# Patient Record
Sex: Male | Born: 1960 | Race: Black or African American | Hispanic: No | Marital: Single | Smoking: Current every day smoker
Health system: Southern US, Community
[De-identification: ages and names within clinical notes are randomized; demographics above are authoritative.]

## PROBLEM LIST (undated history)

## (undated) DIAGNOSIS — J449 Chronic obstructive pulmonary disease, unspecified: Secondary | ICD-10-CM

## (undated) DIAGNOSIS — J45909 Unspecified asthma, uncomplicated: Secondary | ICD-10-CM

## (undated) DIAGNOSIS — M1990 Unspecified osteoarthritis, unspecified site: Secondary | ICD-10-CM

## (undated) DIAGNOSIS — J189 Pneumonia, unspecified organism: Secondary | ICD-10-CM

## (undated) HISTORY — PX: NO PAST SURGERIES: SHX2092

---

## 2013-10-09 ENCOUNTER — Emergency Department: Payer: Self-pay | Admitting: Emergency Medicine

## 2014-12-12 ENCOUNTER — Encounter: Payer: Self-pay | Admitting: Emergency Medicine

## 2014-12-12 ENCOUNTER — Emergency Department
Admission: EM | Admit: 2014-12-12 | Discharge: 2014-12-12 | Disposition: A | Discharge status: Home / Self Care | Payer: Self-pay | Attending: Emergency Medicine | Admitting: Emergency Medicine

## 2014-12-12 DIAGNOSIS — Z720 Tobacco use: Secondary | ICD-10-CM | POA: Insufficient documentation

## 2014-12-12 DIAGNOSIS — M545 Low back pain, unspecified: Secondary | ICD-10-CM

## 2014-12-12 MED ORDER — KETOROLAC TROMETHAMINE 30 MG/ML IJ SOLN
30.0000 mg | Freq: Once | INTRAMUSCULAR | Status: AC
  Administered 2014-12-12: 30 mg via INTRAMUSCULAR

## 2014-12-12 MED ORDER — NAPROXEN 500 MG PO TABS: 500.0000 mg | ORAL_TABLET | Freq: Two times a day (BID) | ORAL | Status: DC

## 2014-12-12 MED ORDER — KETOROLAC TROMETHAMINE 30 MG/ML IJ SOLN
INTRAMUSCULAR | Status: AC
  Administered 2014-12-12: 30 mg via INTRAMUSCULAR
  Filled 2014-12-12: qty 1

## 2014-12-12 MED ORDER — PREDNISONE 50 MG PO TABS: 50.0000 mg | ORAL_TABLET | Freq: Every day | ORAL | Status: DC

## 2014-12-12 NOTE — Discharge Instructions (Signed)
Back Pain, Adult Low back pain is very common. About 1 in 5 people have back pain.The cause of low back pain is rarely dangerous. The pain often gets better over time.About half of people with a sudden onset of back pain feel better in just 2 weeks. About 8 in 10 people feel better by 6 weeks.  CAUSES Some common causes of back pain include:  Strain of the muscles or ligaments supporting the spine.  Wear and tear (degeneration) of the spinal discs.  Arthritis.  Direct injury to the back. DIAGNOSIS Most of the time, the direct cause of low back pain is not known.However, back pain can be treated effectively even when the exact cause of the pain is unknown.Answering your caregiver's questions about your overall health and symptoms is one of the most accurate ways to make sure the cause of your pain is not dangerous. If your caregiver needs more information, he or she may order lab work or imaging tests (X-rays or MRIs).However, even if imaging tests show changes in your back, this usually does not require surgery. HOME CARE INSTRUCTIONS For many people, back pain returns.Since low back pain is rarely dangerous, it is often a condition that people can learn to manageon their own.   Remain active. It is stressful on the back to sit or stand in one place. Do not sit, drive, or stand in one place for more than 30 minutes at a time. Take short walks on level surfaces as soon as pain allows.Try to increase the length of time you walk each day.  Do not stay in bed.Resting more than 1 or 2 days can delay your recovery.  Do not avoid exercise or work.Your body is made to move.It is not dangerous to be active, even though your back may hurt.Your back will likely heal faster if you return to being active before your pain is gone.  Pay attention to your body when you bend and lift. Many people have less discomfortwhen lifting if they bend their knees, keep the load close to their bodies,and  avoid twisting. Often, the most comfortable positions are those that put less stress on your recovering back.  Find a comfortable position to sleep. Use a firm mattress and lie on your side with your knees slightly bent. If you lie on your back, put a pillow under your knees.  Only take over-the-counter or prescription medicines as directed by your caregiver. Over-the-counter medicines to reduce pain and inflammation are often the most helpful.Your caregiver may prescribe muscle relaxant drugs.These medicines help dull your pain so you can more quickly return to your normal activities and healthy exercise.  Put ice on the injured area.  Put ice in a plastic bag.  Place a towel between your skin and the bag.  Leave the ice on for 15-20 minutes, 03-04 times a day for the first 2 to 3 days. After that, ice and heat may be alternated to reduce pain and spasms.  Ask your caregiver about trying back exercises and gentle massage. This may be of some benefit.  Avoid feeling anxious or stressed.Stress increases muscle tension and can worsen back pain.It is important to recognize when you are anxious or stressed and learn ways to manage it.Exercise is a great option. SEEK MEDICAL CARE IF:  You have pain that is not relieved with rest or medicine.  You have pain that does not improve in 1 week.  You have new symptoms.  You are generally not feeling well. SEEK   IMMEDIATE MEDICAL CARE IF:   You have pain that radiates from your back into your legs.  You develop new bowel or bladder control problems.  You have unusual weakness or numbness in your arms or legs.  You develop nausea or vomiting.  You develop abdominal pain.  You feel faint. Document Released: 04/28/2005 Document Revised: 10/28/2011 Document Reviewed: 08/30/2013 ExitCare Patient Information 2015 [employer]. This information is not intended to replace advice given to you by your health care provider. Make sure you  discuss any questions you have with your health care provider.  

## 2014-12-12 NOTE — ED Notes (Signed)
Developed lower back strain at work  [employer] of injury  Ambulates well to treatment room  Dr Corky Downs in with pt on arrival

## 2014-12-12 NOTE — ED Provider Notes (Signed)
[facility] Emergency Department Provider Note  ____________________________________________  Time seen: On arrival  I have reviewed the triage vital signs and the nursing notes.   HISTORY  Chief Complaint Back Pain    HPI Edward Macias is a 54 y.o. male who presents with back pain. 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 He reports the pain is moderate and worse with standing. The pain is in the right lower back just adjacent to the spine. No numbness no weakness. No fevers no chills. No difficult urinating.    History reviewed. No pertinent past medical history.  There are no active problems to display for this patient.   History reviewed. No pertinent past surgical history.  Current Outpatient Rx  Name  Route  Sig  Dispense  Refill  . naproxen (NAPROSYN) 500 MG tablet   Oral   Take 1 tablet (500 mg total) by mouth 2 (two) times daily with a meal.   20 tablet   2   . predniSONE (DELTASONE) 50 MG tablet   Oral   Take 1 tablet (50 mg total) by mouth daily with breakfast.   5 tablet   0     Allergies Review of patient's allergies indicates not on file.  No family history on file.  Social History History  Substance Use Topics  . Smoking status: Current Every Day Smoker -- 1.00 packs/day    Types: Cigarettes  . Smokeless tobacco: Not on file  . Alcohol Use: Yes     Comment: occasionally    Review of Systems  Constitutional: Negative for fever. Eyes: Negative for visual changes.  Genitourinary: Negative for difficulty urinating Musculoskeletal: Positive for back pain Skin: Negative for rash. Neurological: Negative for headaches or focal weakness   ____________________________________________   PHYSICAL EXAM:  VITAL SIGNS: ED Triage Vitals  Enc Vitals Group     BP 12/12/14 0743 114/72 mmHg     Pulse Rate 12/12/14 0743 99     Resp 12/12/14 0743 18     Temp  12/12/14 0743 97.9 F (36.6 C)     Temp Source 12/12/14 0743 Oral     SpO2 12/12/14 0743 96 %     Weight 12/12/14 0739 150 lb (68.04 kg)     Height 12/12/14 0739 5\' 9"  (1.753 m)     Head Cir --      Peak Flow --      Pain Score 12/12/14 0739 8     Pain Loc --      Pain Edu? --      Excl. in [place]? --      Constitutional: Alert and oriented. Well appearing and in no distress. Eyes: Conjunctivae are normal.  ENT   Head: Normocephalic and atraumatic.   Mouth/Throat: Mucous membranes are moist. Cardiovascular: Normal rate, regular rhythm.  Respiratory: Normal respiratory effort without tachypnea nor retractions.  Gastrointestinal: Soft and non-tender in all quadrants. No distention. There is no CVA tenderness. Musculoskeletal: Nontender with normal range of motion in all extremities. No vertebral tenderness to palpation. No erythema or swelling. No muscle spasms. Pain with flexion the patient is able to range his spine completely. Neurologic:  Normal speech and language. Normal strength in the lower extremities. Sensation grossly intact Skin:  Skin is warm, dry and intact. No rash noted. Psychiatric: Mood and affect are normal. Patient exhibits appropriate insight and judgment.  ____________________________________________    LABS (pertinent positives/negatives)  Labs Reviewed - No data to display  ____________________________________________     ____________________________________________    RADIOLOGY I have personally reviewed any xrays that were ordered on this patient: None  ____________________________________________   PROCEDURES  Procedure(s) performed: none   ____________________________________________   INITIAL IMPRESSION / ASSESSMENT AND PLAN / ED COURSE  Pertinent labs & imaging results that were available during my care of the patient were reviewed by me and considered in my medical decision making (see chart for details).  Patient with  likely musculoskeletal back pain. We Edward treat with NSAIDs and a course of steroids. Patient to follow-up with his PCP  ____________________________________________   FINAL CLINICAL IMPRESSION(S) / ED DIAGNOSES  Final diagnoses:  Right-sided low back pain without sciatica     Lavonia Drafts, MD 12/12/14 [id number]

## 2014-12-12 NOTE — ED Notes (Signed)
Patient presents to the ED with lower back pain.  Patient is ambulatory to triage.  Patient states he may have strained his back at work and he is unsure of when and he did not report the injury to management.  Patient states periods of rest increase the back pain.

## 2014-12-13 ENCOUNTER — Ambulatory Visit
Admission: EM | Admit: 2014-12-13 | Discharge: 2014-12-13 | Disposition: A | Discharge status: Home / Self Care | Payer: Worker's Compensation | Attending: Family Medicine | Admitting: Family Medicine

## 2014-12-13 DIAGNOSIS — S39012A Strain of muscle, fascia and tendon of lower back, initial encounter: Secondary | ICD-10-CM

## 2014-12-13 MED ORDER — CYCLOBENZAPRINE HCL 10 MG PO TABS: ORAL_TABLET | ORAL | Status: DC

## 2014-12-13 NOTE — ED Notes (Signed)
Pt states "I have right low back pain since Thursday. 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

## 2014-12-13 NOTE — ED Provider Notes (Signed)
CSN: [id number]     Arrival date & time 12/13/14  1043 History   First MD Initiated Contact with Patient 12/13/14 1151     Chief Complaint  Patient presents with  . Back Pain   (Consider location/radiation/quality/duration/timing/severity/associated sxs/prior Treatment) HPI   This a 54 year old gentleman who presents today because of back pain on his right paralumbar region does not radiate. 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 He states that the following morning didn't hurt badly but over the weekend seem to exacerbate and by Monday morning was unable to get out of bed. He was seen at the emergency department at [facility] yesterday given a Toradol injection and prescription for Naprosyn and prednisone. When he returned to work today with a restriction of no lifting greater than 25 pounds his employer stated that he must be seen here or evaluation before returning to work. He states that his pain is nonradiating. His had back injuries in the past that seemed to improve quickly without any further sequelae. He has filled the Naprosyn prescription but not the prednisone prescription provided yesterday. He states that no tests were obtained yesterday which was verified through medical records.  History reviewed. No pertinent past medical history. History reviewed. No pertinent past surgical history. History reviewed. No pertinent family history. History  Substance Use Topics  . Smoking status: Current Every Day Smoker -- 1.00 packs/day    Types: Cigarettes  . Smokeless tobacco: Not on file  . Alcohol Use: Yes     Comment: occasionally    Review of Systems  Musculoskeletal: Positive for back pain.  All other systems reviewed and are negative.   Allergies  Review of patient's allergies indicates no known allergies.  Home Medications   Prior to Admission medications    Medication Sig Start Date End Date Taking? Authorizing Provider  naproxen (NAPROSYN) 500 MG tablet Take 1 tablet (500 mg total) by mouth 2 (two) times daily with a meal. 12/12/14  Yes Lavonia Drafts, MD  predniSONE (DELTASONE) 50 MG tablet Take 1 tablet (50 mg total) by mouth daily with breakfast. 12/12/14  Yes Lavonia Drafts, MD  cyclobenzaprine (FLEXERIL) 10 MG tablet To be taken at bedtime. 12/13/14   Malachy Chamber Ewelina Naves, PA-C   BP 128/91 mmHg  Pulse 68  Temp(Src) 98 F (36.7 C) (Oral)  Resp 16  Ht 5\' 9"  (1.753 m)  Wt 150 lb (68.04 kg)  BMI 22.14 kg/m2  SpO2 99% Physical Exam  Constitutional: He is oriented to person, place, and time. He appears well-developed and well-nourished.  HENT:  Head: Normocephalic and atraumatic.  Eyes: Pupils are equal, round, and reactive to light.  Neck: Neck supple.  Musculoskeletal:  Patient is a level pelvis in stance. Lumbar range of motion is full with observable muscle spasm in the right paraspinous muscles with right lateral bending. Patient is able to toe and heel walk. EHL peroneal and anterior tibialis is strong to clinical testing. Sensation is intact to the lower extremities bilaterally. Straight leg raise testing is negative in the sitting position to 90 bilaterally DTRs are 2+ over 4 bilaterally in the knee and ankle. There is no CVA tenderness present  Neurological: He is alert and oriented to person, place, and time.  Skin: Skin is warm and dry.  Psychiatric: He has a normal mood and affect. His behavior is normal.  Judgment and thought content normal.  Nursing note and vitals reviewed.   ED Course  Procedures (including critical care time) Labs Review Labs Reviewed - No data to display  Imaging Review No results found.   MDM   1. Strain of lumbar paraspinous muscle, initial encounter    New Prescriptions   CYCLOBENZAPRINE (FLEXERIL) 10 MG TABLET    To be taken at bedtime.   I had a discussion with the patient and agree with the  findings performed by the physician at [facility] emergency department. I've told patient that he should pick up the prescription for the prednisone and take that as prescribed. He'll continue to take his Naprosyn as prescribed. I also added some Flexeril that he can take at nighttime. 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 I'll continue his restrictions of not lifting greater than 25 pounds at work for a total of 2 weeks to allow him to recover from his injury. 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 No formal schedule follow-up will be arranged but he may return any time. He continues to have problems consideration for physical therapy may be beneficial.   Lorin Picket, PA-C 12/13/14 1245

## 2014-12-13 NOTE — Discharge Instructions (Signed)
Back Pain, Adult Low back pain is very common. About 1 in 5 people have back pain.The cause of low back pain is rarely dangerous. The pain often gets better over time.About half of people with a sudden onset of back pain feel better in just 2 weeks. About 8 in 10 people feel better by 6 weeks.  CAUSES Some common causes of back pain include:  Strain of the muscles or ligaments supporting the spine.  Wear and tear (degeneration) of the spinal discs.  Arthritis.  Direct injury to the back. DIAGNOSIS Most of the time, the direct cause of low back pain is not known.However, back pain can be treated effectively even when the exact cause of the pain is unknown.Answering your caregiver's questions about your overall health and symptoms is one of the most accurate ways to make sure the cause of your pain is not dangerous. If your caregiver needs more information, he or she may order lab work or imaging tests (X-rays or MRIs).However, even if imaging tests show changes in your back, this usually does not require surgery. HOME CARE INSTRUCTIONS For many people, back pain returns.Since low back pain is rarely dangerous, it is often a condition that people can learn to manageon their own.   Remain active. It is stressful on the back to sit or stand in one place. Do not sit, drive, or stand in one place for more than 30 minutes at a time. Take short walks on level surfaces as soon as pain allows.Try to increase the length of time you walk each day.  Do not stay in bed.Resting more than 1 or 2 days can delay your recovery.  Do not avoid exercise or work.Your body is made to move.It is not dangerous to be active, even though your back may hurt.Your back will likely heal faster if you return to being active before your pain is gone.  Pay attention to your body when you bend and lift. Many people have less discomfortwhen lifting if they bend their knees, keep the load close to their bodies,and  avoid twisting. Often, the most comfortable positions are those that put less stress on your recovering back.  Find a comfortable position to sleep. Use a firm mattress and lie on your side with your knees slightly bent. If you lie on your back, put a pillow under your knees.  Only take over-the-counter or prescription medicines as directed by your caregiver. Over-the-counter medicines to reduce pain and inflammation are often the most helpful.Your caregiver may prescribe muscle relaxant drugs.These medicines help dull your pain so you can more quickly return to your normal activities and healthy exercise.  Put ice on the injured area.  Put ice in a plastic bag.  Place a towel between your skin and the bag.  Leave the ice on for 15-20 minutes, 03-04 times a day for the first 2 to 3 days. After that, ice and heat may be alternated to reduce pain and spasms.  Ask your caregiver about trying back exercises and gentle massage. This may be of some benefit.  Avoid feeling anxious or stressed.Stress increases muscle tension and can worsen back pain.It is important to recognize when you are anxious or stressed and learn ways to manage it.Exercise is a great option. SEEK MEDICAL CARE IF:  You have pain that is not relieved with rest or medicine.  You have pain that does not improve in 1 week.  You have new symptoms.  You are generally not feeling well. SEEK   IMMEDIATE MEDICAL CARE IF:   You have pain that radiates from your back into your legs.  You develop new bowel or bladder control problems.  You have unusual weakness or numbness in your arms or legs.  You develop nausea or vomiting.  You develop abdominal pain.  You feel faint. Document Released: 04/28/2005 Document Revised: 10/28/2011 Document Reviewed: 08/30/2013 ExitCare Patient Information 2015 [employer]. This information is not intended to replace advice given to you by your health care provider. Make sure you  discuss any questions you have with your health care provider.  

## 2014-12-27 ENCOUNTER — Emergency Department
Admission: EM | Admit: 2014-12-27 | Discharge: 2014-12-27 | Disposition: A | Discharge status: Home / Self Care | Payer: Self-pay | Attending: Emergency Medicine | Admitting: Emergency Medicine

## 2014-12-27 ENCOUNTER — Emergency Department: Payer: Self-pay

## 2014-12-27 ENCOUNTER — Encounter: Payer: Self-pay | Admitting: Emergency Medicine

## 2014-12-27 DIAGNOSIS — Z720 Tobacco use: Secondary | ICD-10-CM | POA: Insufficient documentation

## 2014-12-27 DIAGNOSIS — S300XXA Contusion of lower back and pelvis, initial encounter: Secondary | ICD-10-CM | POA: Insufficient documentation

## 2014-12-27 DIAGNOSIS — Y9289 Other specified places as the place of occurrence of the external cause: Secondary | ICD-10-CM | POA: Insufficient documentation

## 2014-12-27 DIAGNOSIS — S79912A Unspecified injury of left hip, initial encounter: Secondary | ICD-10-CM | POA: Insufficient documentation

## 2014-12-27 DIAGNOSIS — Y998 Other external cause status: Secondary | ICD-10-CM | POA: Insufficient documentation

## 2014-12-27 DIAGNOSIS — W208XXA Other cause of strike by thrown, projected or falling object, initial encounter: Secondary | ICD-10-CM | POA: Insufficient documentation

## 2014-12-27 DIAGNOSIS — Y9389 Activity, other specified: Secondary | ICD-10-CM | POA: Insufficient documentation

## 2014-12-27 MED ORDER — KETOROLAC TROMETHAMINE 60 MG/2ML IM SOLN
60.0000 mg | Freq: Once | INTRAMUSCULAR | Status: AC
  Administered 2014-12-27: 60 mg via INTRAMUSCULAR
  Filled 2014-12-27: qty 2

## 2014-12-27 MED ORDER — METHOCARBAMOL 500 MG PO TABS: 500.0000 mg | ORAL_TABLET | Freq: Four times a day (QID) | ORAL | Status: DC | PRN

## 2014-12-27 MED ORDER — IBUPROFEN 800 MG PO TABS: 800.0000 mg | ORAL_TABLET | Freq: Three times a day (TID) | ORAL | Status: DC | PRN

## 2014-12-27 MED ORDER — HYDROCODONE-ACETAMINOPHEN 5-325 MG PO TABS: 1.0000 | ORAL_TABLET | ORAL | Status: DC | PRN

## 2014-12-27 NOTE — ED Provider Notes (Signed)
[facility] Emergency Department Provider Note  ____________________________________________  Time seen: Approximately 3:49 PM  I have reviewed the triage vital signs and the nursing notes.   HISTORY  Chief Complaint Back Pain    HPI Edward Macias is a 54 y.o. male presents via EMS for complaints of left hip and low back pain. Patient states that he was unloading a right lumbar from A pickup truck when the lawnmower fell on top of him. Denies any abdominal pain no chest pain or shortness or difficulty breathing. Able to stand and ambulate but does so with difficulty.   History reviewed. No pertinent past medical history.  There are no active problems to display for this patient.   History reviewed. No pertinent past surgical history.  Current Outpatient Rx  Name  Route  Sig  Dispense  Refill  . HYDROcodone-acetaminophen (NORCO) 5-325 MG per tablet   Oral   Take 1-2 tablets by mouth every 4 (four) hours as needed for moderate pain.   8 tablet   0   . ibuprofen (ADVIL,MOTRIN) 800 MG tablet   Oral   Take 1 tablet (800 mg total) by mouth every 8 (eight) hours as needed.   30 tablet   0   . methocarbamol (ROBAXIN) 500 MG tablet   Oral   Take 1 tablet (500 mg total) by mouth every 6 (six) hours as needed for muscle spasms.   30 tablet   0     Allergies Review of patient's allergies indicates no known allergies.  History reviewed. No pertinent family history.  Social History Social History  Substance Use Topics  . Smoking status: Current Every Day Smoker -- 1.00 packs/day    Types: Cigarettes  . Smokeless tobacco: None  . Alcohol Use: Yes     Comment: occasionally    Review of Systems Constitutional: No fever/chills Eyes: No visual changes. ENT: No sore throat. Cardiovascular: Denies chest pain. Respiratory: Denies shortness of breath. Gastrointestinal: No abdominal pain.  No nausea, no vomiting.  No diarrhea.  No  constipation. Genitourinary: Negative for dysuria. Musculoskeletal: Positive for left hip pain. Positive for low back sacral coccyx pain. Skin: Negative for rash. Neurological: Negative for headaches, focal weakness or numbness.  10-point ROS otherwise negative.  ____________________________________________   PHYSICAL EXAM:  VITAL SIGNS: ED Triage Vitals  Enc Vitals Group     BP --      Pulse --      Resp --      Temp --      Temp src --      SpO2 --      Weight --      Height --      Head Cir --      Peak Flow --      Pain Score --      Pain Loc --      Pain Edu? --      Excl. in [place]? --     Constitutional: Alert and oriented. Well appearing and in no acute distress. Eyes: Conjunctivae are normal. PERRL. EOMI. Head: Atraumatic. Nose: No congestion/rhinnorhea. Mouth/Throat: Mucous membranes are moist.  Oropharynx non-erythematous. Neck: No stridor.   Cardiovascular: Normal rate, regular rhythm. Grossly normal heart sounds.  Good peripheral circulation. Respiratory: Normal respiratory effort.  No retractions. Lungs CTAB. Gastrointestinal: Soft and nontender. No distention. No abdominal bruits. No CVA tenderness. Musculoskeletal: No lower extremity tenderness nor edema.  No joint effusions. Neurologic:  Normal speech and language. No gross  focal neurologic deficits are appreciated. No gait instability. Skin:  Skin is warm, dry and intact. No rash noted. Psychiatric: Mood and affect are normal. Speech and behavior are normal.  ____________________________________________   LABS (all labs ordered are listed, but only abnormal results are displayed)  Labs Reviewed - No data to display  RADIOLOGY  Left hip and pelvis and sacrum coccyx x-rays interpreted by radiologist and reviewed by myself all reported as negative. ____________________________________________   PROCEDURES  Procedure(s) performed: None  Critical Care performed:  No  ____________________________________________   INITIAL IMPRESSION / ASSESSMENT AND PLAN / ED COURSE  Pertinent labs & imaging results that were available during my care of the patient were reviewed by me and considered in my medical decision making (see chart for details).  Diagnosed with coccyx contusion. Rx given for Motrin 800 mg 3 times a day Norco No. 8 tablets and Robaxin as needed for muscle pain. Patient follow-up with PCP or return to the ER as needed. He voices no other emergency medical complaints at the time of discharge. ____________________________________________   FINAL CLINICAL IMPRESSION(S) / ED DIAGNOSES  Final diagnoses:  Coccyx contusion, initial encounter      Arlyss Repress, PA-C 12/27/14 [place], MD 12/27/14 [phone]

## 2014-12-27 NOTE — Discharge Instructions (Signed)
Tailbone Injury  The tailbone (coccyx) is the small bone at the lower end of the spine. A tailbone injury may involve stretched ligaments, bruising, or a broken bone (fracture). Women are more vulnerable to this injury due to having a wider pelvis.  CAUSES   This type of injury typically occurs from falling and landing on the tailbone. Repeated strain or friction from actions such as rowing and bicycling may also injure the area. The tailbone can be injured during childbirth. Infections or tumors may also press on the tailbone and cause pain. Sometimes, the cause of injury is unknown.  SYMPTOMS    Bruising.   Pain when sitting.   Painful bowel movements.   In women, pain during intercourse.  DIAGNOSIS   Your caregiver can diagnose a tailbone injury based on your symptoms and a physical exam. X-rays may be taken if a fracture is suspected. Your caregiver may also use an MRI scan imaging test to evaluate your symptoms.  TREATMENT   Your caregiver may prescribe medicines to help relieve your pain. Most tailbone injuries heal on their own in 4 to 6 weeks. However, if the injury is caused by an infection or tumor, the recovery period may vary.  PREVENTION   Wear appropriate padding and sports gear when bicycling and rowing. This can help prevent an injury from repeated strain or friction.  HOME CARE INSTRUCTIONS    Put ice on the injured area.   Put ice in a plastic bag.   Place a towel between your skin and the bag.   Leave the ice on for 15-20 minutes, every hour while awake for the first 1 to 2 days.   Sit on a large, rubber or inflated ring or cushion to ease your pain. Lean forward when sitting to help decrease discomfort.   Avoid sitting for long periods of time.   Increase your activity as the pain allows.   Only take over-the-counter or prescription medicines for pain, discomfort, or fever as directed by your caregiver.   You may use stool softeners if it is painful to have a bowel movement, or as  directed by your caregiver.   Eat a diet with plenty of fiber to help prevent constipation.   Keep all follow-up appointments as directed by your caregiver.  SEEK MEDICAL CARE IF:    Your pain becomes worse.   Your bowel movements cause a great deal of discomfort.   You are unable to have a bowel movement.   You have a fever.  MAKE SURE YOU:   Understand these instructions.   Will watch your condition.   Will get help right away if you are not doing well or get worse.  Document Released: 04/25/2000 Document Revised: 07/21/2011 Document Reviewed: 11/21/2010  ExitCare Patient Information 2015 [employer]. This information is not intended to replace advice given to you by your health care provider. Make sure you discuss any questions you have with your health care provider.

## 2014-12-27 NOTE — ED Notes (Signed)
Pt arrived via EMS from home, was unloading a riding lawnmower off the back of a pickup truck this morning around 0900-0930. Lawnmower started to fall and patient fell back onto his butt. He also thinks the back wheel hit him on his backside as well.  No other pain at this time.  Took Tylenol this morning without relief.  Ambulatory without assistance but c/o pain. Pain is 10/10.

## 2016-09-26 ENCOUNTER — Encounter
Admission: RE | Admit: 2016-09-26 | Discharge: 2016-09-26 | Disposition: A | Discharge status: Home / Self Care | Payer: Self-pay | Source: Ambulatory Visit | Attending: Surgery | Admitting: Surgery

## 2016-09-26 HISTORY — DX: Unspecified osteoarthritis, unspecified site: M19.90

## 2016-09-26 HISTORY — DX: Unspecified asthma, uncomplicated: J45.909

## 2016-09-26 NOTE — Patient Instructions (Signed)
  Your procedure is scheduled on: 10-03-16 Report to Same Day Surgery 2nd floor medical mall [facility] Entrance-take elevator on left to 2nd floor.  Check in with surgery information desk.) To find out your arrival time please call [phone] between 1PM - 3PM on 10-02-16  Remember: Instructions that are not followed completely may result in serious medical risk, up to and including death, or upon the discretion of your surgeon and anesthesiologist your surgery may need to be rescheduled.    _x___ 1. Do not eat food or drink liquids after midnight. No gum chewing or hard candies.     __x__ 2. No Alcohol for 24 hours before or after surgery.   __x__3. No Smoking for 24 prior to surgery.   ____  4. Bring all medications with you on the day of surgery if instructed.    __x__ 5. Notify your doctor if there is any change in your medical condition     (cold, fever, infections).     Do not wear jewelry, make-up, hairpins, clips or nail polish.  Do not wear lotions, powders, or perfumes. You may wear deodorant.  Do not shave 48 hours prior to surgery. Men may shave face and neck.  Do not bring valuables to the hospital.    [facility] is not responsible for any belongings or valuables.               Contacts, dentures or bridgework may not be worn into surgery.  Leave your suitcase in the car. After surgery it may be brought to your room.  For patients admitted to the hospital, discharge time is determined by your treatment team.   Patients discharged the day of surgery will not be allowed to drive home.  You will need someone to drive you home and stay with you the night of your procedure.    Please read over the following fact sheets that you were given:    ____ Take anti-hypertensive (unless it includes a diuretic), cardiac, seizure, asthma,     anti-reflux and psychiatric medicines. These include:  1. NONE  2.  3.  4.  5.  6.  ____Fleets enema or Magnesium Citrate as  directed.   ____ Use CHG Soap or sage wipes as directed on instruction sheet   ____ Use inhalers on the day of surgery and bring to hospital day of surgery  ____ Stop Metformin and Janumet 2 days prior to surgery.    ____ Take 1/2 of usual insulin dose the night before surgery and none on the morning     surgery.   ____ Follow recommendations from Cardiologist, Pulmonologist or PCP regarding stopping Aspirin, Coumadin, Pllavix ,Eliquis, Effient, or Pradaxa, and Pletal.  X____Stop Anti-inflammatories such as Advil, Aleve, IBUPROFEN, Motrin, Naproxen, Naprosyn, Goodies powders or aspirin products NOW-OK to take Tylenol .   ____ Stop supplements until after surgery   ____ Bring C-Pap to the hospital.

## 2016-10-02 MED ORDER — CEFAZOLIN SODIUM-DEXTROSE 2-4 GM/100ML-% IV SOLN
2.0000 g | Freq: Once | INTRAVENOUS | Status: AC
  Administered 2016-10-03: 2 g via INTRAVENOUS

## 2016-10-03 ENCOUNTER — Ambulatory Visit
Admission: RE | Admit: 2016-10-03 | Discharge: 2016-10-03 | Disposition: A | Discharge status: Home / Self Care | Payer: Self-pay | Source: Ambulatory Visit | Attending: Surgery | Admitting: Surgery

## 2016-10-03 ENCOUNTER — Ambulatory Visit: Payer: Self-pay | Admitting: Certified Registered Nurse Anesthetist

## 2016-10-03 ENCOUNTER — Encounter: Payer: Self-pay | Admitting: *Deleted

## 2016-10-03 ENCOUNTER — Encounter
Admission: RE | Disposition: A | Discharge status: Home / Self Care | Payer: Self-pay | Source: Ambulatory Visit | Attending: Surgery

## 2016-10-03 DIAGNOSIS — J45909 Unspecified asthma, uncomplicated: Secondary | ICD-10-CM | POA: Insufficient documentation

## 2016-10-03 DIAGNOSIS — D176 Benign lipomatous neoplasm of spermatic cord: Secondary | ICD-10-CM | POA: Insufficient documentation

## 2016-10-03 DIAGNOSIS — Z79899 Other long term (current) drug therapy: Secondary | ICD-10-CM | POA: Insufficient documentation

## 2016-10-03 DIAGNOSIS — M1990 Unspecified osteoarthritis, unspecified site: Secondary | ICD-10-CM | POA: Insufficient documentation

## 2016-10-03 DIAGNOSIS — K4090 Unilateral inguinal hernia, without obstruction or gangrene, not specified as recurrent: Secondary | ICD-10-CM | POA: Insufficient documentation

## 2016-10-03 HISTORY — PX: INGUINAL HERNIA REPAIR: SHX194

## 2016-10-03 SURGERY — REPAIR, HERNIA, INGUINAL, ADULT
Anesthesia: General | Site: Groin | Laterality: Left | Wound class: Clean

## 2016-10-03 MED ORDER — FAMOTIDINE 20 MG PO TABS
20.0000 mg | ORAL_TABLET | Freq: Once | ORAL | Status: AC
  Administered 2016-10-03: 20 mg via ORAL

## 2016-10-03 MED ORDER — HYDROCODONE-ACETAMINOPHEN 5-325 MG PO TABS: 1.0000 | ORAL_TABLET | ORAL | 0 refills | Status: DC | PRN

## 2016-10-03 MED ORDER — ROCURONIUM BROMIDE 100 MG/10ML IV SOLN
INTRAVENOUS | Status: DC | PRN
  Administered 2016-10-03: 50 mg via INTRAVENOUS

## 2016-10-03 MED ORDER — FENTANYL CITRATE (PF) 100 MCG/2ML IJ SOLN
INTRAMUSCULAR | Status: AC
  Filled 2016-10-03: qty 2

## 2016-10-03 MED ORDER — SUGAMMADEX SODIUM 200 MG/2ML IV SOLN
INTRAVENOUS | Status: DC | PRN
  Administered 2016-10-03: 150 mg via INTRAVENOUS

## 2016-10-03 MED ORDER — OXYCODONE HCL 5 MG PO TABS: 5.0000 mg | ORAL_TABLET | Freq: Once | ORAL | Status: DC | PRN

## 2016-10-03 MED ORDER — DEXAMETHASONE SODIUM PHOSPHATE 10 MG/ML IJ SOLN
INTRAMUSCULAR | Status: AC
  Filled 2016-10-03: qty 1

## 2016-10-03 MED ORDER — OXYCODONE HCL 5 MG/5ML PO SOLN: 5.0000 mg | Freq: Once | ORAL | Status: DC | PRN

## 2016-10-03 MED ORDER — MIDAZOLAM HCL 2 MG/2ML IJ SOLN
INTRAMUSCULAR | Status: DC | PRN
  Administered 2016-10-03: 2 mg via INTRAVENOUS

## 2016-10-03 MED ORDER — ONDANSETRON HCL 4 MG/2ML IJ SOLN
INTRAMUSCULAR | Status: AC
  Filled 2016-10-03: qty 2

## 2016-10-03 MED ORDER — ONDANSETRON HCL 4 MG/2ML IJ SOLN
INTRAMUSCULAR | Status: DC | PRN
  Administered 2016-10-03: 4 mg via INTRAVENOUS

## 2016-10-03 MED ORDER — LIDOCAINE HCL (PF) 2 % IJ SOLN
INTRAMUSCULAR | Status: AC
  Filled 2016-10-03: qty 2

## 2016-10-03 MED ORDER — CEFAZOLIN SODIUM-DEXTROSE 2-4 GM/100ML-% IV SOLN
INTRAVENOUS | Status: AC
  Filled 2016-10-03: qty 100

## 2016-10-03 MED ORDER — FAMOTIDINE 20 MG PO TABS
ORAL_TABLET | ORAL | Status: AC
  Administered 2016-10-03: 20 mg via ORAL
  Filled 2016-10-03: qty 1

## 2016-10-03 MED ORDER — LACTATED RINGERS IV SOLN
INTRAVENOUS | Status: DC
  Administered 2016-10-03: 11:00:00 via INTRAVENOUS

## 2016-10-03 MED ORDER — ROCURONIUM BROMIDE 50 MG/5ML IV SOLN
INTRAVENOUS | Status: AC
  Filled 2016-10-03: qty 1

## 2016-10-03 MED ORDER — EPHEDRINE SULFATE 50 MG/ML IJ SOLN
INTRAMUSCULAR | Status: AC
  Filled 2016-10-03: qty 1

## 2016-10-03 MED ORDER — SUGAMMADEX SODIUM 200 MG/2ML IV SOLN
INTRAVENOUS | Status: AC
  Filled 2016-10-03: qty 2

## 2016-10-03 MED ORDER — EPHEDRINE SULFATE 50 MG/ML IJ SOLN
INTRAMUSCULAR | Status: DC | PRN
  Administered 2016-10-03: 10 mg via INTRAVENOUS

## 2016-10-03 MED ORDER — LIDOCAINE HCL (CARDIAC) 20 MG/ML IV SOLN
INTRAVENOUS | Status: DC | PRN
  Administered 2016-10-03: 60 mg via INTRAVENOUS

## 2016-10-03 MED ORDER — DEXAMETHASONE SODIUM PHOSPHATE 10 MG/ML IJ SOLN
INTRAMUSCULAR | Status: DC | PRN
  Administered 2016-10-03: 6 mg via INTRAVENOUS

## 2016-10-03 MED ORDER — PROPOFOL 10 MG/ML IV BOLUS
INTRAVENOUS | Status: AC
  Filled 2016-10-03: qty 20

## 2016-10-03 MED ORDER — PROMETHAZINE HCL 25 MG/ML IJ SOLN: 6.2500 mg | INTRAMUSCULAR | Status: DC | PRN

## 2016-10-03 MED ORDER — FENTANYL CITRATE (PF) 100 MCG/2ML IJ SOLN
INTRAMUSCULAR | Status: DC | PRN
  Administered 2016-10-03 (×3): 50 ug via INTRAVENOUS

## 2016-10-03 MED ORDER — FENTANYL CITRATE (PF) 100 MCG/2ML IJ SOLN
INTRAMUSCULAR | Status: AC
  Administered 2016-10-03: 25 ug via INTRAVENOUS
  Filled 2016-10-03: qty 2

## 2016-10-03 MED ORDER — HYDROCODONE-ACETAMINOPHEN 5-325 MG PO TABS
ORAL_TABLET | ORAL | Status: AC
  Filled 2016-10-03: qty 1

## 2016-10-03 MED ORDER — MEPERIDINE HCL 50 MG/ML IJ SOLN: 6.2500 mg | INTRAMUSCULAR | Status: DC | PRN

## 2016-10-03 MED ORDER — MIDAZOLAM HCL 2 MG/2ML IJ SOLN
INTRAMUSCULAR | Status: AC
  Filled 2016-10-03: qty 2

## 2016-10-03 MED ORDER — FENTANYL CITRATE (PF) 100 MCG/2ML IJ SOLN
25.0000 ug | INTRAMUSCULAR | Status: DC | PRN
  Administered 2016-10-03 (×4): 25 ug via INTRAVENOUS

## 2016-10-03 MED ORDER — BUPIVACAINE-EPINEPHRINE (PF) 0.5% -1:200000 IJ SOLN
INTRAMUSCULAR | Status: DC | PRN
  Administered 2016-10-03: 15 mL via PERINEURAL

## 2016-10-03 MED ORDER — HYDROCODONE-ACETAMINOPHEN 5-325 MG PO TABS
1.0000 | ORAL_TABLET | ORAL | Status: DC | PRN
  Administered 2016-10-03: 1 via ORAL

## 2016-10-03 MED ORDER — PROPOFOL 10 MG/ML IV BOLUS
INTRAVENOUS | Status: DC | PRN
  Administered 2016-10-03: 130 mg via INTRAVENOUS

## 2016-10-03 SURGICAL SUPPLY — 26 items
BLADE SURG 15 STRL LF DISP TIS (BLADE) ×1 IMPLANT
BLADE SURG 15 STRL SS (BLADE) ×2
CANISTER SUCT 1200ML W/VALVE (MISCELLANEOUS) ×3 IMPLANT
CHLORAPREP W/TINT 26ML (MISCELLANEOUS) ×3 IMPLANT
DERMABOND ADVANCED (GAUZE/BANDAGES/DRESSINGS) ×2
DERMABOND ADVANCED .7 DNX12 (GAUZE/BANDAGES/DRESSINGS) ×1 IMPLANT
DRAIN PENROSE 5/8X18 LTX STRL (WOUND CARE) ×3 IMPLANT
DRAPE LAPAROTOMY 77X122 PED (DRAPES) ×3 IMPLANT
ELECT REM PT RETURN 9FT ADLT (ELECTROSURGICAL) ×3
ELECTRODE REM PT RTRN 9FT ADLT (ELECTROSURGICAL) ×1 IMPLANT
GLOVE BIO SURGEON STRL SZ7.5 (GLOVE) ×3 IMPLANT
GOWN STRL REUS W/ TWL LRG LVL3 (GOWN DISPOSABLE) ×3 IMPLANT
GOWN STRL REUS W/TWL LRG LVL3 (GOWN DISPOSABLE) ×6
KIT RM TURNOVER STRD PROC AR (KITS) ×3 IMPLANT
LABEL OR SOLS (LABEL) ×3 IMPLANT
MESH SYNTHETIC 4X6 SOFT BARD (Mesh General) ×1 IMPLANT
MESH SYNTHETIC SOFT BARD 4X6 (Mesh General) ×2 IMPLANT
NEEDLE HYPO 25X1 1.5 SAFETY (NEEDLE) ×3 IMPLANT
NS IRRIG 500ML POUR BTL (IV SOLUTION) ×3 IMPLANT
PACK BASIN MINOR ARMC (MISCELLANEOUS) ×3 IMPLANT
SUT CHROMIC 4 0 RB 1X27 (SUTURE) ×3 IMPLANT
SUT MNCRL AB 4-0 PS2 18 (SUTURE) ×3 IMPLANT
SUT SURGILON 0 30 BLK (SUTURE) ×9 IMPLANT
SUT VIC AB 4-0 SH 27 (SUTURE) ×4
SUT VIC AB 4-0 SH 27XANBCTRL (SUTURE) ×2 IMPLANT
SYRINGE 10CC LL (SYRINGE) ×3 IMPLANT

## 2016-10-03 NOTE — H&P (Signed)
  He  reports no change in condition since office exam.  Labs noted.  Left side marked YES.  Discussed plan for surgery.

## 2016-10-03 NOTE — Anesthesia Postprocedure Evaluation (Signed)
Anesthesia Post Note  Patient: Edward Macias  Procedure(s) Performed: Procedure(s) (LRB): HERNIA REPAIR INGUINAL ADULT (Left)  Patient location during evaluation: PACU Anesthesia Type: General Level of consciousness: awake and alert and oriented Pain management: pain level controlled Vital Signs Assessment: post-procedure vital signs reviewed and stable Respiratory status: spontaneous breathing, nonlabored ventilation and respiratory function stable Cardiovascular status: blood pressure returned to baseline and stable Postop Assessment: no signs of nausea or vomiting Anesthetic complications: no     Last Vitals:  Vitals:   10/03/16 1351 10/03/16 1406  BP: (!) 134/95 128/84  Pulse: 71 76  Resp: 16 15  Temp:  36.7 C    Last Pain:  Vitals:   10/03/16 1406  TempSrc:   PainSc: 2                  Tyishia Aune

## 2016-10-03 NOTE — Anesthesia Post-op Follow-up Note (Cosign Needed)
Anesthesia QCDR form completed.        

## 2016-10-03 NOTE — Transfer of Care (Signed)
Immediate Anesthesia Transfer of Care Note  Patient: Edward Macias  Procedure(s) Performed: Procedure(s): HERNIA REPAIR INGUINAL ADULT (Left)  Patient Location: PACU  Anesthesia Type:General  Level of Consciousness: responds to stimulation  Airway & Oxygen Therapy: Patient Spontanous Breathing and Patient connected to nasal cannula oxygen  Post-op Assessment: Report given to RN and Post -op Vital signs reviewed and stable  Post vital signs: Reviewed and stable  Last Vitals:  Vitals:   10/03/16 1058 10/03/16 1306  BP: 134/84 (!) 161/92  Pulse: 73 80  Resp: 18 15  Temp: 36.8 C 36.5 C    Last Pain:  Vitals:   10/03/16 1306  TempSrc: Temporal  PainSc: Asleep         Complications: No apparent anesthesia complications

## 2016-10-03 NOTE — Anesthesia Procedure Notes (Signed)
Procedure Name: Intubation Date/Time: 10/03/2016 11:24 AM Performed by: Darlyne Russian Pre-anesthesia Checklist: Patient identified, Emergency Drugs available, Suction available, Patient being monitored and Timeout performed Patient Re-evaluated:Patient Re-evaluated prior to inductionOxygen Delivery Method: Circle system utilized Preoxygenation: Pre-oxygenation with 100% oxygen Intubation Type: IV induction Ventilation: Mask ventilation without difficulty Laryngoscope Size: Mac and 4 Grade View: Grade I Tube size: 7.5 mm Number of attempts: 1 Airway Equipment and Method: Stylet (Soft bite block placed between left molars.) Placement Confirmation: ETT inserted through vocal cords under direct vision,  positive ETCO2 and breath sounds checked- equal and bilateral Secured at: 23 cm Tube secured with: Tape Dental Injury: Teeth and Oropharynx as per pre-operative assessment

## 2016-10-03 NOTE — Anesthesia Preprocedure Evaluation (Signed)
Anesthesia Evaluation  Patient identified by MRN, date of birth, ID band Patient awake    Reviewed: Allergy & Precautions, NPO status , Patient's Chart, lab work & pertinent test results  History of Anesthesia Complications Negative for: history of anesthetic complications  Airway Mallampati: I  TM Distance: >3 FB Neck ROM: Full    Dental  (+) Missing, Poor Dentition   Pulmonary neg sleep apnea, neg COPD, Current Smoker,    breath sounds clear to auscultation- rhonchi (-) wheezing      Cardiovascular Exercise Tolerance: Good (-) hypertension(-) CAD and (-) Past MI  Rhythm:Regular Rate:Normal - Systolic murmurs and - Diastolic murmurs    Neuro/Psych negative neurological ROS  negative psych ROS   GI/Hepatic negative GI ROS, Neg liver ROS,   Endo/Other  negative endocrine ROSneg diabetes  Renal/GU negative Renal ROS     Musculoskeletal  (+) Arthritis ,   Abdominal (+) - obese,   Peds  Hematology negative hematology ROS (+)   Anesthesia Other Findings Past Medical History: No date: Arthritis No date: Asthma     Comment: AS A CHILD-NO INHALERS   Reproductive/Obstetrics                             Anesthesia Physical Anesthesia Plan  ASA: II  Anesthesia Plan: General   Post-op Pain Management:    Induction: Intravenous  Airway Management Planned: Oral ETT  Additional Equipment:   Intra-op Plan:   Post-operative Plan: Extubation in OR  Informed Consent: I have reviewed the patients History and Physical, chart, labs and discussed the procedure including the risks, benefits and alternatives for the proposed anesthesia with the patient or authorized representative who has indicated his/her understanding and acceptance.   Dental advisory given  Plan Discussed with: CRNA and Anesthesiologist  Anesthesia Plan Comments:         Anesthesia Quick Evaluation

## 2016-10-03 NOTE — Discharge Instructions (Addendum)
Take Tylenol or Norco if needed for pain.  Should not drive or do anything dangerous when taking Norco.  Avoid straining and heavy lifting for the first month after surgery.  May shower.  AMBULATORY SURGERY  DISCHARGE INSTRUCTIONS   1) The drugs that you were given will stay in your system until tomorrow so for the next 24 hours you should not:  A) Drive an automobile B) Make any legal decisions C) Drink any alcoholic beverage   2) You may resume regular meals tomorrow.  Today it is better to start with liquids and gradually work up to solid foods.  You may eat anything you prefer, but it is better to start with liquids, then soup and crackers, and gradually work up to solid foods.   3) Please notify your doctor immediately if you have any unusual bleeding, trouble breathing, redness and pain at the surgery site, drainage, fever, or pain not relieved by medication.    4) Additional Instructions:  Please contact your physician with any problems or Same Day Surgery at [phone], Monday through Friday 6 am to 4 pm, or [place] at [facility] number at [phone].

## 2016-10-03 NOTE — Op Note (Signed)
OPERATIVE REPORT  PREOPERATIVE DIAGNOSIS: left inguinal hernia  POSTOPERATIVE DIAGNOSIS:left  inguinal hernia  PROCEDURE:  left inguinal hernia repair  ANESTHESIA:  General  SURGEON:  Rochel Brome M.D.  INDICATIONS: He reports recent bulging in the left groin. A left inguinal hernia was demonstrated on physical exam and repair was recommended for definitive treatment.   With the patient on the operating table in the supine position the left lower quadrant was prepared with clippers and with ChloraPrep and draped in a sterile manner. A transversely oriented suprapubic incision was made and carried down through subcutaneous tissues. Electrocautery was used for hemostasis. The Scarpa's fascia was incised. The external oblique aponeurosis was incised along the course of its fibers to open the external ring and expose the inguinal cord structures. The cord structures were mobilized. A Penrose drain was passed around the cord structures for traction. Cremaster fibers were spread to expose an indirect hernia sac which was dissected free from surrounding structures. A high ligation of the sac was done with a 4-0 Vicryl suture ligature and the sac was amputated and was not submitted for pathology. It was also a small cord lipoma dissected free from surrounding structures and ligated with 4-0 Vicryl and amputated and not sent for pathology. The repair was carried out with a roll of 0 Surgilon sutures suturing the conjoined tendon to the shelving edge of the inguinal ligament incorporating transversalis fascia into the repair. The last stitch led to satisfactory narrowing of the internal ring. Bard soft mesh was cut to create an oval shape and was placed over the repair. This was sutured to the repair with interrupted 0 Surgilon sutures and also sutured medially to the deep fascia and on both sides of the internal ring. Next after seeing hemostasis was intact the cord structures were replaced along the floor of  the inguinal canal. The cut edges of the external oblique aponeurosis were closed with a running 4-0 Vicryl suture to re-create the external ring. The deep fascia superior and lateral to the repair site was infiltrated with half percent Sensorcaine with epinephrine. Subcutaneous tissues were also infiltrated. The Scarpa's fascia was closed with interrupted 4-0 Vicryl sutures. The skin was closed with running 4-0 Monocryl subcuticular suture and Dermabond. The testicle remained in the scrotum  The patient appeared to be in satisfactory condition and was prepared for transfer to the recovery room.  Rochel Brome M.D.

## 2016-10-04 ENCOUNTER — Encounter: Payer: Self-pay | Admitting: Surgery

## 2016-10-07 ENCOUNTER — Encounter: Payer: Self-pay | Admitting: Surgery

## 2018-07-24 ENCOUNTER — Other Ambulatory Visit: Payer: Self-pay

## 2018-07-24 ENCOUNTER — Encounter: Payer: Self-pay | Admitting: Emergency Medicine

## 2018-07-24 ENCOUNTER — Inpatient Hospital Stay
Admission: EM | Admit: 2018-07-24 | Discharge: 2018-07-27 | DRG: 871 | Disposition: A | Discharge status: Home / Self Care | Payer: PRIVATE HEALTH INSURANCE | Attending: Internal Medicine | Admitting: Internal Medicine

## 2018-07-24 ENCOUNTER — Emergency Department: Payer: PRIVATE HEALTH INSURANCE

## 2018-07-24 DIAGNOSIS — A419 Sepsis, unspecified organism: Principal | ICD-10-CM | POA: Diagnosis present

## 2018-07-24 DIAGNOSIS — Z7951 Long term (current) use of inhaled steroids: Secondary | ICD-10-CM

## 2018-07-24 DIAGNOSIS — B953 Streptococcus pneumoniae as the cause of diseases classified elsewhere: Secondary | ICD-10-CM | POA: Diagnosis present

## 2018-07-24 DIAGNOSIS — G629 Polyneuropathy, unspecified: Secondary | ICD-10-CM | POA: Diagnosis present

## 2018-07-24 DIAGNOSIS — Z720 Tobacco use: Secondary | ICD-10-CM | POA: Diagnosis not present

## 2018-07-24 DIAGNOSIS — E871 Hypo-osmolality and hyponatremia: Secondary | ICD-10-CM | POA: Diagnosis present

## 2018-07-24 DIAGNOSIS — Z79891 Long term (current) use of opiate analgesic: Secondary | ICD-10-CM | POA: Diagnosis not present

## 2018-07-24 DIAGNOSIS — J441 Chronic obstructive pulmonary disease with (acute) exacerbation: Secondary | ICD-10-CM | POA: Diagnosis present

## 2018-07-24 DIAGNOSIS — Z79899 Other long term (current) drug therapy: Secondary | ICD-10-CM | POA: Diagnosis not present

## 2018-07-24 DIAGNOSIS — J189 Pneumonia, unspecified organism: Secondary | ICD-10-CM | POA: Diagnosis not present

## 2018-07-24 DIAGNOSIS — J9601 Acute respiratory failure with hypoxia: Secondary | ICD-10-CM | POA: Diagnosis present

## 2018-07-24 DIAGNOSIS — F17210 Nicotine dependence, cigarettes, uncomplicated: Secondary | ICD-10-CM | POA: Diagnosis present

## 2018-07-24 DIAGNOSIS — Z791 Long term (current) use of non-steroidal anti-inflammatories (NSAID): Secondary | ICD-10-CM | POA: Diagnosis not present

## 2018-07-24 DIAGNOSIS — D72819 Decreased white blood cell count, unspecified: Secondary | ICD-10-CM | POA: Diagnosis not present

## 2018-07-24 DIAGNOSIS — J440 Chronic obstructive pulmonary disease with acute lower respiratory infection: Secondary | ICD-10-CM | POA: Diagnosis present

## 2018-07-24 HISTORY — DX: Sepsis, unspecified organism: A41.9

## 2018-07-24 LAB — COMPREHENSIVE METABOLIC PANEL
ALK PHOS: 28 U/L — AB (ref 38–126)
ALT: 30 U/L (ref 0–44)
AST: 48 U/L — AB (ref 15–41)
Albumin: 3.7 g/dL (ref 3.5–5.0)
Anion gap: 15 (ref 5–15)
BUN: 21 mg/dL — AB (ref 6–20)
CALCIUM: 8.3 mg/dL — AB (ref 8.9–10.3)
CO2: 25 mmol/L (ref 22–32)
CREATININE: 1.03 mg/dL (ref 0.61–1.24)
Chloride: 91 mmol/L — ABNORMAL LOW (ref 98–111)
GFR calc non Af Amer: >60 mL/min (ref >60)
GLUCOSE: 102 mg/dL — AB (ref 70–99)
Potassium: 3.7 mmol/L (ref 3.5–5.1)
SODIUM: 131 mmol/L — AB (ref 135–145)
Total Bilirubin: 2.1 mg/dL — ABNORMAL HIGH (ref 0.3–1.2)
Total Protein: 8.1 g/dL (ref 6.5–8.1)

## 2018-07-24 LAB — CBC WITH DIFFERENTIAL/PLATELET
ABS IMMATURE GRANULOCYTES: 0.01 10*3/uL (ref 0.00–0.07)
Basophils Absolute: 0 10*3/uL (ref 0.0–0.1)
Basophils Relative: 1 %
EOS ABS: 0 10*3/uL (ref 0.0–0.5)
Eosinophils Relative: 0 %
HCT: 48.8 % (ref 39.0–52.0)
HEMOGLOBIN: 17.2 g/dL — AB (ref 13.0–17.0)
IMMATURE GRANULOCYTES: 1 %
LYMPHS ABS: 0.5 10*3/uL — AB (ref 0.7–4.0)
Lymphocytes Relative: 30 %
MCH: 33.3 pg (ref 26.0–34.0)
MCHC: 35.2 g/dL (ref 30.0–36.0)
MCV: 94.6 fL (ref 80.0–100.0)
MONO ABS: 0.1 10*3/uL (ref 0.1–1.0)
MONOS PCT: 7 %
Neutro Abs: 1 10*3/uL — ABNORMAL LOW (ref 1.7–7.7)
Neutrophils Relative %: 61 %
PLATELETS: 182 10*3/uL (ref 150–400)
RBC: 5.16 MIL/uL (ref 4.22–5.81)
RDW: 13.9 % (ref 11.5–15.5)
Smear Review: NORMAL
WBC: 1.6 10*3/uL — ABNORMAL LOW (ref 4.0–10.5)
nRBC: 0 % (ref 0.0–0.2)

## 2018-07-24 LAB — INFLUENZA PANEL BY PCR (TYPE A & B)
Influenza A By PCR: NEGATIVE
Influenza B By PCR: NEGATIVE

## 2018-07-24 LAB — LACTIC ACID, PLASMA
Lactic Acid, Venous: 3.4 mmol/L (ref 0.5–1.9)
Lactic Acid, Venous: 4.1 mmol/L (ref 0.5–1.9)

## 2018-07-24 MED ORDER — AZITHROMYCIN 500 MG PO TABS: 500.0000 mg | ORAL_TABLET | ORAL | Status: DC

## 2018-07-24 MED ORDER — ACETAMINOPHEN 325 MG PO TABS
650.0000 mg | ORAL_TABLET | Freq: Four times a day (QID) | ORAL | Status: DC | PRN
  Administered 2018-07-24 – 2018-07-25 (×3): 650 mg via ORAL
  Filled 2018-07-24 (×3): qty 2

## 2018-07-24 MED ORDER — VANCOMYCIN HCL 10 G IV SOLR: 1500.0000 mg | Freq: Two times a day (BID) | INTRAVENOUS | Status: DC

## 2018-07-24 MED ORDER — PIPERACILLIN-TAZOBACTAM 3.375 G IVPB 30 MIN: 3.3750 g | Freq: Three times a day (TID) | INTRAVENOUS | Status: DC

## 2018-07-24 MED ORDER — SODIUM CHLORIDE 0.9 % IV SOLN
INTRAVENOUS | Status: DC
  Administered 2018-07-24 – 2018-07-27 (×7): via INTRAVENOUS

## 2018-07-24 MED ORDER — PIPERACILLIN-TAZOBACTAM 3.375 G IVPB
3.3750 g | Freq: Three times a day (TID) | INTRAVENOUS | Status: DC
  Administered 2018-07-25 (×3): 3.375 g via INTRAVENOUS
  Filled 2018-07-24 (×3): qty 50

## 2018-07-24 MED ORDER — VANCOMYCIN HCL 10 G IV SOLR
2000.0000 mg | Freq: Once | INTRAVENOUS | Status: AC
  Administered 2018-07-25: 2000 mg via INTRAVENOUS
  Filled 2018-07-24: qty 2000

## 2018-07-24 MED ORDER — DOCUSATE SODIUM 100 MG PO CAPS
100.0000 mg | ORAL_CAPSULE | Freq: Two times a day (BID) | ORAL | Status: DC
  Administered 2018-07-27: 100 mg via ORAL
  Filled 2018-07-24 (×6): qty 1

## 2018-07-24 MED ORDER — LORATADINE 10 MG PO TABS
10.0000 mg | ORAL_TABLET | Freq: Every day | ORAL | Status: DC | PRN
Start: 2018-07-24 — End: 2018-07-27
  Administered 2018-07-26: 10 mg via ORAL
  Filled 2018-07-24: qty 1

## 2018-07-24 MED ORDER — ONDANSETRON HCL 4 MG PO TABS: 4.0000 mg | ORAL_TABLET | Freq: Four times a day (QID) | ORAL | Status: DC | PRN

## 2018-07-24 MED ORDER — BISACODYL 5 MG PO TBEC: 5.0000 mg | DELAYED_RELEASE_TABLET | Freq: Every day | ORAL | Status: DC | PRN

## 2018-07-24 MED ORDER — LEVALBUTEROL HCL 1.25 MG/0.5ML IN NEBU
1.2500 mg | INHALATION_SOLUTION | Freq: Four times a day (QID) | RESPIRATORY_TRACT | Status: DC
  Administered 2018-07-24: 1.25 mg via RESPIRATORY_TRACT
  Filled 2018-07-24 (×3): qty 0.5

## 2018-07-24 MED ORDER — SODIUM CHLORIDE 0.9 % IV BOLUS (SEPSIS)
250.0000 mL | Freq: Once | INTRAVENOUS | Status: AC
  Administered 2018-07-24: 250 mL via INTRAVENOUS

## 2018-07-24 MED ORDER — CEFTRIAXONE SODIUM 1 G IJ SOLR
1.0000 g | Freq: Once | INTRAMUSCULAR | Status: AC
  Administered 2018-07-24: 1 g via INTRAVENOUS
  Filled 2018-07-24: qty 10

## 2018-07-24 MED ORDER — SODIUM CHLORIDE 0.9 % IV BOLUS (SEPSIS)
1000.0000 mL | Freq: Once | INTRAVENOUS | Status: AC
  Administered 2018-07-24: 1000 mL via INTRAVENOUS

## 2018-07-24 MED ORDER — VANCOMYCIN HCL IN DEXTROSE 750-5 MG/150ML-% IV SOLN
750.0000 mg | Freq: Two times a day (BID) | INTRAVENOUS | Status: DC
  Administered 2018-07-25 – 2018-07-26 (×3): 750 mg via INTRAVENOUS
  Filled 2018-07-24 (×4): qty 150

## 2018-07-24 MED ORDER — SODIUM CHLORIDE 0.9 % IV SOLN
1000.0000 mL | Freq: Once | INTRAVENOUS | Status: AC
  Administered 2018-07-24: 1000 mL via INTRAVENOUS

## 2018-07-24 MED ORDER — ACETAMINOPHEN 650 MG RE SUPP: 650.0000 mg | Freq: Four times a day (QID) | RECTAL | Status: DC | PRN

## 2018-07-24 MED ORDER — SODIUM CHLORIDE 0.9 % IV SOLN
1.0000 g | INTRAVENOUS | Status: DC
  Filled 2018-07-24: qty 10

## 2018-07-24 MED ORDER — SODIUM CHLORIDE 0.9 % IV SOLN
500.0000 mg | Freq: Once | INTRAVENOUS | Status: AC
  Administered 2018-07-24: 500 mg via INTRAVENOUS
  Filled 2018-07-24: qty 500

## 2018-07-24 MED ORDER — ENOXAPARIN SODIUM 40 MG/0.4ML ~~LOC~~ SOLN
40.0000 mg | SUBCUTANEOUS | Status: DC
  Administered 2018-07-24: 40 mg via SUBCUTANEOUS
  Filled 2018-07-24: qty 0.4

## 2018-07-24 MED ORDER — ONDANSETRON HCL 4 MG/2ML IJ SOLN: 4.0000 mg | Freq: Four times a day (QID) | INTRAMUSCULAR | Status: DC | PRN

## 2018-07-24 MED ORDER — SODIUM CHLORIDE 0.9 % IV BOLUS
1000.0000 mL | Freq: Once | INTRAVENOUS | Status: AC
  Administered 2018-07-25: 1000 mL via INTRAVENOUS

## 2018-07-24 MED ORDER — AZITHROMYCIN 250 MG PO TABS: 500.0000 mg | ORAL_TABLET | ORAL | Status: DC

## 2018-07-24 MED ORDER — GABAPENTIN 100 MG PO CAPS
100.0000 mg | ORAL_CAPSULE | Freq: Three times a day (TID) | ORAL | Status: DC
  Administered 2018-07-24 – 2018-07-27 (×9): 100 mg via ORAL
  Filled 2018-07-24 (×12): qty 1

## 2018-07-24 MED ORDER — METOPROLOL TARTRATE 25 MG PO TABS
25.0000 mg | ORAL_TABLET | Freq: Two times a day (BID) | ORAL | Status: DC
  Administered 2018-07-24 – 2018-07-27 (×6): 25 mg via ORAL
  Filled 2018-07-24 (×6): qty 1

## 2018-07-24 NOTE — H&P (Addendum)
[facility] [place] NAME: Edward Macias    MR#:  [id number]  DATE OF BIRTH:  [birth year]  DATE OF ADMISSION:  07/24/2018  PRIMARY CARE PHYSICIAN: Patient, No Pcp Per   REQUESTING/REFERRING PHYSICIan; Dr. Corky Downs  CHIEF COMPLAINT: Cough and fever   Chief Complaint  Patient presents with  . Cough  . Fever    HISTORY OF PRESENT ILLNESS:  Edward Macias  is a 58 y.o. 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  Noted to be hypoxic with 88% saturation on room air, improved to 97% on 2 L, x-ray is concerning for bilateral pulmonary infiltrates, multifocal pneumonia, because of his presentation with leukopenia, hypoxia, pulmonary infiltrates COVID 19 test has been sent out, patient placed in droplet precautions, because of hemoptysis he is also placed in airborne isolation.  PAST MEDICAL HISTORY:   Past Medical History:  Diagnosis Date  . Arthritis   . Asthma    AS A CHILD-NO INHALERS    PAST SURGICAL HISTOIRY:   Past Surgical History:  Procedure Laterality Date  . INGUINAL HERNIA REPAIR Left 10/03/2016   Procedure: HERNIA REPAIR INGUINAL ADULT;  Surgeon: Leonie Green, MD;  Location: [facility] ORS;  Service: General;  Laterality: Left;  . NO PAST SURGERIES      SOCIAL HISTORY:   Social History   Tobacco Use  . Smoking status: Current Every Day Smoker    Packs/day: 1.00    Years: 30.00    Pack years: 30.00    Types: Cigarettes  . Smokeless tobacco: Never Used  Substance Use Topics  . Alcohol use: No    FAMILY HISTORY:  No family history on file.  DRUG ALLERGIES:  No Known Allergies  REVIEW OF SYSTEMS:  CONSTITUTIONAL:  subjective fever EYES: No blurred or double vision.  EARS, NOSE, AND  THROAT: No tinnitus or ear pain.  RESPIRATORY: No cough, shortness of breath, wheezing or hemoptysis.  CARDIOVASCULAR: Cough, bilateral pleuritic chest pain, shortness of breath, hemoptysis. GASTROINTESTINAL: No nausea, vomiting, diarrhea or abdominal pain.  GENITOURINARY: No dysuria, hematuria.  ENDOCRINE: No polyuria, nocturia,  HEMATOLOGY: No anemia, easy bruising or bleeding SKIN: No rash or lesion. MUSCULOSKELETAL: No joint pain or arthritis.   NEUROLOGIC: No tingling, numbness, weakness.  PSYCHIATRY: No anxiety or depression.   MEDICATIONS AT HOME:   Prior to Admission medications   Medication Sig Start Date End Date Taking? Authorizing Provider  gabapentin (NEURONTIN) 100 MG capsule Take 100 mg by mouth 3 (three) times daily. 07/01/18  Yes [provider]  ibuprofen (ADVIL,MOTRIN) 200 MG tablet Take 600 mg by mouth 2 (two) times daily as needed for headache or mild pain.   Yes [provider]  loratadine (CLARITIN) 10 MG tablet Take 10 mg by mouth daily as needed for allergies or rhinitis.   Yes [provider]  HYDROcodone-acetaminophen (NORCO) 5-325 MG per tablet Take 1-2 tablets by mouth every 4 (four) hours as needed for moderate pain. Patient not taking: Reported on 09/24/2016 12/27/14   Beers, Pierce Crane, PA-C  HYDROcodone-acetaminophen (NORCO) 5-325 MG tablet Take 1-2 tablets by mouth every 4 (four) hours as needed for moderate pain. Patient not taking: Reported on 07/24/2018 10/03/16   Leonie Green, MD  ibuprofen (ADVIL,MOTRIN)  800 MG tablet Take 1 tablet (800 mg total) by mouth every 8 (eight) hours as needed. Patient not taking: Reported on 09/24/2016 12/27/14   Beers, Pierce Crane, PA-C  methocarbamol (ROBAXIN) 500 MG tablet Take 1 tablet (500 mg total) by mouth every 6 (six) hours as needed for muscle spasms. Patient not taking: Reported on 09/24/2016 12/27/14   Arlyss Repress, PA-C      VITAL SIGNS:  Blood pressure 111/86, pulse (!) 127,  temperature (!) 97.5 F (36.4 C), temperature source Oral, resp. rate (!) 26, height 5\' 9"  (1.753 m), weight 68 kg, SpO2 95 %.  PHYSICAL EXAMINATION:  GENERAL:  58 y.o.-year-old patient lying in the bed with no acute distress.  EYES: Pupils equal, round, reactive to light and accommodation. No scleral icterus. Extraocular muscles intact.  HEENT: Head atraumatic, normocephalic. Oropharynx and nasopharynx clear.  NECK:  Supple, no jugular venous distention. No thyroid enlargement, no tenderness.  LUNGS: Diminished breath sounds bilaterally. CARDIOVASCULAR: S1, S2 normal. No murmurs, rubs, or gallops.  ABDOMEN: Soft, nontender, nondistended. Bowel sounds present. No organomegaly or mass.  EXTREMITIES: No pedal edema, cyanosis, or clubbing.  NEUROLOGIC: Cranial nerves II through XII are intact. Muscle strength 5/5 in all extremities. Sensation intact. Gait not checked.  PSYCHIATRIC: The patient is alert and oriented x 3.  SKIN: No obvious rash, lesion, or ulcer.   LABORATORY PANEL:   CBC Recent Labs  Lab 07/24/18 0853  WBC 1.6*  HGB 17.2*  HCT 48.8  PLT 182   ------------------------------------------------------------------------------------------------------------------  Chemistries  Recent Labs  Lab 07/24/18 0853  NA 131*  K 3.7  CL 91*  CO2 25  GLUCOSE 102*  BUN 21*  CREATININE 1.03  CALCIUM 8.3*  AST 48*  ALT 30  ALKPHOS 28*  BILITOT 2.1*   ------------------------------------------------------------------------------------------------------------------  Cardiac Enzymes No results for input(s): TROPONINI in the last 168 hours. ------------------------------------------------------------------------------------------------------------------  RADIOLOGY:  Dg Chest 2 View  Result Date: 07/24/2018 CLINICAL DATA:  Worsening cough for 2 months. Fevers. Bloody sputum. EXAM: CHEST - 2 VIEW COMPARISON:  None. FINDINGS: Bilateral pulmonary infiltrates are identified in  the right infrahilar and left perihilar regions. No pneumothorax. The cardiomediastinal silhouette is normal. No nodules or masses identified. No other acute abnormalities. IMPRESSION: Bilateral pulmonary infiltrates. Multifocal pneumonia is a leading consideration given history of fevers. Recommend clinical correlation and short-term follow-up after treatment to ensure resolution. Electronically Signed   By: Dorise Bullion III M.D   On: 07/24/2018 09:14    EKG:   Orders placed or performed during the hospital encounter of 07/24/18  . ED EKG  . ED EKG  . EKG 12-Lead  . EKG 12-Lead  Normal sinus rhythm with no ST-T changes.  IMPRESSION AND PLAN:  58 year old male patient with neuropathy, heavy tobacco history smokes daily 1 pack of cigarettes a day for last 40 years, comes in because of fever, shortness of breath, cough, hemoptysis.  #1 .acute respiratory failure with hypoxia, bilateral pulmonary infiltrates, leukopenia, symptoms are concerning for Novel coronavirus, and placed in droplet precautions, airborne isolation due to history of hemoptysis also well.  Patient flu test has been negative,COVID 19 panel  has been sent out,  #2. sepsis present on admission elevated lactic acid, transient hypotension, tachycardia, secondary to multifocal pneumonia, continue IV antibiotics, supportive care with IV hydration, follow blood cultures, RSV panel.  Pneumonia pathway protocol followed.   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 All the records are reviewed and case discussed with ED provider. Management  plans discussed with the patient, family and they are in agreement.  CODE STATUS: Full code  TOTAL TIME TAKING CARE OF THIS PATIENT: 55 minutes.    Epifanio Lesches M.D on 07/24/2018 at 2:03 PM  Between 7am to 6pm - Pager - [phone]  After 6pm go to [email or web address] - password [email or web address] [facility]  Office  [phone]  CC: Primary care physician;  Patient, No Pcp Per  Note: This dictation was prepared with Dragon dictation along with smaller phrase technology. Any transcriptional errors that result from this process are unintentional.

## 2018-07-24 NOTE — ED Notes (Signed)
Date and time results received: 07/24/18 1023 (use smartphrase ".now" to insert current time)  Test: lactic acid Critical Value: 3.4  Name of Provider Notified: Dr. Corky Downs  Orders Received? Or Actions Taken?: no new orders at this time

## 2018-07-24 NOTE — ED Notes (Signed)
Patient masked on arrival prior to registration and triage.

## 2018-07-24 NOTE — Consult Note (Signed)
CODE SEPSIS - PHARMACY COMMUNICATION  **Broad Spectrum Antibiotics should be administered within 1 hour of Sepsis diagnosis**  Time Code Sepsis Called/Page Received: 1223  Antibiotics Ordered: azithromycin and Rocephin  Time of 1st antibiotic administration: 2897  Additional action taken by pharmacy: none required  If necessary, Name of Provider/Nurse Contacted: N/A    Dallie Piles ,PharmD Clinical Pharmacist  07/24/2018  12:51 PM

## 2018-07-24 NOTE — Progress Notes (Signed)
Pharmacy Antibiotic Note  Edward Macias is a 58 y.o. male admitted on 07/24/2018 with pneumonia.  Pharmacy has been consulted for vanc/zosyn dosing.  Plan: Patient received vanc 2g IV load  Vancomycin 750 mg IV Q 12 hrs. Goal AUC 400-550. Expected AUC: 458.9 SCr used: 1.03 mg/dL Will continue zosyn 3.375g IV q8h Will continue to monitor s/sx and monitor renal fx  Height: 5\' 9"  (175.3 cm) Weight: 207 lb 14.4 oz (94.3 kg) IBW/kg (Calculated) : 70.7  Temp (24hrs), Avg:98.2 F (36.8 C), Min:97.5 F (36.4 C), Max:99.1 F (37.3 C)  Recent Labs  Lab 07/24/18 0853 07/24/18 1128  WBC 1.6*  --   CREATININE 1.03  --   LATICACIDVEN 3.4* 4.1*    Estimated Creatinine Clearance: 88.6 mL/min (by C-G formula based on SCr of 1.03 mg/dL).    No Known Allergies  Thank you for allowing pharmacy to be a part of this patient's care.  Tobie Lords, PharmD, BCPS Clinical Pharmacist 07/24/2018

## 2018-07-24 NOTE — ED Notes (Signed)
Date and time results received: 07/24/18 12:15 PM (use smartphrase ".now" to insert current time)  Test: lactic acid (repeat) Critical Value: 4.1  Name of Provider Notified: Dr. Corky Downs  Orders Received? Or Actions Taken?: Actions Taken: no new orders at this time

## 2018-07-24 NOTE — ED Notes (Signed)
ED TO INPATIENT HANDOFF REPORT  ED Nurse Name and Phone #:  Elmo Putt #[phone]  S Name/Age/Gender Edward Macias 58 y.o. male Room/Bed: ED06A/ED06A  Code Status   Code Status: Full Code  Home/SNF/Other Home Patient oriented to: self, place, time and situation Is this baseline? Yes   Triage Complete: Triage complete  Chief Complaint diff breathing ribs pain  Triage Note States cough x 2 months, states fevers x 1 month. States bloody sputum began yesterday.    Allergies No Known Allergies  Level of Care/Admitting Diagnosis ED Disposition    ED Disposition Condition [facility] Area: [place] [[id number]]  Level of Care: Telemetry [5]  Diagnosis: Sepsis [facility]) [[id number]]  Admitting Physician: Epifanio Lesches [[id number]]  Attending Physician: Epifanio Lesches [phone]  Estimated length of stay: past midnight tomorrow  Certification:: I certify this patient will need inpatient services for at least 2 midnights  PT Class (Do Not Modify): Inpatient [101]  PT Acc Code (Do Not Modify): Private [1]       B Medical/Surgery History Past Medical History:  Diagnosis Date  . Arthritis   . Asthma    AS A CHILD-NO INHALERS   Past Surgical History:  Procedure Laterality Date  . INGUINAL HERNIA REPAIR Left 10/03/2016   Procedure: HERNIA REPAIR INGUINAL ADULT;  Surgeon: Leonie Green, MD;  Location: [facility] ORS;  Service: General;  Laterality: Left;  . NO PAST SURGERIES       A IV Location/Drains/Wounds Patient Lines/Drains/Airways Status   Active Line/Drains/Airways    Name:   Placement date:   Placement time:   Site:   Days:   Peripheral IV 07/24/18 Left Forearm   07/24/18    0938    Forearm   less than 1   Peripheral IV 07/24/18 Left Antecubital   07/24/18    0939    Antecubital   less than 1   Incision (Closed) 10/03/16 Abdomen Left   10/03/16    1145     659          Intake/Output Last 24 hours  Intake/Output Summary (Last  24 hours) at 07/24/2018 1356 Last data filed at 07/24/2018 1325 Gross per 24 hour  Intake 1600 ml  Output -  Net 1600 ml    Labs/Imaging Results for orders placed or performed during the hospital encounter of 07/24/18 (from the past 48 hour(s))  CBC with Differential     Status: Abnormal   Collection Time: 07/24/18  8:53 AM  Result Value Ref Range   WBC 1.6 (L) 4.0 - 10.5 K/uL   RBC 5.16 4.22 - 5.81 MIL/uL   Hemoglobin 17.2 (H) 13.0 - 17.0 g/dL   HCT 48.8 39.0 - 52.0 %   MCV 94.6 80.0 - 100.0 fL   MCH 33.3 26.0 - 34.0 pg   MCHC 35.2 30.0 - 36.0 g/dL   RDW 13.9 11.5 - 15.5 %   Platelets 182 150 - 400 K/uL   nRBC 0.0 0.0 - 0.2 %   Neutrophils Relative % 61 %   Neutro Abs 1.0 (L) 1.7 - 7.7 K/uL   Lymphocytes Relative 30 %   Lymphs Abs 0.5 (L) 0.7 - 4.0 K/uL   Monocytes Relative 7 %   Monocytes Absolute 0.1 0.1 - 1.0 K/uL   Eosinophils Relative 0 %   Eosinophils Absolute 0.0 0.0 - 0.5 K/uL   Basophils Relative 1 %   Basophils Absolute 0.0 0.0 - 0.1 K/uL   WBC Morphology VACUOLATED  NEUTROPHILS    RBC Morphology MORPHOLOGY UNREMARKABLE    Smear Review Normal platelet morphology    Immature Granulocytes 1 %   Abs Immature Granulocytes 0.01 0.00 - 0.07 K/uL    Comment: Performed at [facility], [address] [place], [place] [address]  Comprehensive metabolic panel     Status: Abnormal   Collection Time: 07/24/18  8:53 AM  Result Value Ref Range   Sodium 131 (L) 135 - 145 mmol/L   Potassium 3.7 3.5 - 5.1 mmol/L   Chloride 91 (L) 98 - 111 mmol/L   CO2 25 22 - 32 mmol/L   Glucose, Bld 102 (H) 70 - 99 mg/dL   BUN 21 (H) 6 - 20 mg/dL   Creatinine, Ser 1.03 0.61 - 1.24 mg/dL   Calcium 8.3 (L) 8.9 - 10.3 mg/dL   Total Protein 8.1 6.5 - 8.1 g/dL   Albumin 3.7 3.5 - 5.0 g/dL   AST 48 (H) 15 - 41 U/L   ALT 30 0 - 44 U/L   Alkaline Phosphatase 28 (L) 38 - 126 U/L   Total Bilirubin 2.1 (H) 0.3 - 1.2 mg/dL   GFR calc non Af Amer >60 >60 mL/min   GFR calc Af Amer >60  >60 mL/min   Anion gap 15 5 - 15    Comment: Performed at [facility], [address] [place], [place] [address]  Lactic acid, plasma     Status: Abnormal   Collection Time: 07/24/18  8:53 AM  Result Value Ref Range   Lactic Acid, Venous 3.4 (HH) 0.5 - 1.9 mmol/L    Comment: CRITICAL RESULT CALLED TO, READ BACK BY AND VERIFIED WITH Angelik Walls GRAINGER AT 2725 ON 07/24/2018 [place]. Performed at [facility], [address] [place], [place] [address]   Influenza panel by PCR (type A & B)     Status: None   Collection Time: 07/24/18  8:54 AM  Result Value Ref Range   Influenza A By PCR NEGATIVE NEGATIVE   Influenza B By PCR NEGATIVE NEGATIVE    Comment: (NOTE) The Xpert Xpress Flu assay is intended as an aid in the diagnosis of  influenza and should not be used as a sole basis for treatment.  This  assay is FDA approved for nasopharyngeal swab specimens only. Nasal  washings and aspirates are unacceptable for Xpert Xpress Flu testing. Performed at [facility], [address] [place], [place] [address]   Lactic acid, plasma     Status: Abnormal   Collection Time: 07/24/18 11:28 AM  Result Value Ref Range   Lactic Acid, Venous 4.1 (HH) 0.5 - 1.9 mmol/L    Comment: CRITICAL RESULT CALLED TO, READ BACK BY AND VERIFIED WITH Mariza Bourget 2/59/56 @ 16  [facility] Performed at [facility], [address], [place], [place] [address]    Dg Chest 2 View  Result Date: 07/24/2018 CLINICAL DATA:  Worsening cough for 2 months. Fevers. Bloody sputum. EXAM: CHEST - 2 VIEW COMPARISON:  None. FINDINGS: Bilateral pulmonary infiltrates are identified in the right infrahilar and left perihilar regions. No pneumothorax. The cardiomediastinal silhouette is normal. No nodules or masses identified. No other acute abnormalities. IMPRESSION: Bilateral pulmonary infiltrates. Multifocal pneumonia is a leading consideration given history of fevers. Recommend clinical correlation  and short-term follow-up after treatment to ensure resolution. Electronically Signed   By: Dorise Bullion III M.D   On: 07/24/2018 09:14    Pending Labs Unresulted Labs (From admission, onward)  Start     Ordered   07/31/18 0500  Creatinine, serum  (enoxaparin (LOVENOX)    CrCl >/= 30 ml/min)  Weekly,   STAT    Comments:  while on enoxaparin therapy    07/24/18 1101   07/25/18 4656  Basic metabolic panel  Tomorrow morning,   STAT     07/24/18 1101   07/25/18 0500  CBC  Tomorrow morning,   STAT     07/24/18 1101   07/24/18 1100  Legionella Pneumophila Serogp 1 Ur Ag  Once,   STAT     07/24/18 1101   07/24/18 1057  HIV antibody (Routine Screening)  Once,   STAT     07/24/18 1101   07/24/18 1057  Gram stain  Once,   STAT     07/24/18 1101   07/24/18 1057  Strep pneumoniae urinary antigen  Once,   STAT     07/24/18 1101   07/24/18 1055  Respiratory Panel by PCR  (Respiratory virus panel with precautions)  Once,   STAT     07/24/18 1054   07/24/18 1043  Novel Coronavirus, NAA (hospital order; send-out to ref lab)  (Novel Coronavirus, NAA [facility] Order; send-out to ref lab) with precautions panel)  Once,   STAT    Question:  Patient immune status  Answer:  Normal   07/24/18 1043   07/24/18 0844  Blood culture (routine x 2)  BLOOD CULTURE X 2,   STAT     07/24/18 0844          Vitals/Pain Today's Vitals   07/24/18 1300 07/24/18 1330 07/24/18 1331 07/24/18 1340  BP: 110/86  111/86   Pulse:  (!) 128 (!) 127   Resp:  (!) 34 (!) 26   Temp:      TempSrc:      SpO2:  94% 95%   Weight:      Height:      PainSc:    5     Isolation Precautions Airborne precautions  Medications Medications  loratadine (CLARITIN) tablet 10 mg (has no administration in time range)  gabapentin (NEURONTIN) capsule 100 mg (has no administration in time range)  0.9 %  sodium chloride infusion (has no administration in time range)  acetaminophen (TYLENOL) tablet 650 mg (650 mg Oral Given 07/24/18  1342)    Or  acetaminophen (TYLENOL) suppository 650 mg ( Rectal See Alternative 07/24/18 1342)  docusate sodium (COLACE) capsule 100 mg (has no administration in time range)  bisacodyl (DULCOLAX) EC tablet 5 mg (has no administration in time range)  ondansetron (ZOFRAN) tablet 4 mg (has no administration in time range)    Or  ondansetron (ZOFRAN) injection 4 mg (has no administration in time range)  enoxaparin (LOVENOX) injection 40 mg (has no administration in time range)  cefTRIAXone (ROCEPHIN) 1 g in sodium chloride 0.9 % 100 mL IVPB (has no administration in time range)  azithromycin (ZITHROMAX) tablet 500 mg (has no administration in time range)  0.9 %  sodium chloride infusion (0 mLs Intravenous Stopped 07/24/18 1048)  cefTRIAXone (ROCEPHIN) 1 g in sodium chloride 0.9 % 100 mL IVPB (0 g Intravenous Stopped 07/24/18 1017)  azithromycin (ZITHROMAX) 500 mg in sodium chloride 0.9 % 250 mL IVPB (0 mg Intravenous Stopped 07/24/18 1200)  sodium chloride 0.9 % bolus 1,000 mL (1,000 mLs Intravenous Transfusing/Transfer 07/24/18 1345)    And  sodium chloride 0.9 % bolus 1,000 mL (1,000 mLs Intravenous Transfusing/Transfer 07/24/18 1345)    And  sodium chloride 0.9 % bolus 250 mL (0 mLs Intravenous Stopped 07/24/18 1325)    Mobility walks Low fall risk   Focused Assessments Cardiac Assessment Handoff:  Cardiac Rhythm: Sinus tachycardia No results found for: CKTOTAL, CKMB, CKMBINDEX, TROPONINI No results found for: DDIMER Does the Patient currently have chest pain? No   , Pulmonary Assessment Handoff:  Lung sounds: L Breath Sounds: Diminished R Breath Sounds: Diminished O2 Device: Nasal Cannula O2 Flow Rate (L/min): 3 L/min      R Recommendations: See Admitting Provider Note  Report given to: (2A RN) Alana  Additional Notes:

## 2018-07-24 NOTE — ED Triage Notes (Signed)
States cough x 2 months, states fevers x 1 month. States bloody sputum began yesterday.

## 2018-07-24 NOTE — ED Provider Notes (Addendum)
[facility] Emergency Department Provider Note   ____________________________________________    I have reviewed the triage vital signs and the nursing notes.   HISTORY  Chief Complaint Cough and Fever     HPI Edward Macias is a 58 y.o. male who presents with complaints of cough and fever and shortness of breath.  Patient reports cough x2 months with worsening shortness of breath over the last several days and also some hemoptysis.  He reports fevers at home.  He does not take anything for this.  He does smoke cigarettes.  Denies a history of COPD.  No nausea or vomiting no chest pain.  No pleurisy.  No recent travel.  No calf pain or swelling  Past Medical History:  Diagnosis Date  . Arthritis   . Asthma    AS A CHILD-NO INHALERS    Patient Active Problem List   Diagnosis Date Noted  . Sepsis ([place]) 07/24/2018    Past Surgical History:  Procedure Laterality Date  . INGUINAL HERNIA REPAIR Left 10/03/2016   Procedure: HERNIA REPAIR INGUINAL ADULT;  Surgeon: Leonie Green, MD;  Location: [facility] ORS;  Service: General;  Laterality: Left;  . NO PAST SURGERIES      Prior to Admission medications   Medication Sig Start Date End Date Taking? Authorizing Provider  gabapentin (NEURONTIN) 100 MG capsule Take 100 mg by mouth 3 (three) times daily. 07/01/18  Yes [provider]  ibuprofen (ADVIL,MOTRIN) 200 MG tablet Take 600 mg by mouth 2 (two) times daily as needed for headache or mild pain.   Yes [provider]  loratadine (CLARITIN) 10 MG tablet Take 10 mg by mouth daily as needed for allergies or rhinitis.   Yes [provider]  HYDROcodone-acetaminophen (NORCO) 5-325 MG per tablet Take 1-2 tablets by mouth every 4 (four) hours as needed for moderate pain. Patient not taking: Reported on 09/24/2016 12/27/14   Beers, Pierce Crane, PA-C  HYDROcodone-acetaminophen (NORCO) 5-325 MG tablet Take 1-2 tablets by mouth every 4 (four)  hours as needed for moderate pain. Patient not taking: Reported on 07/24/2018 10/03/16   Leonie Green, MD  ibuprofen (ADVIL,MOTRIN) 800 MG tablet Take 1 tablet (800 mg total) by mouth every 8 (eight) hours as needed. Patient not taking: Reported on 09/24/2016 12/27/14   Beers, Pierce Crane, PA-C  methocarbamol (ROBAXIN) 500 MG tablet Take 1 tablet (500 mg total) by mouth every 6 (six) hours as needed for muscle spasms. Patient not taking: Reported on 09/24/2016 12/27/14   Arlyss Repress, PA-C     Allergies Patient has no known allergies.  No family history on file.  Social History Social History   Tobacco Use  . Smoking status: Current Every Day Smoker    Packs/day: 1.00    Years: 30.00    Pack years: 30.00    Types: Cigarettes  . Smokeless tobacco: Never Used  Substance Use Topics  . Alcohol use: No  . Drug use: No    Review of Systems  Constitutional: As above Eyes: No visual changes.  ENT: No sore throat. Cardiovascular: Denies chest pain. Respiratory: As above Gastrointestinal: No abdominal pain.  No nausea, no vomiting.   Genitourinary: Negative for dysuria. Musculoskeletal: Negative for back pain. Skin: Negative for rash. Neurological: Negative for headaches or weakness   ____________________________________________   PHYSICAL EXAM:  VITAL SIGNS: ED Triage Vitals  Enc Vitals Group     BP 07/24/18 0838 103/73     Pulse Rate  07/24/18 0838 (!) 133     Resp 07/24/18 0838 20     Temp 07/24/18 0838 (!) 97.5 F (36.4 C)     Temp Source 07/24/18 0838 Oral     SpO2 07/24/18 0838 (!) 88 %     Weight 07/24/18 0839 68 kg (150 lb)     Height 07/24/18 0839 1.753 m (5\' 9" )     Head Circumference --      Peak Flow --      Pain Score 07/24/18 0839 2     Pain Loc --      Pain Edu? --      Excl. in [place]? --     Constitutional: Alert and oriented. Eyes: Conjunctivae are normal.   Nose: No congestion/rhinnorhea. Mouth/Throat: Mucous membranes are moist.     Cardiovascular: Tachycardia, regular rhythm. Grossly normal heart sounds.  Good peripheral circulation. Respiratory: Increased respiratory effort with tachypnea and hypoxia, bibasilar Rales, no significant wheezing Gastrointestinal: Soft and nontender. No distention.    Musculoskeletal: No lower extremity tenderness nor edema.  Warm and well perfused Neurologic:  Normal speech and language. No gross focal neurologic deficits are appreciated.  Skin:  Skin is warm, dry and intact. No rash noted. Psychiatric: Mood and affect are normal. Speech and behavior are normal.  ____________________________________________   LABS (all labs ordered are listed, but only abnormal results are displayed)  Labs Reviewed  CBC WITH DIFFERENTIAL/PLATELET - Abnormal; Notable for the following components:      Result Value   WBC 1.6 (*)    Hemoglobin 17.2 (*)    All other components within normal limits  COMPREHENSIVE METABOLIC PANEL - Abnormal; Notable for the following components:   Sodium 131 (*)    Chloride 91 (*)    Glucose, Bld 102 (*)    BUN 21 (*)    Calcium 8.3 (*)    AST 48 (*)    Alkaline Phosphatase 28 (*)    Total Bilirubin 2.1 (*)    All other components within normal limits  LACTIC ACID, PLASMA - Abnormal; Notable for the following components:   Lactic Acid, Venous 3.4 (*)    All other components within normal limits  CULTURE, BLOOD (ROUTINE X 2)  CULTURE, BLOOD (ROUTINE X 2)  NOVEL CORONAVIRUS, NAA (HOSPITAL ORDER, SEND-OUT TO REF LAB)  RESPIRATORY PANEL BY PCR  CULTURE, BLOOD (ROUTINE X 2)  CULTURE, BLOOD (ROUTINE X 2)  GRAM STAIN  INFLUENZA PANEL BY PCR (TYPE A & B)  LACTIC ACID, PLASMA  HIV ANTIBODY (ROUTINE TESTING W REFLEX)  STREP PNEUMONIAE URINARY ANTIGEN  LEGIONELLA PNEUMOPHILA SEROGP 1 UR AG  CBC  CREATININE, SERUM   ____________________________________________  EKG  ED ECG REPORT I, Lavonia Drafts, the attending physician, personally viewed and interpreted this  ECG.  Date: 07/24/2018  Rate: 126 Rhythm: normal sinus rhythm QRS Axis: normal Intervals: normal ST/T Wave abnormalities: normal Narrative Interpretation: no evidence of acute ischemia  ____________________________________________  RADIOLOGY  cxr with multifocal pna ____________________________________________   PROCEDURES  Procedure(s) performed: No  Procedures   Critical Care performed: yes  CRITICAL CARE Performed by: Lavonia Drafts   Total critical care time: 30 minutes  Critical care time was exclusive of separately billable procedures and treating other patients.  Critical care was necessary to treat or prevent imminent or life-threatening deterioration.  Critical care was time spent personally by me on the following activities: development of treatment plan with patient and/or surrogate as well as nursing, discussions with consultants, evaluation of  patient's response to treatment, examination of patient, obtaining history from patient or surrogate, ordering and performing treatments and interventions, ordering and review of laboratory studies, ordering and review of radiographic studies, pulse oximetry and re-evaluation of patient's condition. ____________________________________________   INITIAL IMPRESSION / ASSESSMENT AND PLAN / ED COURSE  Pertinent labs & imaging results that were available during my care of the patient were reviewed by me and considered in my medical decision making (see chart for details).  Patient presents with tachycardia, hypoxia, tachypnea.  Reports hemoptysis.  Differential includes pneumonia, viral respiratory infection including possible novel coronavirus although unlikely, less likely PE as no pleurisy or chest pain.  Pending labs, chest x-ray, most suspicious for pneumonia given reports of fever with tachycardia hypoxia and hemoptysis   Chest x-ray is concerning for multifocal pneumonia, IV Rocephin and azithromycin started   Patient also has leukopenia and an elevated lactic acid.  Unfortunately leukopenia has been 1 of the frequently seen findings with novel coronavirus, we will send respiratory panel PCR, novel coronavirus PCR and admit to the hospital service.  Attempted to call infection prevention but only obtained answering service    ____________________________________________   FINAL CLINICAL IMPRESSION(S) / ED DIAGNOSES  Final diagnoses:  Community acquired pneumonia, unspecified laterality        Note:  This document was prepared using Systems analyst and may include unintentional dictation errors.   Lavonia Drafts, MD 07/24/18 1103    Lavonia Drafts, MD 07/24/18 1133

## 2018-07-24 NOTE — Progress Notes (Signed)
Notify Dr. Vianne Bulls about patient's complaints of SOB especially with activity, SpO2 is 98% in 3L, order given for Xoponex q6. Also notify about patient's HR sustaining to 120's order given for Metoprolol 25 mg bid. RN will continue to monitor.

## 2018-07-24 NOTE — Progress Notes (Signed)
Patient was seen earlier for svn tx. Patient apparently on precautions for COVID-19. Proper precautions handled during pre and post arrangement of aerosol therapy. RN, Roseanne Reno, prepared for entrance and exit. Once patient heart rate decreases will call for change to respiratory medication to mdi vs aerosol due to risk factors. Patient tolerated tx well.

## 2018-07-25 ENCOUNTER — Encounter: Payer: Self-pay | Admitting: Radiology

## 2018-07-25 ENCOUNTER — Inpatient Hospital Stay: Payer: PRIVATE HEALTH INSURANCE

## 2018-07-25 DIAGNOSIS — D72819 Decreased white blood cell count, unspecified: Secondary | ICD-10-CM

## 2018-07-25 DIAGNOSIS — F17210 Nicotine dependence, cigarettes, uncomplicated: Secondary | ICD-10-CM

## 2018-07-25 DIAGNOSIS — J189 Pneumonia, unspecified organism: Secondary | ICD-10-CM

## 2018-07-25 LAB — RESPIRATORY PANEL BY PCR
Adenovirus: NOT DETECTED
BORDETELLA PERTUSSIS-RVPCR: NOT DETECTED
CHLAMYDOPHILA PNEUMONIAE-RVPPCR: NOT DETECTED
Coronavirus 229E: NOT DETECTED
Coronavirus HKU1: NOT DETECTED
Coronavirus NL63: NOT DETECTED
Coronavirus OC43: NOT DETECTED
INFLUENZA A-RVPPCR: NOT DETECTED
Influenza B: NOT DETECTED
MYCOPLASMA PNEUMONIAE-RVPPCR: NOT DETECTED
Metapneumovirus: NOT DETECTED
PARAINFLUENZA VIRUS 3-RVPPCR: NOT DETECTED
PARAINFLUENZA VIRUS 4-RVPPCR: NOT DETECTED
Parainfluenza Virus 1: NOT DETECTED
Parainfluenza Virus 2: NOT DETECTED
RHINOVIRUS / ENTEROVIRUS - RVPPCR: NOT DETECTED
Respiratory Syncytial Virus: NOT DETECTED

## 2018-07-25 LAB — CBC WITH DIFFERENTIAL/PLATELET
Abs Immature Granulocytes: 0.02 10*3/uL (ref 0.00–0.07)
BASOS PCT: 0 %
Basophils Absolute: 0 10*3/uL (ref 0.0–0.1)
Eosinophils Absolute: 0 10*3/uL (ref 0.0–0.5)
Eosinophils Relative: 0 %
HEMATOCRIT: 50.5 % (ref 39.0–52.0)
Hemoglobin: 16.9 g/dL (ref 13.0–17.0)
Immature Granulocytes: 1 %
Lymphocytes Relative: 11 %
Lymphs Abs: 0.4 10*3/uL — ABNORMAL LOW (ref 0.7–4.0)
MCH: 32.9 pg (ref 26.0–34.0)
MCHC: 33.5 g/dL (ref 30.0–36.0)
MCV: 98.4 fL (ref 80.0–100.0)
Monocytes Absolute: 0.1 10*3/uL (ref 0.1–1.0)
Monocytes Relative: 2 %
Neutro Abs: 3.5 10*3/uL (ref 1.7–7.7)
Neutrophils Relative %: 86 %
PLATELETS: 163 10*3/uL (ref 150–400)
RBC: 5.13 MIL/uL (ref 4.22–5.81)
RDW: 14.1 % (ref 11.5–15.5)
Smear Review: NORMAL
WBC: 4.1 10*3/uL (ref 4.0–10.5)
nRBC: 0 % (ref 0.0–0.2)

## 2018-07-25 LAB — COMPREHENSIVE METABOLIC PANEL
ALT: 23 U/L (ref 0–44)
ANION GAP: 14 (ref 5–15)
AST: 45 U/L — ABNORMAL HIGH (ref 15–41)
Albumin: 3 g/dL — ABNORMAL LOW (ref 3.5–5.0)
Alkaline Phosphatase: 18 U/L — ABNORMAL LOW (ref 38–126)
BUN: 21 mg/dL — ABNORMAL HIGH (ref 6–20)
CHLORIDE: 99 mmol/L (ref 98–111)
CO2: 20 mmol/L — ABNORMAL LOW (ref 22–32)
Calcium: 6.8 mg/dL — ABNORMAL LOW (ref 8.9–10.3)
Creatinine, Ser: 0.97 mg/dL (ref 0.61–1.24)
GFR calc Af Amer: >60 mL/min (ref >60)
GFR calc non Af Amer: >60 mL/min (ref >60)
Glucose, Bld: 68 mg/dL — ABNORMAL LOW (ref 70–99)
Potassium: 4.2 mmol/L (ref 3.5–5.1)
Sodium: 133 mmol/L — ABNORMAL LOW (ref 135–145)
Total Bilirubin: 1.5 mg/dL — ABNORMAL HIGH (ref 0.3–1.2)
Total Protein: 6.8 g/dL (ref 6.5–8.1)

## 2018-07-25 LAB — LACTATE DEHYDROGENASE: LDH: 140 U/L (ref 98–192)

## 2018-07-25 LAB — STREP PNEUMONIAE URINARY ANTIGEN: Strep Pneumo Urinary Antigen: POSITIVE — AB

## 2018-07-25 LAB — MRSA PCR SCREENING: MRSA by PCR: NEGATIVE

## 2018-07-25 LAB — PHOSPHORUS: Phosphorus: 3.5 mg/dL (ref 2.5–4.6)

## 2018-07-25 LAB — EXPECTORATED SPUTUM ASSESSMENT W GRAM STAIN, RFLX TO RESP C

## 2018-07-25 LAB — LACTIC ACID, PLASMA: Lactic Acid, Venous: 3.7 mmol/L (ref 0.5–1.9)

## 2018-07-25 LAB — MAGNESIUM
Magnesium: 0.9 mg/dL — CL (ref 1.7–2.4)
Magnesium: 2 mg/dL (ref 1.7–2.4)

## 2018-07-25 LAB — PROCALCITONIN: Procalcitonin: 77.66 ng/mL

## 2018-07-25 MED ORDER — MAGNESIUM SULFATE 4 GM/100ML IV SOLN
4.0000 g | Freq: Once | INTRAVENOUS | Status: AC
  Administered 2018-07-25: 4 g via INTRAVENOUS
  Filled 2018-07-25: qty 100

## 2018-07-25 MED ORDER — SODIUM CHLORIDE 0.9 % IV SOLN
INTRAVENOUS | Status: DC | PRN
  Administered 2018-07-25 (×2): 500 mL via INTRAVENOUS

## 2018-07-25 MED ORDER — ALBUTEROL SULFATE HFA 108 (90 BASE) MCG/ACT IN AERS
2.0000 | INHALATION_SPRAY | RESPIRATORY_TRACT | Status: DC | PRN
  Filled 2018-07-25: qty 6.7

## 2018-07-25 MED ORDER — ENOXAPARIN SODIUM 40 MG/0.4ML ~~LOC~~ SOLN
40.0000 mg | SUBCUTANEOUS | Status: DC
  Administered 2018-07-25 – 2018-07-26 (×2): 40 mg via SUBCUTANEOUS
  Filled 2018-07-25 (×2): qty 0.4

## 2018-07-25 MED ORDER — IOHEXOL 300 MG/ML  SOLN
75.0000 mL | Freq: Once | INTRAMUSCULAR | Status: AC | PRN
  Administered 2018-07-25: 75 mL via INTRAVENOUS

## 2018-07-25 MED ORDER — MOMETASONE FURO-FORMOTEROL FUM 100-5 MCG/ACT IN AERO
2.0000 | INHALATION_SPRAY | Freq: Two times a day (BID) | RESPIRATORY_TRACT | Status: DC
  Administered 2018-07-25 – 2018-07-27 (×5): 2 via RESPIRATORY_TRACT
  Filled 2018-07-25: qty 8.8

## 2018-07-25 MED ORDER — ENSURE ENLIVE PO LIQD
237.0000 mL | Freq: Two times a day (BID) | ORAL | Status: DC
  Administered 2018-07-25: 237 mL via ORAL

## 2018-07-25 MED ORDER — METHYLPREDNISOLONE SODIUM SUCC 40 MG IJ SOLR
40.0000 mg | Freq: Two times a day (BID) | INTRAMUSCULAR | Status: DC
  Administered 2018-07-25 – 2018-07-27 (×5): 40 mg via INTRAVENOUS
  Filled 2018-07-25 (×5): qty 1

## 2018-07-25 MED ORDER — NICOTINE 21 MG/24HR TD PT24
21.0000 mg | MEDICATED_PATCH | Freq: Every day | TRANSDERMAL | Status: DC
  Administered 2018-07-25 – 2018-07-26 (×2): 21 mg via TRANSDERMAL
  Filled 2018-07-25 (×3): qty 1

## 2018-07-25 MED ORDER — IPRATROPIUM-ALBUTEROL 20-100 MCG/ACT IN AERS
1.0000 | INHALATION_SPRAY | RESPIRATORY_TRACT | Status: DC
  Administered 2018-07-25 – 2018-07-27 (×12): 1 via RESPIRATORY_TRACT
  Filled 2018-07-25: qty 4

## 2018-07-25 MED ORDER — SODIUM CHLORIDE 0.9 % IV SOLN
500.0000 mg | INTRAVENOUS | Status: DC
  Administered 2018-07-25 – 2018-07-26 (×2): 500 mg via INTRAVENOUS
  Filled 2018-07-25 (×3): qty 500

## 2018-07-25 MED ORDER — SODIUM CHLORIDE 0.9 % IV SOLN
2.0000 g | INTRAVENOUS | Status: DC
  Administered 2018-07-25 – 2018-07-26 (×2): 2 g via INTRAVENOUS
  Filled 2018-07-25: qty 20
  Filled 2018-07-25 (×2): qty 2

## 2018-07-25 MED ORDER — MAGNESIUM SULFATE 50 % IJ SOLN: 3.0000 g | Freq: Once | INTRAVENOUS | Status: DC

## 2018-07-25 MED ORDER — IPRATROPIUM-ALBUTEROL 0.5-2.5 (3) MG/3ML IN SOLN: 3.0000 mL | RESPIRATORY_TRACT | Status: DC

## 2018-07-25 NOTE — Progress Notes (Signed)
Order received to change to prn albuterol inhaler.

## 2018-07-25 NOTE — Progress Notes (Signed)
Called the on call MD about the declining issue of the patient. MD put in new lab orders and CT of the Chest.

## 2018-07-25 NOTE — Consult Note (Signed)
NAME: Edward Macias  DOB: 06-Jul-1960  MRN: [id number]  Date/Time: 07/25/2018 5:23 PM  REQUESTING PROVIDER: MODY Subjective:  REASON FOR CONSULT: CAP/TB/COVID 19 ? Edward Macias is a 58 y.o.male   a smoker presents with 2 day of generalized weakness and sob and blood in sputum and 2 months of cough which he thought was his allergies , and 1-2 months of subjective fever which he thought was hay fever. 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 He laid in bed the whole of Friday and on Saturday he thought he could go to work but was worse with increasing sob and came to the ED.  In the ED temp of 97.5, BP 103/73, HR 133 sao2 88 CXR showed b/l infiltrate and CT showed multifocal pneumonia with GGO Was started on vanco and zosyn He is now in airborne isolation COVID testing was sent Pt lives in [place] with his girl friend- no travel History, no contacts with anyone sick . Smoker- 1PPD Drinks 1/2 bottle of gin a day Denies any aspiration No antibiotic or steroid use   Past Medical History:  Diagnosis Date  . Arthritis   . Asthma    AS A CHILD-NO INHALERS    Past Surgical History:  Procedure Laterality Date  . INGUINAL HERNIA REPAIR Left 10/03/2016   Procedure: HERNIA REPAIR INGUINAL ADULT;  Surgeon: Leonie Green, MD;  Location: [facility] ORS;  Service: General;  Laterality: Left;  . NO PAST SURGERIES      SH Smoker 1 PPD Alcohol 1/2 bottle of GIN/day    History reviewed. No pertinent family history. No Known Allergies ? Current Facility-Administered Medications  Medication Dose Route Frequency Provider Last Rate Last Dose  . 0.9 %  sodium chloride infusion   Intravenous Continuous Epifanio Lesches, MD 100 mL/hr at 07/25/18 0518    . 0.9 %  sodium chloride infusion   Intravenous PRN Arta Silence, MD 10 mL/hr at 07/25/18 0015 500 mL at 07/25/18 0015   . acetaminophen (TYLENOL) tablet 650 mg  650 mg Oral Q6H PRN Epifanio Lesches, MD   650 mg at 07/25/18 6629   Or  . acetaminophen (TYLENOL) suppository 650 mg  650 mg Rectal Q6H PRN Epifanio Lesches, MD      . albuterol (PROVENTIL HFA;VENTOLIN HFA) 108 (90 Base) MCG/ACT inhaler 2 puff  2 puff Inhalation Q4H PRN Arta Silence, MD      . bisacodyl (DULCOLAX) EC tablet 5 mg  5 mg Oral Daily PRN Epifanio Lesches, MD      . docusate sodium (COLACE) capsule 100 mg  100 mg Oral BID Epifanio Lesches, MD      . enoxaparin (LOVENOX) injection 40 mg  40 mg Subcutaneous Q24H Epifanio Lesches, MD   40 mg at 07/25/18 1607  . feeding supplement (ENSURE ENLIVE) (ENSURE ENLIVE) liquid 237 mL  237 mL Oral BID BM Mody, Sital, MD   237 mL at 07/25/18 1327  . gabapentin (NEURONTIN) capsule 100 mg  100 mg Oral TID Epifanio Lesches, MD   100 mg at 07/25/18 1607  . Ipratropium-Albuterol (COMBIVENT) respimat 1 puff  1 puff Inhalation Q4H Bettey Costa, MD   1 puff at 07/25/18 1327  . loratadine (CLARITIN) tablet 10 mg  10 mg Oral Daily PRN Epifanio Lesches, MD      . methylPREDNISolone sodium succinate (SOLU-MEDROL) 40 mg/mL injection 40 mg  40 mg Intravenous Q12H Bettey Costa, MD   40 mg at 07/25/18 1119  . metoprolol tartrate (LOPRESSOR) tablet 25 mg  25 mg Oral BID Epifanio Lesches, MD   25 mg at 07/25/18 0915  . mometasone-formoterol (DULERA) 100-5 MCG/ACT inhaler 2 puff  2 puff Inhalation BID Bettey Costa, MD   2 puff at 07/25/18 1119  . nicotine (NICODERM CQ - dosed in mg/24 hours) patch 21 mg  21 mg Transdermal Daily Bettey Costa, MD   21 mg at 07/25/18 1118  . ondansetron (ZOFRAN) tablet 4 mg  4 mg Oral Q6H PRN Epifanio Lesches, MD       Or  . ondansetron (ZOFRAN) injection 4 mg  4 mg Intravenous Q6H PRN Epifanio Lesches, MD      . piperacillin-tazobactam (ZOSYN) IVPB 3.375 g  3.375 g Intravenous Q8H Epifanio Lesches, MD 12.5 mL/hr at 07/25/18 1331 3.375 g at 07/25/18  1331  . vancomycin (VANCOCIN) IVPB 750 mg/150 ml premix  750 mg Intravenous Q12H Epifanio Lesches, MD 150 mL/hr at 07/25/18 1120 750 mg at 07/25/18 1120     Abtx:  Anti-infectives (From admission, onward)   Start     Dose/Rate Route Frequency Ordered Stop   07/25/18 1200  vancomycin (VANCOCIN) IVPB 750 mg/150 ml premix     750 mg 150 mL/hr over 60 Minutes Intravenous Every 12 hours 07/24/18 2352     07/25/18 1000  azithromycin (ZITHROMAX) tablet 500 mg  Status:  Discontinued     500 mg Oral Every 24 hours 07/24/18 1108 07/24/18 2335   07/24/18 2315  piperacillin-tazobactam (ZOSYN) IVPB 3.375 g     3.375 g 12.5 mL/hr over 240 Minutes Intravenous Every 8 hours 07/24/18 2300     07/24/18 2315  vancomycin (VANCOCIN) 2,000 mg in sodium chloride 0.9 % 500 mL IVPB     2,000 mg 250 mL/hr over 120 Minutes Intravenous  Once 07/24/18 2306 07/25/18 0214   07/24/18 2300  piperacillin-tazobactam (ZOSYN) IVPB 3.375 g  Status:  Discontinued     3.375 g 100 mL/hr over 30 Minutes Intravenous Every 8 hours 07/24/18 2256 07/24/18 2300   07/24/18 2300  vancomycin (VANCOCIN) 1,500 mg in sodium chloride 0.9 % 500 mL IVPB  Status:  Discontinued     1,500 mg 250 mL/hr over 120 Minutes Intravenous Every 12 hours 07/24/18 2256 07/24/18 2306   07/24/18 1115  cefTRIAXone (ROCEPHIN) 1 g in sodium chloride 0.9 % 100 mL IVPB  Status:  Discontinued     1 g 200 mL/hr over 30 Minutes Intravenous Every 24 hours 07/24/18 1101 07/24/18 2256   07/24/18 1115  azithromycin (ZITHROMAX) tablet 500 mg  Status:  Discontinued     500 mg Oral Every 24 hours 07/24/18 1101 07/24/18 1108   07/24/18 0930  cefTRIAXone (ROCEPHIN) 1 g in sodium chloride 0.9 % 100 mL IVPB     1 g 200 mL/hr over 30 Minutes Intravenous  Once 07/24/18 0928 07/24/18 1017   07/24/18 0930  azithromycin (ZITHROMAX) 500 mg in sodium chloride 0.9 % 250 mL IVPB     500 mg 250 mL/hr over 60 Minutes Intravenous  Once 07/24/18 0928 07/24/18 1200      REVIEW  OF SYSTEMS:  Const: subjective fever, negative chills, negative weight loss Eyes: negative diplopia or visual changes, negative eye pain ENT: negative coryza, negative sore throat Resp: +++ cough, blood in sputum , ++dyspnea Cards: negative for chest pain, palpitations, lower extremity edema GU: negative for frequency, dysuria and hematuria GI: Negative  for abdominal pain, diarrhea, bleeding, constipation Skin: negative for rash and pruritus Heme: negative for easy bruising and gum/nose bleeding MS:  muscle weakness Neurolo:negative for headaches, dizziness, vertigo, memory problems  Psych: negative for feelings of anxiety, depression  Endocrine: no polyuria, polydipsia Allergy/Immunology- negative for any medication or food allergies ?  Objective:  VITALS:  BP 120/86 (BP Location: Right Arm)   Pulse (!) 112   Temp 99.2 F (37.3 C) (Oral)   Resp 18   Ht 5\' 9"  (1.753 m)   Wt 69.1 kg   SpO2 94%   BMI 22.51 kg/m  PHYSICAL EXAM:  General: Alert, cooperative, some resp  distress, appears stated age.  Head: Normocephalic, without obvious abnormality, atraumatic. Eyes: Conjunctivae clear, anicteric sclerae. Pupils are equal ENT Nares normal. No drainage or sinus tenderness. Lips, mucosa, and tongue normal. No Thrush Neck: Supple, symmetrical, no adenopathy, thyroid: non tender no carotid bruit and no JVD. Back: No CVA tenderness. Lungs: Clear to auscultation bilaterally. No Wheezing or Rhonchi. No rales. Heart: Regular rate and rhythm, no murmur, rub or gallop. Abdomen: Soft, non-tender,not distended. Bowel sounds normal. No masses Extremities: atraumatic, no cyanosis. No edema. Early  clubbing, dry skin Skin: No rashes or lesions. Or bruising Lymph: Cervical, supraclavicular normal. Neurologic: Grossly non-focal Pertinent Labs Lab Results CBC    Component Value Date/Time   WBC 4.1 07/25/2018 0255   RBC 5.13 07/25/2018 0255   HGB 16.9 07/25/2018 0255   HCT 50.5  07/25/2018 0255   PLT 163 07/25/2018 0255   MCV 98.4 07/25/2018 0255   MCH 32.9 07/25/2018 0255   MCHC 33.5 07/25/2018 0255   RDW 14.1 07/25/2018 0255   LYMPHSABS 0.4 (L) 07/25/2018 0255   MONOABS 0.1 07/25/2018 0255   EOSABS 0.0 07/25/2018 0255   BASOSABS 0.0 07/25/2018 0255    CMP Latest Ref Rng & Units 07/25/2018 07/24/2018  Glucose 70 - 99 mg/dL 68(L) 102(H)  BUN 6 - 20 mg/dL 21(H) 21(H)  Creatinine 0.61 - 1.24 mg/dL 0.97 1.03  Sodium 135 - 145 mmol/L 133(L) 131(L)  Potassium 3.5 - 5.1 mmol/L 4.2 3.7  Chloride 98 - 111 mmol/L 99 91(L)  CO2 22 - 32 mmol/L 20(L) 25  Calcium 8.9 - 10.3 mg/dL 6.8(L) 8.3(L)  Total Protein 6.5 - 8.1 g/dL 6.8 8.1  Total Bilirubin 0.3 - 1.2 mg/dL 1.5(H) 2.1(H)  Alkaline Phos 38 - 126 U/L 18(L) 28(L)  AST 15 - 41 U/L 45(H) 48(H)  ALT 0 - 44 U/L 23 30      Microbiology: Recent Results (from the past 240 hour(s))  Blood culture (routine x 2)     Status: None (Preliminary result)   Collection Time: 07/24/18  8:54 AM  Result Value Ref Range Status   Specimen Description BLOOD LEFT ANTECUBITAL  Final   Special Requests   Final    BOTTLES DRAWN AEROBIC AND ANAEROBIC Blood Culture results may not be optimal due to an excessive volume of blood received in culture bottles   Culture   Final    NO GROWTH < 24 HOURS Performed at [facility], [address], [place], [place] [address]    Report Status PENDING  Incomplete  Blood culture (routine x 2)     Status: None (Preliminary result)   Collection Time: 07/24/18  8:54 AM  Result Value Ref Range Status   Specimen Description BLOOD BLOOD LEFT FOREARM  Final   Special Requests   Final    BOTTLES DRAWN AEROBIC AND ANAEROBIC Blood Culture  adequate volume   Culture   Final    NO GROWTH < 24 HOURS Performed at [facility], [address] [place], [place] [address]    Report Status PENDING  Incomplete  Respiratory Panel by PCR     Status: None   Collection Time: 07/24/18  8:54 AM   Result Value Ref Range Status   Adenovirus NOT DETECTED NOT DETECTED Final   Coronavirus 229E NOT DETECTED NOT DETECTED Final    Comment: (NOTE) The Coronavirus on the Respiratory Panel, DOES NOT test for the novel  Coronavirus (2019 nCoV)    Coronavirus HKU1 NOT DETECTED NOT DETECTED Final   Coronavirus NL63 NOT DETECTED NOT DETECTED Final   Coronavirus OC43 NOT DETECTED NOT DETECTED Final   Metapneumovirus NOT DETECTED NOT DETECTED Final   Rhinovirus / Enterovirus NOT DETECTED NOT DETECTED Final   Influenza A NOT DETECTED NOT DETECTED Final   Influenza B NOT DETECTED NOT DETECTED Final   Parainfluenza Virus 1 NOT DETECTED NOT DETECTED Final   Parainfluenza Virus 2 NOT DETECTED NOT DETECTED Final   Parainfluenza Virus 3 NOT DETECTED NOT DETECTED Final   Parainfluenza Virus 4 NOT DETECTED NOT DETECTED Final   Respiratory Syncytial Virus NOT DETECTED NOT DETECTED Final   Bordetella pertussis NOT DETECTED NOT DETECTED Final   Chlamydophila pneumoniae NOT DETECTED NOT DETECTED Final   Mycoplasma pneumoniae NOT DETECTED NOT DETECTED Final    Comment: Performed at [facility], [address] [place], [place] [address]    IMAGING RESULTS:    Bilateral pulmonary infiltrates.  I have personally reviewed the films ? Impression/Recommendation ? 58 y.o.male   a smoker presents with 2 day of generalized weakness and sob and blood in sputum and 2 months of cough which he thought was his allergies , and 1-2 months of subjective fever which he thought was hay fever. ? ?CAP with Multifocal infiltrate: prolific rusty sputum goes in favor of a bacterial process. Organisms like pneumococcus, Staph aureus, klebsiella are in D.D PJP does not usually cause purulent sputum MTB is  less likely- but remotely possible patient already has 2 sputums sent,   Viral pneumonia like COVID 19 is less likely unless this is secondary bacterial infection as well  Currently on vanco and zosyn- will Dc  zosyn and andceftriaxone and azithromycin  Severe leucopenia- could be due to severe infection but need to r/o HIV MRSA nares HIV Beta D glucan LDH Tests have been ordered ___________________________________________________ Discussed with patient and his nurse

## 2018-07-25 NOTE — Consult Note (Signed)
PHARMACY CONSULT NOTE - FOLLOW UP  Pharmacy Consult for Electrolyte Monitoring and Replacement   Recent Labs: Potassium (mmol/L)  Date Value  07/25/2018 4.2   Magnesium (mg/dL)  Date Value  07/25/2018 2.0   Calcium (mg/dL)  Date Value  07/25/2018 6.8 (L)   Albumin (g/dL)  Date Value  07/25/2018 3.0 (L)   Phosphorus (mg/dL)  Date Value  07/25/2018 3.5   Sodium (mmol/L)  Date Value  07/25/2018 133 (L)   Assessment: 59 year old male with history of tobacco dependence who presents to the emergency room due to shortness of breath and cough. Labs reveal severe hypomagnesemia. He received 4 grams IV magnesium sulfate after the above labs were drawn  Plan:  3/15 1600 Mg: 2.0. No additional replacement needed at this time.   F/U labs ordered in the AM. Pharmacy will continue to replace as needed.   Pernell Dupre, PharmD, BCPS Clinical Pharmacist 07/25/2018 5:08 PM

## 2018-07-25 NOTE — Consult Note (Signed)
PHARMACY CONSULT NOTE - FOLLOW UP  Pharmacy Consult for Electrolyte Monitoring and Replacement   Recent Labs: Potassium (mmol/L)  Date Value  07/25/2018 4.2   Magnesium (mg/dL)  Date Value  07/25/2018 0.9 (LL)   Calcium (mg/dL)  Date Value  07/25/2018 6.8 (L)   Albumin (g/dL)  Date Value  07/25/2018 3.0 (L)   Phosphorus (mg/dL)  Date Value  07/25/2018 3.5   Sodium (mmol/L)  Date Value  07/25/2018 133 (L)   Assessment: 58 year old male with history of tobacco dependence who presents to the emergency room due to shortness of breath and cough. Labs reveal severe hypomagnesemia. He received 4 grams IV magnesium sulfate after the above labs were drawn  Plan:  -- magnesium level has been ordered for 1400 to assess the response to previous replacement therapy -- pharmacy will replace to correct electrolyte abnormalities  Dallie Piles ,PharmD Clinical Pharmacist 07/25/2018 3:08 PM

## 2018-07-25 NOTE — Progress Notes (Signed)
I called the nurse A.C. regarding lab's request that I draw this patient's blood for some labs. Informed by the [facility]. that lab personnel can still draw this patient's labs, but that I need to assist them prior and post in the prep room with their P.P.E. for today. Stated to her that I was more than willing to do that. I then relayed this info to a lab tech in the lab and asked if they would relay it to the tech up on the floor. I tried to call him / her but I didn't get an answer. Awaiting their arrival so I can assist them at this time. Wenda Low [facility]

## 2018-07-25 NOTE — Progress Notes (Signed)
Initial Nutrition Assessment  DOCUMENTATION CODES:   Not applicable  INTERVENTION:  Provide Ensure Enlive po BID, each supplement provides 350 kcal and 20 grams of protein. Patient prefers strawberry.  Encouraged adequate intake of calories and protein at meals.  NUTRITION DIAGNOSIS:   Inadequate oral intake related to decreased appetite as evidenced by per patient/family report.  GOAL:   Patient will meet greater than or equal to 90% of their needs  MONITOR:   PO intake, Supplement acceptance, Labs, Weight trends, I & O's  REASON FOR ASSESSMENT:   Malnutrition Screening Tool    ASSESSMENT:   58 year old male with PMHx of asthma, arthritis admitted with sepsis, multifocal PNA, acute exacerbation of COPD, being ruled out for COVID-19.   Called phone in patient's room to obtain nutrition and weight history. He reports he has had a decreased appetite and intake since 3/11. He reports it is related to shortness of breath and fatigue. He typically eats one large meal per day in the evening (meat with 2-3 vegetables/sides). Here he reports he is eating </=50% of meals. He is amenable to drinking ONS until appetite returns. Denies any food allergies or intolerances.  Patient reports his UBW is 255 lbs and that he has lost a few lbs over the past week. Patient currently 69.1 kg (152.4 lbs).   Medications reviewed and include: Colace 100 mg BID, gabapentin, Solu-Medrol 40 mg Q12hrs IV, nicotine patch, Zosyn, vancomycin, NS @ 100 mL/hr.  Labs reviewed: Sodium 133, CO2 20, BUN 21, Magnesium 0.9.  Discussed with RN.  Diet Order:   Diet Order            Diet regular Room service appropriate? Yes; Fluid consistency: Thin  Diet effective now             EDUCATION NEEDS:   Education needs have been addressed  Skin:  Skin Assessment: Reviewed RN Assessment  Last BM:  07/24/2018 per chart  Height:   Ht Readings from Last 1 Encounters:  07/24/18 5\' 9"  (1.753 m)   Weight:    Wt Readings from Last 1 Encounters:  07/25/18 69.1 kg   Ideal Body Weight:  72.7 kg  BMI:  Body mass index is 22.51 kg/m.  Estimated Nutritional Needs:   Kcal:  1805-2110 (MSJ x 1.2-1.4)  Protein:  85-95 grams (1.2-1.4 grams/kg)  Fluid:  1.8-2.1 L/day (1 mL/kcal)  Willey Blade, MS, RD, LDN Office: [phone] Pager: [phone] After Hours/Weekend Pager: [phone]

## 2018-07-25 NOTE — Plan of Care (Signed)
  Problem: Clinical Measurements: Goal: Ability to maintain clinical measurements within normal limits will improve Outcome: Not Progressing Note:  Most recent magnesium level was critically low at only .9. Will continue to monitor lab values. Wenda Low [facility]

## 2018-07-25 NOTE — Progress Notes (Signed)
[place] [facility] [place] NAME: Edward Macias    MR#:  [id number]  DATE OF BIRTH:  24-May-1960  SUBJECTIVE:  Patient with cough SOB wheezing Still smokes Feels tight in chest No longer with hemoptysis  Denies recent travel history.  Denies exposure to anyone with novel coronavirus.   REVIEW OF SYSTEMS:    Review of Systems  Constitutional: Negative for fever, chills weight loss HENT: Negative for ear pain, nosebleeds, congestion, facial swelling, rhinorrhea, neck pain, neck stiffness and ear discharge.   Respiratory: Positive for cough, shortness of breath, wheezing  Cardiovascular: Negative for chest pain, palpitations and leg swelling.  Gastrointestinal: Negative for heartburn, abdominal pain, vomiting, diarrhea or consitpation Genitourinary: Negative for dysuria, urgency, frequency, hematuria Musculoskeletal: Negative for back pain or joint pain Neurological: Negative for dizziness, seizures, syncope, focal weakness,  numbness and headaches.  Hematological: Does not bruise/bleed easily.  Psychiatric/Behavioral: Negative for hallucinations, confusion, dysphoric mood    Tolerating Diet: yes      DRUG ALLERGIES:  No Known Allergies  VITALS:  Blood pressure 115/79, pulse (!) 116, temperature 98.2 F (36.8 C), temperature source Oral, resp. rate (!) 25, height 5\' 9"  (1.753 m), weight 69.1 kg, SpO2 95 %.  PHYSICAL EXAMINATION:  Constitutional: Appears well-developed and well-nourished. No distress. HENT: Normocephalic. Marland Kitchen Oropharynx is clear and moist.  Eyes: Conjunctivae and EOM are normal. PERRLA, no scleral icterus.  Neck: Normal ROM. Neck supple. No JVD. No tracheal deviation. CVS: RRR, S1/S2 +, no murmurs, no gallops, no carotid bruit.  Pulmonary: Patient sounds very tight.  Crackles audible on the right lower base and left lower base. Mild wheezing Abdominal: Soft. BS +,  no distension, tenderness, rebound or guarding.   Musculoskeletal: Normal range of motion. No edema and no tenderness.  Neuro: Alert. CN 2-12 grossly intact. No focal deficits. Skin: Skin is warm and dry. No rash noted. Psychiatric: Normal mood and affect.      LABORATORY PANEL:   CBC Recent Labs  Lab 07/25/18 0255  WBC 4.1  HGB 16.9  HCT 50.5  PLT 163   ------------------------------------------------------------------------------------------------------------------  Chemistries  Recent Labs  Lab 07/25/18 0255  NA 133*  K 4.2  CL 99  CO2 20*  GLUCOSE 68*  BUN 21*  CREATININE 0.97  CALCIUM 6.8*  MG 0.9*  AST 45*  ALT 23  ALKPHOS 18*  BILITOT 1.5*   ------------------------------------------------------------------------------------------------------------------  Cardiac Enzymes No results for input(s): TROPONINI in the last 168 hours. ------------------------------------------------------------------------------------------------------------------  RADIOLOGY:  Dg Chest 2 View  Result Date: 07/24/2018 CLINICAL DATA:  Worsening cough for 2 months. Fevers. Bloody sputum. EXAM: CHEST - 2 VIEW COMPARISON:  None. FINDINGS: Bilateral pulmonary infiltrates are identified in the right infrahilar and left perihilar regions. No pneumothorax. The cardiomediastinal silhouette is normal. No nodules or masses identified. No other acute abnormalities. IMPRESSION: Bilateral pulmonary infiltrates. Multifocal pneumonia is a leading consideration given history of fevers. Recommend clinical correlation and short-term follow-up after treatment to ensure resolution. Electronically Signed   By: Dorise Bullion III M.D   On: 07/24/2018 09:14   Ct Chest W Contrast  Result Date: 07/25/2018 CLINICAL DATA:  Short of breath with productive cough EXAM: CT CHEST WITH CONTRAST TECHNIQUE: Multidetector CT imaging of the chest was performed during intravenous contrast administration. CONTRAST:  102mL OMNIPAQUE IOHEXOL 300 MG/ML  SOLN COMPARISON:   Chest x-ray 07/24/2018 FINDINGS: Cardiovascular: Mild aortic atherosclerosis. No aneurysmal dilatation. Mild coronary vascular calcification. Normal heart size. Trace pericardial effusion. Mediastinum/Nodes:  Midline trachea. No thyroid mass. Enlarged subcarinal lymph node measuring up to 13 mm. Esophagus within normal limits. Lungs/Pleura: Small left-sided pleural effusion. Extensive left lower lobe consolidation. Partial consolidation in the left upper lobe and perihilar region. Partial consolidation with adjacent ground-glass density in the right lower lobe posteriorly. Other scattered foci of ground-glass density in the right middle lobe. Mild emphysema. No pneumothorax. Upper Abdomen: No acute abnormality. Musculoskeletal: Degenerative changes without acute or suspicious abnormality IMPRESSION: 1. Small left-sided pleural effusion with extensive consolidation in the left lower lobe. Additional areas of consolidation and mild adjacent ground-glass density in the left upper lobe and right lower lobe with scattered foci of ground-glass density in the right middle lobe, findings are felt consistent with multifocal pneumonia. 2. Mild subcarinal adenopathy, likely reactive 3. Mild emphysema Aortic Atherosclerosis (ICD10-I70.0) and Emphysema (ICD10-J43.9). Electronically Signed   By: Donavan Foil M.D.   On: 07/25/2018 02:20     ASSESSMENT AND PLAN:   58 year old male with history of tobacco dependence who presents to the emergency room due to shortness of breath and cough.  1.  Acute hypoxic respiratory failure in the setting of multifocal pneumonia and COPD exacerbation. Patient is being ruled out for COVID 19 given bilateral pulmonary infiltrates, leukopenia.  Continue strict droplet precautions Consultation with ID requested Wean oxygen as tolerated AFB also ordered as well as quantiferon  2.  Sepsis due to multifocal pneumonia: Patient presents with hypotension, tachycardia and leukopenia  Follow  lactic acid level continue Zosyn and vancomycin for now. Follow-up on MRSA PCR and if this is negative consider discontinuation of vancomycin Follow-up on sputum culture Will need repeat imaging in 4 to 6 weeks given history of tobacco dependence. Procalcitonin extremely elevated.   3.  Acute COPD exacerbation: Start steroids as patient with significant tightness on examination today Started duo nebs and inhaler  4.Tobacco dependence: Patient is encouraged to quit smoking and willing to attempt to quit was assessed. Patient highly motivated.Counseling was provided for 4 minutes. Patient would like nicotine patch  5.  Hyponatremia: This is slowly improving  6.  Hypo-magnesium: We will replete and recheck in a.m.     Management plans discussed with the patient and he is in agreement.  CODE STATUS: full  TOTAL TIME TAKING CARE OF THIS PATIENT: 30 minutes.     POSSIBLE D/C 3-5 days, DEPENDING ON CLINICAL CONDITION.   Fayette Hamada M.D on 07/25/2018 at 9:54 AM  Between 7am to 6pm - Pager - [phone] After 6pm go to [email or web address] - password [email or web address] [facility]  Office  [phone]  CC: Primary care physician; Patient, No Pcp Per  Note: This dictation was prepared with Dragon dictation along with smaller phrase technology. Any transcriptional errors that result from this process are unintentional.

## 2018-07-26 DIAGNOSIS — Z720 Tobacco use: Secondary | ICD-10-CM

## 2018-07-26 LAB — GLUCOSE, CAPILLARY: Glucose-Capillary: 129 mg/dL — ABNORMAL HIGH (ref 70–99)

## 2018-07-26 LAB — BASIC METABOLIC PANEL
Anion gap: 6 (ref 5–15)
BUN: 17 mg/dL (ref 6–20)
CO2: 24 mmol/L (ref 22–32)
Calcium: 6.5 mg/dL — ABNORMAL LOW (ref 8.9–10.3)
Chloride: 103 mmol/L (ref 98–111)
Creatinine, Ser: 0.73 mg/dL (ref 0.61–1.24)
GFR calc Af Amer: >60 mL/min (ref >60)
GFR calc non Af Amer: >60 mL/min (ref >60)
Glucose, Bld: 196 mg/dL — ABNORMAL HIGH (ref 70–99)
Potassium: 3.7 mmol/L (ref 3.5–5.1)
Sodium: 133 mmol/L — ABNORMAL LOW (ref 135–145)

## 2018-07-26 LAB — LEGIONELLA PNEUMOPHILA SEROGP 1 UR AG: L. pneumophila Serogp 1 Ur Ag: NEGATIVE

## 2018-07-26 LAB — MAGNESIUM: MAGNESIUM: 2.1 mg/dL (ref 1.7–2.4)

## 2018-07-26 MED ORDER — GUAIFENESIN-DM 100-10 MG/5ML PO SYRP
5.0000 mL | ORAL_SOLUTION | ORAL | Status: DC | PRN
  Administered 2018-07-26 – 2018-07-27 (×2): 5 mL via ORAL
  Filled 2018-07-26 (×4): qty 5

## 2018-07-26 NOTE — Progress Notes (Signed)
[facility] [place] NAME: Edward Macias    MR#:  [id number]  DATE OF BIRTH:  April 29, 1961  SUBJECTIVE:  Patient looks and feels much better than yesterday.  Still has a cough however shortness of breath is improved.  REVIEW OF SYSTEMS:    Review of Systems  Constitutional: Negative for fever, chills weight loss HENT: Negative for ear pain, nosebleeds, congestion, facial swelling, rhinorrhea, neck pain, neck stiffness and ear discharge.   Respiratory: Positive for cough, shortness of breath, wheezing (all improved) Cardiovascular: Negative for chest pain, palpitations and leg swelling.  Gastrointestinal: Negative for heartburn, abdominal pain, vomiting, diarrhea or consitpation Genitourinary: Negative for dysuria, urgency, frequency, hematuria Musculoskeletal: Negative for back pain or joint pain Neurological: Negative for dizziness, seizures, syncope, focal weakness,  numbness and headaches.  Hematological: Does not bruise/bleed easily.  Psychiatric/Behavioral: Negative for hallucinations, confusion, dysphoric mood    Tolerating Diet: yes      DRUG ALLERGIES:  No Known Allergies  VITALS:  Blood pressure 117/89, pulse 100, temperature 97.8 F (36.6 C), temperature source Oral, resp. rate 18, height 5\' 9"  (1.753 m), weight 68.7 kg, SpO2 100 %.  PHYSICAL EXAMINATION:  Constitutional: Appears well-developed and well-nourished. No distress. HENT: Normocephalic. Marland Kitchen Oropharynx is clear and moist.  Eyes: Conjunctivae and EOM are normal. PERRLA, no scleral icterus.  Neck: Normal ROM. Neck supple. No JVD. No tracheal deviation. CVS: RRR, S1/S2 +, no murmurs, no gallops, no carotid bruit.  Pulmonary: Crackles right and left lung base without wheezing.  Good airflow Abdominal: Soft. BS +,  no distension, tenderness, rebound or guarding.  Musculoskeletal: Normal range of motion. No edema and no tenderness.  Neuro: Alert. CN 2-12 grossly  intact. No focal deficits. Skin: Skin is warm and dry. No rash noted. Psychiatric: Normal mood and affect.      LABORATORY PANEL:   CBC Recent Labs  Lab 07/25/18 0255  WBC 4.1  HGB 16.9  HCT 50.5  PLT 163   ------------------------------------------------------------------------------------------------------------------  Chemistries  Recent Labs  Lab 07/25/18 0255  07/26/18 0421  NA 133*  --  133*  K 4.2  --  3.7  CL 99  --  103  CO2 20*  --  24  GLUCOSE 68*  --  196*  BUN 21*  --  17  CREATININE 0.97  --  0.73  CALCIUM 6.8*  --  6.5*  MG 0.9*   < > 2.1  AST 45*  --   --   ALT 23  --   --   ALKPHOS 18*  --   --   BILITOT 1.5*  --   --    < > = values in this interval not displayed.   ------------------------------------------------------------------------------------------------------------------  Cardiac Enzymes No results for input(s): TROPONINI in the last 168 hours. ------------------------------------------------------------------------------------------------------------------  RADIOLOGY:  Ct Chest W Contrast  Result Date: 07/25/2018 CLINICAL DATA:  Short of breath with productive cough EXAM: CT CHEST WITH CONTRAST TECHNIQUE: Multidetector CT imaging of the chest was performed during intravenous contrast administration. CONTRAST:  34mL OMNIPAQUE IOHEXOL 300 MG/ML  SOLN COMPARISON:  Chest x-ray 07/24/2018 FINDINGS: Cardiovascular: Mild aortic atherosclerosis. No aneurysmal dilatation. Mild coronary vascular calcification. Normal heart size. Trace pericardial effusion. Mediastinum/Nodes: Midline trachea. No thyroid mass. Enlarged subcarinal lymph node measuring up to 13 mm. Esophagus within normal limits. Lungs/Pleura: Small left-sided pleural effusion. Extensive left lower lobe consolidation. Partial consolidation in the left upper lobe and perihilar region. Partial consolidation with adjacent ground-glass  density in the right lower lobe posteriorly. Other  scattered foci of ground-glass density in the right middle lobe. Mild emphysema. No pneumothorax. Upper Abdomen: No acute abnormality. Musculoskeletal: Degenerative changes without acute or suspicious abnormality IMPRESSION: 1. Small left-sided pleural effusion with extensive consolidation in the left lower lobe. Additional areas of consolidation and mild adjacent ground-glass density in the left upper lobe and right lower lobe with scattered foci of ground-glass density in the right middle lobe, findings are felt consistent with multifocal pneumonia. 2. Mild subcarinal adenopathy, likely reactive 3. Mild emphysema Aortic Atherosclerosis (ICD10-I70.0) and Emphysema (ICD10-J43.9). Electronically Signed   By: Donavan Foil M.D.   On: 07/25/2018 02:20     ASSESSMENT AND PLAN:   58 year old male with history of tobacco dependence who presents to the emergency room due to shortness of breath and cough.  1.  Acute hypoxic respiratory failure in the setting of multifocal pneumonia and COPD exacerbation. Patient is being ruled out for COVID 19 given bilateral pulmonary infiltrates, leukopenia. COVID 19 less likely Continue strict droplet precautions Patient weaned off of oxygen AFB also ordered as well as quantiferon  2.  Sepsis due to multifocal pneumonia: Patient presents with hypotension, tachycardia and leukopenia  Continue Rocephin and azithromycin as per ID. Follow-up on MRSA PCR and if this is negative consider discontinuation of vancomycin Follow-up on sputum culture Will need repeat imaging in 4 to 6 weeks given history of tobacco dependence. Follow-up on results of all testing.   3.  Acute COPD exacerbation:  Continue IV steroids, nebs and inhalers. Plan to transition to oral steroids in a.m. if patient continues to improve   4.Tobacco dependence: Patient is encouraged to quit smoking and willing to attempt to quit was assessed. Patient highly motivated.Counseling was provided for 4  minutes. Patient has nicotine patch.   5.  Hyponatremia: This is stable. 6.  Hypo-magnesium: Improved with replacement   Management plans discussed with the patient and he is in agreement.  CODE STATUS: full  TOTAL TIME TAKING CARE OF THIS PATIENT: 24 minutes.     POSSIBLE D/C 1 to 2 days DEPENDING ON CLINICAL CONDITION.   Ellana Kawa M.D on 07/26/2018 at 12:38 PM  Between 7am to 6pm - Pager - [phone] After 6pm go to [email or web address] - password [email or web address] [facility]  Office  [phone]  CC: Primary care physician; Patient, No Pcp Per  Note: This dictation was prepared with Dragon dictation along with smaller phrase technology. Any transcriptional errors that result from this process are unintentional.

## 2018-07-26 NOTE — Consult Note (Signed)
PHARMACY CONSULT NOTE - FOLLOW UP  Pharmacy Consult for Electrolyte Monitoring and Replacement   Recent Labs: Potassium (mmol/L)  Date Value  07/26/2018 3.7   Magnesium (mg/dL)  Date Value  07/26/2018 2.1   Calcium (mg/dL)  Date Value  07/26/2018 6.5 (L)   Albumin (g/dL)  Date Value  07/25/2018 3.0 (L)   Phosphorus (mg/dL)  Date Value  07/25/2018 3.5   Sodium (mmol/L)  Date Value  07/26/2018 133 (L)   Assessment: 58 year old male with history of tobacco dependence who presents to the emergency room due to shortness of breath and cough. Labs reveal severe hypomagnesemia. He originally received 4 grams IV magnesium sulfate after the above labs were drawn.     Electrolytes are currently wnl's  Plan:  3/15 1600 Mg: 2.0. No additional replacement needed at this time. 3/16 0421 Mg: 2.1 No additional replacement needed at this time   Will check potassium one more day with am labs  Lu Duffel, PharmD, BCPS Clinical Pharmacist 07/26/2018 7:23 AM

## 2018-07-26 NOTE — Progress Notes (Signed)
Date of Admission:  07/24/2018   07/26/18  ID: Juandavid Dallman is a 58 y.o. male  Active Problems:   Sepsis ([employer]) Multilobar pneumonia   Subjective: Feeling better Less sob Cough slightly better clearing color  Medications:  . docusate sodium  100 mg Oral BID  . enoxaparin (LOVENOX) injection  40 mg Subcutaneous Q24H  . feeding supplement (ENSURE ENLIVE)  237 mL Oral BID BM  . gabapentin  100 mg Oral TID  . Ipratropium-Albuterol  1 puff Inhalation Q4H  . methylPREDNISolone (SOLU-MEDROL) injection  40 mg Intravenous Q12H  . metoprolol tartrate  25 mg Oral BID  . mometasone-formoterol  2 puff Inhalation BID  . nicotine  21 mg Transdermal Daily    Objective: Vital signs in last 24 hours: Temp:  [97.8 F (36.6 C)-100.1 F (37.8 C)] 100.1 F (37.8 C) (03/16 1542) Pulse Rate:  [94-112] 102 (03/16 1542) Resp:  [18-20] 18 (03/16 1542) BP: (105-120)/(76-89) 116/85 (03/16 1542) SpO2:  [91 %-100 %] 100 % (03/16 1542) Weight:  [68.7 kg] 68.7 kg (03/16 0451)   Lab Results Recent Labs    07/24/18 0853 07/25/18 0255 07/26/18 0421  WBC 1.6* 4.1  --   HGB 17.2* 16.9  --   HCT 48.8 50.5  --   NA 131* 133* 133*  K 3.7 4.2 3.7  CL 91* 99 103  CO2 25 20* 24  BUN 21* 21* 17  CREATININE 1.03 0.97 0.73   Liver Panel Recent Labs    07/24/18 0853 07/25/18 0255  PROT 8.1 6.8  ALBUMIN 3.7 3.0*  AST 48* 45*  ALT 30 23  ALKPHOS 28* 18*  BILITOT 2.1* 1.5*   Sedimentation Rate No results for input(s): ESRSEDRATE in the last 72 hours. C-Reactive Protein No results for input(s): CRP in the last 72 hours.  Microbiology:  Studies/Results: Ct Chest W Contrast  Result Date: 07/25/2018 CLINICAL DATA:  Short of breath with productive cough EXAM: CT CHEST WITH CONTRAST TECHNIQUE: Multidetector CT imaging of the chest was performed during intravenous contrast administration. CONTRAST:  47mL OMNIPAQUE IOHEXOL 300 MG/ML  SOLN COMPARISON:  Chest x-ray 07/24/2018 FINDINGS:  Cardiovascular: Mild aortic atherosclerosis. No aneurysmal dilatation. Mild coronary vascular calcification. Normal heart size. Trace pericardial effusion. Mediastinum/Nodes: Midline trachea. No thyroid mass. Enlarged subcarinal lymph node measuring up to 13 mm. Esophagus within normal limits. Lungs/Pleura: Small left-sided pleural effusion. Extensive left lower lobe consolidation. Partial consolidation in the left upper lobe and perihilar region. Partial consolidation with adjacent ground-glass density in the right lower lobe posteriorly. Other scattered foci of ground-glass density in the right middle lobe. Mild emphysema. No pneumothorax. Upper Abdomen: No acute abnormality. Musculoskeletal: Degenerative changes without acute or suspicious abnormality IMPRESSION: 1. Small left-sided pleural effusion with extensive consolidation in the left lower lobe. Additional areas of consolidation and mild adjacent ground-glass density in the left upper lobe and right lower lobe with scattered foci of ground-glass density in the right middle lobe, findings are felt consistent with multifocal pneumonia. 2. Mild subcarinal adenopathy, likely reactive 3. Mild emphysema Aortic Atherosclerosis (ICD10-I70.0) and Emphysema (ICD10-J43.9). Electronically Signed   By: Donavan Foil M.D.   On: 07/25/2018 02:20     Assessment/Plan: 58 y.o.male   a smoker presents with 2 day of generalized weakness and sob and blood in sputum and 2 months of cough which he thought was his allergies , and 1-2 months of subjective fever which he thought was hay fever. ? ?CAP with Multifocal infiltrate: prolific rusty sputum goes in  favor of a bacterial process., getting better Organisms like pneumococcus, Staph aureus, klebsiella are in D.D PJP does not usually cause purulent sputum MTB is  less likely- but remotely possible patient already has 3 sputums sent,   Viral pneumonia like COVID 19 is less likely unless this is secondary bacterial  infection as well  Currently on ceftriaxone and azithromycin  On solumedrol - consider stopping  Severe leucopenia- could be due to severe infection , improving MRSA nares-Neg HIV-P Beta D glucan-P LDH-N Test results pending If afb is neg tomorrow- remove airborne. Discussed the management with the patient

## 2018-07-27 ENCOUNTER — Inpatient Hospital Stay: Payer: PRIVATE HEALTH INSURANCE

## 2018-07-27 LAB — BASIC METABOLIC PANEL
ANION GAP: 7 (ref 5–15)
BUN: 14 mg/dL (ref 6–20)
CO2: 22 mmol/L (ref 22–32)
Calcium: 7.2 mg/dL — ABNORMAL LOW (ref 8.9–10.3)
Chloride: 107 mmol/L (ref 98–111)
Creatinine, Ser: 0.66 mg/dL (ref 0.61–1.24)
GFR calc Af Amer: >60 mL/min (ref >60)
GFR calc non Af Amer: >60 mL/min (ref >60)
Glucose, Bld: 164 mg/dL — ABNORMAL HIGH (ref 70–99)
Potassium: 3.6 mmol/L (ref 3.5–5.1)
Sodium: 136 mmol/L (ref 135–145)

## 2018-07-27 LAB — GLUCOSE, CAPILLARY: Glucose-Capillary: 127 mg/dL — ABNORMAL HIGH (ref 70–99)

## 2018-07-27 LAB — HIV ANTIBODY (ROUTINE TESTING W REFLEX)
HIV Screen 4th Generation wRfx: NONREACTIVE
HIV Screen 4th Generation wRfx: NONREACTIVE

## 2018-07-27 LAB — CALCIUM, IONIZED: Calcium, Ionized, Serum: 3.7 mg/dL — ABNORMAL LOW (ref 4.5–5.6)

## 2018-07-27 MED ORDER — ENSURE ENLIVE PO LIQD: 237.0000 mL | Freq: Two times a day (BID) | ORAL | 12 refills | Status: DC

## 2018-07-27 MED ORDER — METOPROLOL TARTRATE 25 MG PO TABS: 25.0000 mg | ORAL_TABLET | Freq: Two times a day (BID) | ORAL | 0 refills | Status: DC

## 2018-07-27 MED ORDER — POTASSIUM CHLORIDE 20 MEQ PO PACK
20.0000 meq | PACK | Freq: Once | ORAL | Status: AC
  Administered 2018-07-27: 20 meq via ORAL
  Filled 2018-07-27: qty 1

## 2018-07-27 MED ORDER — NICOTINE 21 MG/24HR TD PT24: 21.0000 mg | MEDICATED_PATCH | Freq: Every day | TRANSDERMAL | 0 refills | Status: DC

## 2018-07-27 MED ORDER — LEVOFLOXACIN 750 MG PO TABS: 750.0000 mg | ORAL_TABLET | Freq: Every day | ORAL | 0 refills | Status: AC
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
## 2018-07-27 NOTE — Consult Note (Signed)
PHARMACY CONSULT NOTE - FOLLOW UP  Pharmacy Consult for Electrolyte Monitoring and Replacement   Recent Labs: Potassium (mmol/L)  Date Value  07/27/2018 3.6   Magnesium (mg/dL)  Date Value  07/26/2018 2.1   Calcium (mg/dL)  Date Value  07/27/2018 7.2 (L)   Albumin (g/dL)  Date Value  07/25/2018 3.0 (L)   Phosphorus (mg/dL)  Date Value  07/25/2018 3.5   Sodium (mmol/L)  Date Value  07/27/2018 136   Assessment: 58 year old male with history of tobacco dependence who presents to the emergency room due to shortness of breath and cough. Labs revealed severe hypomagnesemia and was replenished with 4 grams IV magnesium sulfate.  Followed his potassium for one additional day; electrolytes are currently wnl's. However, potassium has a slight downtrend.  Plan:  3/15 1600 Mg: 2.0. No additional replacement needed at this time. 3/16 0421 Mg: 2.1 No additional replacement needed at this time  3/17 0719 K+: 3.6 Will give 20 mEQ Potassium tablet x1 and repeat potassium.     Rowland Lathe, PharmD Clinical Pharmacist 07/27/2018 7:16 AM

## 2018-07-27 NOTE — Progress Notes (Signed)
ID CAP b/l multifocal- Urine positive for strep Pneumo antigen Sputum culture AFB Covid all pending Pt wants to go home IF so he will have to mask and stay at home in self quarantine. levaquin 500mg   for 10 days along with probiotic Form will have to be signed by him and instructions given to him about self quarantine Discussed with Dr. Benjie Karvonen and IP  He can follow up with me as Op in 2 weeks-call [phone] to make the appt

## 2018-07-27 NOTE — Plan of Care (Signed)
  Problem: Education: Goal: Knowledge of General Education information will improve Description Including pain rating scale, medication(s)/side effects and non-pharmacologic comfort measures Outcome: Progressing   Problem: Health Behavior/Discharge Planning: Goal: Ability to manage health-related needs will improve Outcome: Progressing   Problem: Clinical Measurements: Goal: Ability to maintain clinical measurements within normal limits will improve Outcome: Progressing Goal: Will remain free from infection Outcome: Progressing Goal: Diagnostic test results will improve Outcome: Progressing Goal: Respiratory complications will improve Outcome: Progressing Goal: Cardiovascular complication will be avoided Outcome: Progressing   Problem: Activity: Goal: Risk for activity intolerance will decrease Outcome: Progressing   Problem: Pain Managment: Goal: General experience of comfort will improve Outcome: Progressing   Problem: Safety: Goal: Ability to remain free from injury will improve Outcome: Progressing   Problem: Skin Integrity: Goal: Risk for impaired skin integrity will decrease Outcome: Progressing   Problem: Fluid Volume: Goal: Hemodynamic stability will improve Outcome: Progressing   Problem: Clinical Measurements: Goal: Diagnostic test results will improve Outcome: Progressing Goal: Signs and symptoms of infection will decrease Outcome: Progressing   Problem: Respiratory: Goal: Ability to maintain adequate ventilation will improve Outcome: Progressing

## 2018-07-28 ENCOUNTER — Telehealth: Payer: Self-pay | Admitting: Infectious Diseases

## 2018-07-28 LAB — CULTURE, RESPIRATORY W GRAM STAIN: Culture: NORMAL

## 2018-07-28 LAB — ACID FAST SMEAR (AFB, MYCOBACTERIA): Acid Fast Smear: NEGATIVE

## 2018-07-28 LAB — QUANTIFERON-TB GOLD PLUS (RQFGPL)
QuantiFERON Mitogen Value: 0.03 IU/mL
QuantiFERON Nil Value: 0.01 IU/mL
QuantiFERON TB1 Ag Value: 0.02 IU/mL
QuantiFERON TB2 Ag Value: 0.01 IU/mL

## 2018-07-28 LAB — QUANTIFERON-TB GOLD PLUS: QuantiFERON-TB Gold Plus: UNDETERMINED

## 2018-07-28 LAB — ACID FAST SMEAR (AFB)

## 2018-07-28 NOTE — Telephone Encounter (Signed)
Called patient's mobile number went to his voice mail- left a message saying that all his testes including corona virus and AFB were negative and he can come off quarantine, mask, isolation. Also talked to his daughter and told her the same

## 2018-07-29 LAB — CULTURE, BLOOD (ROUTINE X 2)
Culture: NO GROWTH
Culture: NO GROWTH
SPECIAL REQUESTS: ADEQUATE

## 2018-07-29 LAB — FUNGITELL, SERUM: Fungitell Result: <31 pg/mL (ref <80)

## 2018-08-02 LAB — NOVEL CORONAVIRUS, NAA (HOSPITAL ORDER, SEND-OUT TO REF LAB): SARS-COV-2, NAA: NOT DETECTED

## 2018-08-03 ENCOUNTER — Ambulatory Visit: Payer: PRIVATE HEALTH INSURANCE | Attending: Infectious Diseases | Admitting: Infectious Diseases

## 2018-08-03 ENCOUNTER — Encounter: Payer: Self-pay | Admitting: Infectious Diseases

## 2018-08-03 ENCOUNTER — Other Ambulatory Visit: Payer: Self-pay

## 2018-08-03 VITALS — BP 126/83 | HR 61 | Temp 97.8°F | Ht 69.0 in | Wt 157.0 lb

## 2018-08-03 DIAGNOSIS — F17210 Nicotine dependence, cigarettes, uncomplicated: Secondary | ICD-10-CM | POA: Diagnosis not present

## 2018-08-03 DIAGNOSIS — Z09 Encounter for follow-up examination after completed treatment for conditions other than malignant neoplasm: Secondary | ICD-10-CM | POA: Diagnosis not present

## 2018-08-03 DIAGNOSIS — Z8701 Personal history of pneumonia (recurrent): Secondary | ICD-10-CM | POA: Diagnosis not present

## 2018-08-03 DIAGNOSIS — J181 Lobar pneumonia, unspecified organism: Secondary | ICD-10-CM

## 2018-08-03 NOTE — Patient Instructions (Addendum)
You are here for follow up after hospital discharge- you had pneumonia in both lungs. You are on levaquin 750mg  once a day for 10 days. Complete the medication as prescribed You are doing better, no fever or cough, you are back to work Please do not smoke as your COPD will get worse. You need to get a PCP  Repeat CXR in 4 weeks

## 2018-08-03 NOTE — Progress Notes (Signed)
NAME: Edward Macias  DOB: 1961-02-15  MRN: [id number]  Date/Time: 08/03/2018 10:55 AM Here for follow up  After recent hospitalization Was admitted for pneumonia both lungs Discharged on 3/17 on 10 days of levaquin- COVID 19, AFB were all negative He is doing better He has no sob, cough is much improved, no fever or weakness. He has gone back to work. He has no side effects from the levaquin ? Past Medical History:  Diagnosis Date  . Arthritis   . Asthma    AS A CHILD-NO INHALERS    Past Surgical History:  Procedure Laterality Date  . INGUINAL HERNIA REPAIR Left 10/03/2016   Procedure: HERNIA REPAIR INGUINAL ADULT;  Surgeon: Leonie Green, MD;  Location: [facility] ORS;  Service: General;  Laterality: Left;  . NO PAST SURGERIES      Social History   Socioeconomic History  . Marital status: Single    Spouse name: Not on file  . Number of children: Not on file  . Years of education: Not on file  . Highest education level: Not on file  Occupational History  . Not on file  Social Needs  . Financial resource strain: Not on file  . Food insecurity:    Worry: Not on file    Inability: Not on file  . Transportation needs:    Medical: Not on file    Non-medical: Not on file  Tobacco Use  . Smoking status: Current Every Day Smoker    Packs/day: 1.00    Years: 30.00    Pack years: 30.00    Types: Cigarettes  . Smokeless tobacco: Never Used  Substance and Sexual Activity  . Alcohol use: No  . Drug use: No  . Sexual activity: Not on file  Lifestyle  . Physical activity:    Days per week: Not on file    Minutes per session: Not on file  . Stress: Not on file  Relationships  . Social connections:    Talks on phone: Not on file    Gets together: Not on file    Attends religious service: Not on file    Active member of club or organization: Not on file    Attends meetings of clubs or organizations: Not on file    Relationship status: Not on file  . Intimate partner  violence:    Fear of current or ex partner: Not on file    Emotionally abused: Not on file    Physically abused: Not on file    Forced sexual activity: Not on file  Other Topics Concern  . Not on file  Social History Narrative  . Not on file    No family history on file. No Known Allergies I? Current Outpatient Medications  Medication Sig Dispense Refill  . gabapentin (NEURONTIN) 100 MG capsule Take 100 mg by mouth 3 (three) times daily.    Marland Kitchen levofloxacin (LEVAQUIN) 750 MG tablet Take 1 tablet (750 mg total) by mouth daily for 10 days. 10 tablet 0  . loratadine (CLARITIN) 10 MG tablet Take 10 mg by mouth daily as needed for allergies or rhinitis.    . metoprolol tartrate (LOPRESSOR) 25 MG tablet Take 1 tablet (25 mg total) by mouth 2 (two) times daily. 60 tablet 0  . feeding supplement, ENSURE ENLIVE, (ENSURE ENLIVE) LIQD Take 237 mLs by mouth 2 (two) times daily between meals. (Patient not taking: Reported on 08/03/2018) 237 mL 12  . nicotine (NICODERM CQ - DOSED IN MG/24 HOURS) 21  mg/24hr patch Place 1 patch (21 mg total) onto the skin daily. (Patient not taking: Reported on 08/03/2018) 28 patch 0   No current facility-administered medications for this visit.      Abtx:  Anti-infectives (From admission, onward)   None      REVIEW OF SYSTEMS:  Const: negative fever, negative chills, negative weight loss Eyes: negative diplopia or visual changes, negative eye pain ENT: negative coryza, negative sore throat Resp: negative cough, hemoptysis, dyspnea Cards: negative for chest pain, palpitations, lower extremity edema GU: negative for frequency, dysuria and hematuria GI: Negative for abdominal pain, diarrhea, bleeding, constipation Skin: negative for rash and pruritus Heme: negative for easy bruising and gum/nose bleeding MS: negative for myalgias, arthralgias, back pain and muscle weakness Neurolo:negative for headaches, dizziness, vertigo, memory problems  Psych: negative for  feelings of anxiety, depression  Endocrine: negative for thyroid, diabetes Allergy/Immunology- negative for any medication or food allergies ? Pertinent Positives include : Objective:  VITALS:  BP 126/83 (BP Location: Left Arm, Patient Position: Sitting, Cuff Size: Normal)   Pulse 61   Temp 97.8 F (36.6 C) (Oral)   Ht 5\' 9"  (1.753 m)   Wt 157 lb (71.2 kg)   BMI 23.18 kg/m  PHYSICAL EXAM:  General: Alert, cooperative, no distress, appears stated age.  Head: Normocephalic, without obvious abnormality, atraumatic. Eyes: Conjunctivae clear, anicteric sclerae. Pupils are equal ENT Nares normal. No drainage or sinus tenderness. Lips, mucosa, and tongue normal. No Thrush Neck: Supple, symmetrical, no adenopathy, thyroid: non tender no carotid bruit and no JVD. Back: No CVA tenderness. Lungs: b/l air entry-  Heart: Regular rate and rhythm, no murmur, rub or gallop. Abdomen: Soft, non-tender,not distended. Bowel sounds normal. No masses Extremities: atraumatic, no cyanosis. No edema. No clubbing Skin: No rashes or lesions. Or bruising Lymph: Cervical, supraclavicular normal. Neurologic: Grossly non-focal Pertinent Labs Lab Results CBC    Component Value Date/Time   WBC 4.1 07/25/2018 0255   RBC 5.13 07/25/2018 0255   HGB 16.9 07/25/2018 0255   HCT 50.5 07/25/2018 0255   PLT 163 07/25/2018 0255   MCV 98.4 07/25/2018 0255   MCH 32.9 07/25/2018 0255   MCHC 33.5 07/25/2018 0255   RDW 14.1 07/25/2018 0255   LYMPHSABS 0.4 (L) 07/25/2018 0255   MONOABS 0.1 07/25/2018 0255   EOSABS 0.0 07/25/2018 0255   BASOSABS 0.0 07/25/2018 0255    CMP Latest Ref Rng & Units 07/27/2018 07/26/2018 07/25/2018  Glucose 70 - 99 mg/dL 164(H) 196(H) 68(L)  BUN 6 - 20 mg/dL 14 17 21(H)  Creatinine 0.61 - 1.24 mg/dL 0.66 0.73 0.97  Sodium 135 - 145 mmol/L 136 133(L) 133(L)  Potassium 3.5 - 5.1 mmol/L 3.6 3.7 4.2  Chloride 98 - 111 mmol/L 107 103 99  CO2 22 - 32 mmol/L 22 24 20(L)  Calcium 8.9 -  10.3 mg/dL 7.2(L) 6.5(L) 6.8(L)  Total Protein 6.5 - 8.1 g/dL - - 6.8  Total Bilirubin 0.3 - 1.2 mg/dL - - 1.5(H)  Alkaline Phos 38 - 126 U/L - - 18(L)  AST 15 - 41 U/L - - 45(H)  ALT 0 - 44 U/L - - 23      Microbiology: Recent Results (from the past 240 hour(s))  Novel Coronavirus, NAA (hospital order; send-out to ref lab)     Status: None   Collection Time: 07/24/18 11:28 AM  Result Value Ref Range Status   SARS-CoV-2, NAA Not Detected Not Detected Final    Comment: (NOTE) Testing was performed using  the cobas(R) SARS-CoV-2 test. This test was developed and its performance characteristics determined by [employer]. This test has not been FDA cleared or approved. This test has been authorized by FDA under an Emergency Use Authorization (EUA). This test is only authorized for the duration of time the declaration that circumstances exist justifying the authorization of the emergency use of in vitro diagnostic tests for detection of SARS-CoV-2 virus and/or diagnosis of COVID-19 infection under section 564(b)(1) of the Act, 21 U.S.C. 720NOB-0(J)(6), unless the authorization is terminated or revoked sooner. Performed At: [facility] [place], [place] [id number] Rush Farmer MD [id number]   Acid Fast Smear (AFB)     Status: None   Collection Time: 07/25/18 12:23 AM  Result Value Ref Range Status   AFB Specimen Processing Concentration  Final   Acid Fast Smear Negative  Final    Comment: (NOTE) Performed At: [facility] [address] [place], [place] [id number] Rush Farmer MD [id number]    Source (AFB) EXPECTORATED SPUTUM  Corrected    Comment: Performed at [facility], [address] [place], [place] [address] CORRECTED ON 03/15 AT 0537: PREVIOUSLY REPORTED AS 1   Acid Fast Smear (AFB)     Status: None   Collection Time: 07/25/18 10:33 AM  Result Value Ref Range Status   AFB Specimen Processing Concentration   Final    Comment: (NOTE) Performed At: [facility] [place], [place] [id number] Rush Farmer MD [id number]    Acid Fast Smear NOSMR  Final    Comment: (NOTE) The acid-fast bacilli (AFB) smear will not be performed due to the specimen source (blood) or submission of an insufficient quantity of specimen.    Source (AFB) SPUTUM  Corrected    Comment: Performed at [facility], [address] [place], [place] [address] CORRECTED ON 03/18 AT 1205: PREVIOUSLY REPORTED AS 1   MRSA PCR Screening     Status: None   Collection Time: 07/25/18  5:36 PM  Result Value Ref Range Status   MRSA by PCR NEGATIVE NEGATIVE Final    Comment:        The GeneXpert MRSA Assay (FDA approved for NASAL specimens only), is one component of a comprehensive MRSA colonization surveillance program. It is not intended to diagnose MRSA infection nor to guide or monitor treatment for MRSA infections. Performed at [facility], [place], [place] [address]   Acid Fast Smear (AFB)     Status: None   Collection Time: 07/25/18  9:51 PM  Result Value Ref Range Status   AFB Specimen Processing Concentration  Final   Acid Fast Smear Negative  Final    Comment: (NOTE) Performed At: [facility] [address] [place], [place] [id number] Rush Farmer MD [id number]    Source (AFB) EXPECTORATED SPUTUM  Final    Comment: Performed at [facility], [address] [place], [place] [address]  Expectorated sputum assessment w rflx to resp cult     Status: None   Collection Time: 07/25/18  9:51 PM  Result Value Ref Range Status   Specimen Description EXPECTORATED SPUTUM  Final   Special Requests NONE  Final   Sputum evaluation   Final    THIS SPECIMEN IS ACCEPTABLE FOR SPUTUM CULTURE Performed at [facility], [address] [place], [place] [address]    Report Status 07/25/2018 FINAL  Final  Culture, respiratory      Status: None  Collection Time: 07/25/18  9:51 PM  Result Value Ref Range Status   Specimen Description   Final    EXPECTORATED SPUTUM Performed at [facility], [address] [place], [place] [address]    Special Requests   Final    NONE Reflexed from [id number] Performed at [facility], [address] [place], [place] [address]    Gram Stain   Final    MODERATE WBC PRESENT,BOTH PMN AND MONONUCLEAR RARE SQUAMOUS EPITHELIAL CELLS PRESENT NO ORGANISMS SEEN    Culture   Final    RARE Consistent with normal respiratory flora. Performed at [facility], [place] [address], [place], [place] [address]    Report Status 07/28/2018 FINAL  Final  Acid Fast Smear (AFB)     Status: None (Preliminary result)   Collection Time: 07/26/18  6:19 PM  Result Value Ref Range Status   AFB Specimen Processing Concentration  Final   Acid Fast Smear Negative  Final    Comment: (NOTE) Performed At: [facility] [place], [place] [id number] Rush Farmer MD VH:[id number]    Source (AFB) PENDING  Incomplete   Impression/Recommendation ? B/l pneumonia-AFB neg/COVID 19 neg On levaquin and completing on 3/27 Underlying smoker/ likely COPD He needs to get a PCP HE needs to get prevnar/pneumovac Repeat CXR in 4 week ? ? ___________________________________________________ Discussed with patient

## 2018-09-08 LAB — ACID FAST CULTURE WITH REFLEXED SENSITIVITIES (MYCOBACTERIA)
Acid Fast Culture: NEGATIVE
Acid Fast Culture: NEGATIVE

## 2018-09-13 LAB — ACID FAST CULTURE WITH REFLEXED SENSITIVITIES (MYCOBACTERIA): Acid Fast Culture: NEGATIVE

## 2018-09-14 LAB — ACID FAST CULTURE WITH REFLEXED SENSITIVITIES (MYCOBACTERIA): Acid Fast Culture: NEGATIVE

## 2018-09-14 LAB — ACID FAST SMEAR (AFB, MYCOBACTERIA): Acid Fast Smear: NEGATIVE

## 2018-10-12 LAB — ACID FAST SMEAR (AFB, MYCOBACTERIA): Acid Fast Smear: NEGATIVE

## 2019-01-01 ENCOUNTER — Encounter: Payer: Self-pay | Admitting: Emergency Medicine

## 2019-01-01 ENCOUNTER — Emergency Department
Admission: EM | Admit: 2019-01-01 | Discharge: 2019-01-01 | Disposition: A | Discharge status: Home / Self Care | Payer: No Typology Code available for payment source | Attending: Emergency Medicine | Admitting: Emergency Medicine

## 2019-01-01 ENCOUNTER — Emergency Department: Payer: No Typology Code available for payment source

## 2019-01-01 ENCOUNTER — Other Ambulatory Visit: Payer: Self-pay

## 2019-01-01 DIAGNOSIS — M79652 Pain in left thigh: Secondary | ICD-10-CM | POA: Insufficient documentation

## 2019-01-01 DIAGNOSIS — S299XXA Unspecified injury of thorax, initial encounter: Secondary | ICD-10-CM | POA: Diagnosis present

## 2019-01-01 DIAGNOSIS — Y93I9 Activity, other involving external motion: Secondary | ICD-10-CM | POA: Insufficient documentation

## 2019-01-01 DIAGNOSIS — Y908 Blood alcohol level of 240 mg/100 ml or more: Secondary | ICD-10-CM | POA: Insufficient documentation

## 2019-01-01 DIAGNOSIS — F17210 Nicotine dependence, cigarettes, uncomplicated: Secondary | ICD-10-CM | POA: Insufficient documentation

## 2019-01-01 DIAGNOSIS — F10929 Alcohol use, unspecified with intoxication, unspecified: Secondary | ICD-10-CM | POA: Diagnosis not present

## 2019-01-01 DIAGNOSIS — Y92410 Unspecified street and highway as the place of occurrence of the external cause: Secondary | ICD-10-CM | POA: Insufficient documentation

## 2019-01-01 DIAGNOSIS — S2231XA Fracture of one rib, right side, initial encounter for closed fracture: Secondary | ICD-10-CM | POA: Diagnosis not present

## 2019-01-01 DIAGNOSIS — F10920 Alcohol use, unspecified with intoxication, uncomplicated: Secondary | ICD-10-CM

## 2019-01-01 DIAGNOSIS — S3981XA Other specified injuries of abdomen, initial encounter: Secondary | ICD-10-CM | POA: Diagnosis not present

## 2019-01-01 DIAGNOSIS — Y999 Unspecified external cause status: Secondary | ICD-10-CM | POA: Insufficient documentation

## 2019-01-01 DIAGNOSIS — S2232XA Fracture of one rib, left side, initial encounter for closed fracture: Secondary | ICD-10-CM

## 2019-01-01 DIAGNOSIS — R52 Pain, unspecified: Secondary | ICD-10-CM

## 2019-01-01 LAB — COMPREHENSIVE METABOLIC PANEL
ALT: 60 U/L — ABNORMAL HIGH (ref 0–44)
AST: 190 U/L — ABNORMAL HIGH (ref 15–41)
Albumin: 3.9 g/dL (ref 3.5–5.0)
Alkaline Phosphatase: 47 U/L (ref 38–126)
Anion gap: 16 — ABNORMAL HIGH (ref 5–15)
BUN: 12 mg/dL (ref 6–20)
CO2: 22 mmol/L (ref 22–32)
Calcium: 8.8 mg/dL — ABNORMAL LOW (ref 8.9–10.3)
Chloride: 103 mmol/L (ref 98–111)
Creatinine, Ser: 0.77 mg/dL (ref 0.61–1.24)
GFR calc Af Amer: >60 mL/min (ref >60)
GFR calc non Af Amer: >60 mL/min (ref >60)
Glucose, Bld: 82 mg/dL (ref 70–99)
Potassium: 3.3 mmol/L — ABNORMAL LOW (ref 3.5–5.1)
Sodium: 141 mmol/L (ref 135–145)
Total Bilirubin: 0.9 mg/dL (ref 0.3–1.2)
Total Protein: 7.6 g/dL (ref 6.5–8.1)

## 2019-01-01 LAB — CBC WITH DIFFERENTIAL/PLATELET
Abs Immature Granulocytes: 0.05 10*3/uL (ref 0.00–0.07)
Basophils Absolute: 0.1 10*3/uL (ref 0.0–0.1)
Basophils Relative: 1 %
Eosinophils Absolute: 0.1 10*3/uL (ref 0.0–0.5)
Eosinophils Relative: 2 %
HCT: 44 % (ref 39.0–52.0)
Hemoglobin: 15 g/dL (ref 13.0–17.0)
Immature Granulocytes: 1 %
Lymphocytes Relative: 32 %
Lymphs Abs: 2.3 10*3/uL (ref 0.7–4.0)
MCH: 33.9 pg (ref 26.0–34.0)
MCHC: 34.1 g/dL (ref 30.0–36.0)
MCV: 99.5 fL (ref 80.0–100.0)
Monocytes Absolute: 0.5 10*3/uL (ref 0.1–1.0)
Monocytes Relative: 8 %
Neutro Abs: 4.1 10*3/uL (ref 1.7–7.7)
Neutrophils Relative %: 56 %
Platelets: 153 10*3/uL (ref 150–400)
RBC: 4.42 MIL/uL (ref 4.22–5.81)
RDW: 16.4 % — ABNORMAL HIGH (ref 11.5–15.5)
WBC: 7.1 10*3/uL (ref 4.0–10.5)
nRBC: 0 % (ref 0.0–0.2)

## 2019-01-01 LAB — URINALYSIS, COMPLETE (UACMP) WITH MICROSCOPIC
Bacteria, UA: NONE SEEN
Bilirubin Urine: NEGATIVE
Glucose, UA: NEGATIVE mg/dL
Ketones, ur: NEGATIVE mg/dL
Leukocytes,Ua: NEGATIVE
Nitrite: NEGATIVE
Protein, ur: 30 mg/dL — AB
Specific Gravity, Urine: 1.014 (ref 1.005–1.030)
pH: 6 (ref 5.0–8.0)

## 2019-01-01 LAB — ETHANOL: Alcohol, Ethyl (B): 284 mg/dL — ABNORMAL HIGH (ref <10)

## 2019-01-01 LAB — LIPASE, BLOOD: Lipase: 93 U/L — ABNORMAL HIGH (ref 11–51)

## 2019-01-01 MED ORDER — IBUPROFEN 600 MG PO TABS: 600.0000 mg | ORAL_TABLET | Freq: Three times a day (TID) | ORAL | 0 refills | Status: DC | PRN

## 2019-01-01 MED ORDER — IOHEXOL 300 MG/ML  SOLN
100.0000 mL | Freq: Once | INTRAMUSCULAR | Status: AC | PRN
  Administered 2019-01-01: 100 mL via INTRAVENOUS

## 2019-01-01 MED ORDER — LIDOCAINE 5 % EX PTCH: 1.0000 | MEDICATED_PATCH | Freq: Two times a day (BID) | CUTANEOUS | 0 refills | Status: DC
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
## 2019-01-01 NOTE — ED Notes (Signed)
Pt ambulated to restroom for urine sample.

## 2019-01-01 NOTE — ED Notes (Signed)
Patient transported to CT 

## 2019-01-01 NOTE — Discharge Instructions (Signed)
Make sure you continue moving around and taking deep breaths despite your chest pain, to prevent pneumonia  See one of the alcohol abuse centers attached
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
## 2019-01-01 NOTE — ED Notes (Signed)
Patient transported to X-ray 

## 2019-04-09 ENCOUNTER — Emergency Department: Payer: Self-pay

## 2019-04-09 ENCOUNTER — Other Ambulatory Visit: Payer: Self-pay

## 2019-04-09 ENCOUNTER — Encounter: Payer: Self-pay | Admitting: Emergency Medicine

## 2019-04-09 ENCOUNTER — Inpatient Hospital Stay
Admission: EM | Admit: 2019-04-09 | Discharge: 2019-04-10 | DRG: 202 | Disposition: A | Discharge status: Home / Self Care | Payer: Self-pay | Attending: Family Medicine | Admitting: Family Medicine

## 2019-04-09 DIAGNOSIS — J45901 Unspecified asthma with (acute) exacerbation: Secondary | ICD-10-CM

## 2019-04-09 DIAGNOSIS — M1990 Unspecified osteoarthritis, unspecified site: Secondary | ICD-10-CM | POA: Diagnosis present

## 2019-04-09 DIAGNOSIS — Z20828 Contact with and (suspected) exposure to other viral communicable diseases: Secondary | ICD-10-CM | POA: Diagnosis present

## 2019-04-09 DIAGNOSIS — Y92239 Unspecified place in hospital as the place of occurrence of the external cause: Secondary | ICD-10-CM | POA: Diagnosis not present

## 2019-04-09 DIAGNOSIS — F17210 Nicotine dependence, cigarettes, uncomplicated: Secondary | ICD-10-CM | POA: Diagnosis present

## 2019-04-09 DIAGNOSIS — J441 Chronic obstructive pulmonary disease with (acute) exacerbation: Secondary | ICD-10-CM

## 2019-04-09 DIAGNOSIS — J9601 Acute respiratory failure with hypoxia: Secondary | ICD-10-CM

## 2019-04-09 DIAGNOSIS — Z791 Long term (current) use of non-steroidal anti-inflammatories (NSAID): Secondary | ICD-10-CM

## 2019-04-09 DIAGNOSIS — R0902 Hypoxemia: Secondary | ICD-10-CM

## 2019-04-09 DIAGNOSIS — Z79899 Other long term (current) drug therapy: Secondary | ICD-10-CM

## 2019-04-09 DIAGNOSIS — I10 Essential (primary) hypertension: Secondary | ICD-10-CM | POA: Diagnosis present

## 2019-04-09 DIAGNOSIS — G629 Polyneuropathy, unspecified: Secondary | ICD-10-CM

## 2019-04-09 DIAGNOSIS — T380X5A Adverse effect of glucocorticoids and synthetic analogues, initial encounter: Secondary | ICD-10-CM | POA: Diagnosis not present

## 2019-04-09 DIAGNOSIS — D72829 Elevated white blood cell count, unspecified: Secondary | ICD-10-CM | POA: Diagnosis not present

## 2019-04-09 DIAGNOSIS — I252 Old myocardial infarction: Secondary | ICD-10-CM

## 2019-04-09 DIAGNOSIS — R062 Wheezing: Secondary | ICD-10-CM

## 2019-04-09 HISTORY — DX: Unspecified asthma with (acute) exacerbation: J45.901

## 2019-04-09 LAB — COMPREHENSIVE METABOLIC PANEL
ALT: 31 U/L (ref 0–44)
AST: 34 U/L (ref 15–41)
Albumin: 4.6 g/dL (ref 3.5–5.0)
Alkaline Phosphatase: 59 U/L (ref 38–126)
Anion gap: 13 (ref 5–15)
BUN: 6 mg/dL (ref 6–20)
CO2: 27 mmol/L (ref 22–32)
Calcium: 9.6 mg/dL (ref 8.9–10.3)
Chloride: 99 mmol/L (ref 98–111)
Creatinine, Ser: 0.72 mg/dL (ref 0.61–1.24)
GFR calc Af Amer: >60 mL/min (ref >60)
GFR calc non Af Amer: >60 mL/min (ref >60)
Glucose, Bld: 108 mg/dL — ABNORMAL HIGH (ref 70–99)
Potassium: 4.6 mmol/L (ref 3.5–5.1)
Sodium: 139 mmol/L (ref 135–145)
Total Bilirubin: 1 mg/dL (ref 0.3–1.2)
Total Protein: 9.2 g/dL — ABNORMAL HIGH (ref 6.5–8.1)

## 2019-04-09 LAB — CREATININE, SERUM
Creatinine, Ser: 0.81 mg/dL (ref 0.61–1.24)
GFR calc Af Amer: >60 mL/min (ref >60)
GFR calc non Af Amer: >60 mL/min (ref >60)

## 2019-04-09 LAB — CBC WITH DIFFERENTIAL/PLATELET
Abs Immature Granulocytes: 0.03 10*3/uL (ref 0.00–0.07)
Basophils Absolute: 0.1 10*3/uL (ref 0.0–0.1)
Basophils Relative: 1 %
Eosinophils Absolute: 0.5 10*3/uL (ref 0.0–0.5)
Eosinophils Relative: 5 %
HCT: 48.6 % (ref 39.0–52.0)
Hemoglobin: 16.5 g/dL (ref 13.0–17.0)
Immature Granulocytes: 0 %
Lymphocytes Relative: 35 %
Lymphs Abs: 3.5 10*3/uL (ref 0.7–4.0)
MCH: 32.1 pg (ref 26.0–34.0)
MCHC: 34 g/dL (ref 30.0–36.0)
MCV: 94.6 fL (ref 80.0–100.0)
Monocytes Absolute: 0.8 10*3/uL (ref 0.1–1.0)
Monocytes Relative: 8 %
Neutro Abs: 5.1 10*3/uL (ref 1.7–7.7)
Neutrophils Relative %: 51 %
Platelets: 180 10*3/uL (ref 150–400)
RBC: 5.14 MIL/uL (ref 4.22–5.81)
RDW: 12.5 % (ref 11.5–15.5)
WBC: 10.1 10*3/uL (ref 4.0–10.5)
nRBC: 0 % (ref 0.0–0.2)

## 2019-04-09 LAB — HEMOGLOBIN A1C
Hgb A1c MFr Bld: 6 % — ABNORMAL HIGH (ref 4.8–5.6)
Mean Plasma Glucose: 125.5 mg/dL

## 2019-04-09 LAB — GLUCOSE, CAPILLARY
Glucose-Capillary: 137 mg/dL — ABNORMAL HIGH (ref 70–99)
Glucose-Capillary: 125 mg/dL — ABNORMAL HIGH (ref 70–99)

## 2019-04-09 LAB — TROPONIN I (HIGH SENSITIVITY): Troponin I (High Sensitivity): 6 ng/L (ref <18)

## 2019-04-09 LAB — SARS CORONAVIRUS 2 (TAT 6-24 HRS): SARS Coronavirus 2: NEGATIVE

## 2019-04-09 LAB — BRAIN NATRIURETIC PEPTIDE: B Natriuretic Peptide: 59 pg/mL (ref 0.0–100.0)

## 2019-04-09 MED ORDER — ACETAMINOPHEN 650 MG RE SUPP: 650.0000 mg | Freq: Four times a day (QID) | RECTAL | Status: DC | PRN

## 2019-04-09 MED ORDER — METHYLPREDNISOLONE SODIUM SUCC 40 MG IJ SOLR
40.0000 mg | Freq: Three times a day (TID) | INTRAMUSCULAR | Status: DC
  Administered 2019-04-09 – 2019-04-10 (×4): 40 mg via INTRAVENOUS
  Filled 2019-04-09 (×4): qty 1

## 2019-04-09 MED ORDER — ENOXAPARIN SODIUM 40 MG/0.4ML ~~LOC~~ SOLN: 40.0000 mg | SUBCUTANEOUS | Status: DC

## 2019-04-09 MED ORDER — BUDESONIDE 0.5 MG/2ML IN SUSP
0.5000 mg | Freq: Two times a day (BID) | RESPIRATORY_TRACT | Status: DC
  Administered 2019-04-09 – 2019-04-10 (×2): 0.5 mg via RESPIRATORY_TRACT
  Filled 2019-04-09 (×2): qty 2

## 2019-04-09 MED ORDER — ONDANSETRON HCL 4 MG PO TABS: 4.0000 mg | ORAL_TABLET | Freq: Four times a day (QID) | ORAL | Status: DC | PRN

## 2019-04-09 MED ORDER — INSULIN ASPART 100 UNIT/ML ~~LOC~~ SOLN: 0.0000 [IU] | Freq: Every day | SUBCUTANEOUS | Status: DC

## 2019-04-09 MED ORDER — ACETAMINOPHEN 325 MG PO TABS: 650.0000 mg | ORAL_TABLET | Freq: Four times a day (QID) | ORAL | Status: DC | PRN

## 2019-04-09 MED ORDER — ONDANSETRON HCL 4 MG/2ML IJ SOLN: 4.0000 mg | Freq: Four times a day (QID) | INTRAMUSCULAR | Status: DC | PRN

## 2019-04-09 MED ORDER — HYDRALAZINE HCL 20 MG/ML IJ SOLN
10.0000 mg | INTRAMUSCULAR | Status: DC | PRN
  Administered 2019-04-10: 10 mg via INTRAVENOUS
  Filled 2019-04-09: qty 1

## 2019-04-09 MED ORDER — IPRATROPIUM-ALBUTEROL 0.5-2.5 (3) MG/3ML IN SOLN
3.0000 mL | Freq: Four times a day (QID) | RESPIRATORY_TRACT | Status: DC | PRN
  Administered 2019-04-09 – 2019-04-10 (×3): 3 mL via RESPIRATORY_TRACT
  Filled 2019-04-09 (×3): qty 3

## 2019-04-09 MED ORDER — IPRATROPIUM-ALBUTEROL 0.5-2.5 (3) MG/3ML IN SOLN
3.0000 mL | Freq: Once | RESPIRATORY_TRACT | Status: AC
  Administered 2019-04-09: 3 mL via RESPIRATORY_TRACT
  Filled 2019-04-09: qty 3

## 2019-04-09 MED ORDER — INSULIN ASPART 100 UNIT/ML ~~LOC~~ SOLN
0.0000 [IU] | Freq: Three times a day (TID) | SUBCUTANEOUS | Status: DC
  Administered 2019-04-09 – 2019-04-10 (×2): 3 [IU] via SUBCUTANEOUS
  Filled 2019-04-09 (×2): qty 1

## 2019-04-09 MED ORDER — GABAPENTIN 100 MG PO CAPS: 100.0000 mg | ORAL_CAPSULE | Freq: Three times a day (TID) | ORAL | Status: DC

## 2019-04-09 MED ORDER — SENNOSIDES-DOCUSATE SODIUM 8.6-50 MG PO TABS: 1.0000 | ORAL_TABLET | Freq: Every evening | ORAL | Status: DC | PRN

## 2019-04-09 MED ORDER — IPRATROPIUM-ALBUTEROL 20-100 MCG/ACT IN AERS: 1.0000 | INHALATION_SPRAY | Freq: Four times a day (QID) | RESPIRATORY_TRACT | Status: DC

## 2019-04-09 MED ORDER — NICOTINE 21 MG/24HR TD PT24
21.0000 mg | MEDICATED_PATCH | Freq: Every day | TRANSDERMAL | Status: DC
  Administered 2019-04-09: 21 mg via TRANSDERMAL
  Filled 2019-04-09 (×2): qty 1

## 2019-04-09 MED ORDER — IPRATROPIUM-ALBUTEROL 20-100 MCG/ACT IN AERS: 1.0000 | INHALATION_SPRAY | Freq: Four times a day (QID) | RESPIRATORY_TRACT | Status: DC | PRN

## 2019-04-09 NOTE — ED Notes (Signed)
Transport to floor room 223.AS 

## 2019-04-09 NOTE — ED Provider Notes (Signed)
[facility] Emergency Department Provider Note   ____________________________________________    I have reviewed the triage vital signs and the nursing notes.   HISTORY  Chief Complaint Shortness of Breath     HPI Edward Macias is a 58 y.o. male with a history of asthma who does smoke cigarettes who presents with shortness of breath.  Patient reports it is gradually been getting worse over about 2 weeks, he states he has been using an inhaler that he had from an admission back in March which has helped somewhat.  Denies fevers or chills.  No loss of smell or taste.  Mild cough.  His primary complaint is worsening shortness of breath which worsened last night and made it hard for him to sleep.  No chest pain.  No nausea or vomiting  Past Medical History:  Diagnosis Date  . Arthritis   . Asthma    AS A CHILD-NO INHALERS    Patient Active Problem List   Diagnosis Date Noted  . Sepsis ([place]) 07/24/2018    Past Surgical History:  Procedure Laterality Date  . INGUINAL HERNIA REPAIR Left 10/03/2016   Procedure: HERNIA REPAIR INGUINAL ADULT;  Surgeon: Leonie Green, MD;  Location: [facility] ORS;  Service: General;  Laterality: Left;  . NO PAST SURGERIES      Prior to Admission medications   Medication Sig Start Date End Date Taking? Authorizing Provider  feeding supplement, ENSURE ENLIVE, (ENSURE ENLIVE) LIQD Take 237 mLs by mouth 2 (two) times daily between meals. Patient not taking: Reported on 08/03/2018 07/27/18   Bettey Costa, MD  gabapentin (NEURONTIN) 100 MG capsule Take 100 mg by mouth 3 (three) times daily. 07/01/18   [provider]  ibuprofen (ADVIL) 600 MG tablet Take 1 tablet (600 mg total) by mouth every 8 (eight) hours as needed (chest wall pain). 01/01/19   Duffy Bruce, MD  lidocaine (LIDODERM) 5 % Place 1 patch onto the skin every 12 (twelve) hours. Remove & Discard patch within 12 hours or as directed by MD 01/01/19 01/01/20   Duffy Bruce, MD  loratadine (CLARITIN) 10 MG tablet Take 10 mg by mouth daily as needed for allergies or rhinitis.    [provider]  metoprolol tartrate (LOPRESSOR) 25 MG tablet Take 1 tablet (25 mg total) by mouth 2 (two) times daily. 07/27/18   Bettey Costa, MD  nicotine (NICODERM CQ - DOSED IN MG/24 HOURS) 21 mg/24hr patch Place 1 patch (21 mg total) onto the skin daily. Patient not taking: Reported on 08/03/2018 07/28/18   Bettey Costa, MD     Allergies Patient has no known allergies.  History reviewed. No pertinent family history.  Social History Social History   Tobacco Use  . Smoking status: Current Every Day Smoker    Packs/day: 1.00    Years: 30.00    Pack years: 30.00    Types: Cigarettes  . Smokeless tobacco: Never Used  Substance Use Topics  . Alcohol use: Yes    Comment: pint per day  . Drug use: No    Review of Systems  Constitutional: As above Eyes: No visual changes.  ENT: No loss of smell or taste Cardiovascular: Denies chest pain. Respiratory: As above Gastrointestinal: No abdominal pain.  Genitourinary: Negative for dysuria. Musculoskeletal: No myalgias Skin: Negative for rash. Neurological: Negative for headaches    ____________________________________________   PHYSICAL EXAM:  VITAL SIGNS: ED Triage Vitals  Enc Vitals Group     BP 04/09/19 0856 Marland Kitchen)  190/115     Pulse Rate 04/09/19 0856 (!) 104     Resp 04/09/19 0856 (!) 34     Temp 04/09/19 0856 (!) 97.5 F (36.4 C)     Temp Source 04/09/19 0856 Axillary     SpO2 04/09/19 0856 94 %     Weight 04/09/19 0857 65.8 kg (145 lb)     Height 04/09/19 0857 1.626 m (5\' 4" )     Head Circumference --      Peak Flow --      Pain Score 04/09/19 0857 0     Pain Loc --      Pain Edu? --      Excl. in [place]? --     Constitutional: Alert and oriented.   Nose: No congestion/rhinnorhea. Mouth/Throat: Mucous membranes are moist.    Cardiovascular: Tachycardia, regular rhythm. Grossly  normal heart sounds.  Good peripheral circulation. Respiratory:  increased respiratory effort with tachypnea, mild wheezing diffusely Gastrointestinal: Soft and nontender. No distention.   Musculoskeletal: No lower extremity tenderness nor edema.  Warm and well perfused Neurologic:  Normal speech and language. No gross focal neurologic deficits are appreciated.  Skin:  Skin is warm, dry and intact. No rash noted. Psychiatric: Mood and affect are normal. Speech and behavior are normal.  ____________________________________________   LABS (all labs ordered are listed, but only abnormal results are displayed)  Labs Reviewed  COMPREHENSIVE METABOLIC PANEL - Abnormal; Notable for the following components:      Result Value   Glucose, Bld 108 (*)    Total Protein 9.2 (*)    All other components within normal limits  SARS CORONAVIRUS 2 (TAT 6-24 HRS)  CBC WITH DIFFERENTIAL/PLATELET  BRAIN NATRIURETIC PEPTIDE  TROPONIN I (HIGH SENSITIVITY)   ____________________________________________  EKG  ED ECG REPORT I, Lavonia Drafts, the attending physician, personally viewed and interpreted this ECG.  Date: 04/09/2019  Rhythm: normal sinus rhythm QRS Axis: normal Intervals: normal ST/T Wave abnormalities: normal Narrative Interpretation: no evidence of acute ischemia  ____________________________________________  RADIOLOGY  Chest x-ray unremarkable ____________________________________________   PROCEDURES  Procedure(s) performed: No  Procedures   Critical Care performed:No ____________________________________________   INITIAL IMPRESSION / ASSESSMENT AND PLAN / ED COURSE  Pertinent labs & imaging results that were available during my care of the patient were reviewed by me and considered in my medical decision making (see chart for details).  Patient presents with shortness of breath, suspicious for asthma/COPD exacerbation given wheezing, afebrile no myalgias making it  less likely novel coronavirus however still possible.  Pending chest x-ray, labs  Chest x-ray is overall reassuring, no pneumonia.  Will treat with DuoNeb's for presumed COPD exacerbation.  Patient with improvement in wheezing however when we took off his 2 L nasal cannula he did desaturate into the 86 to 88% range.  Given that he is still requiring oxygen he will need admission, will discuss with hospitalist    ____________________________________________   FINAL CLINICAL IMPRESSION(S) / ED DIAGNOSES  Final diagnoses:  COPD exacerbation ([place])        Note:  This document was prepared using Dragon voice recognition software and may include unintentional dictation errors.   Lavonia Drafts, MD 04/09/19 1220

## 2019-04-09 NOTE — ED Triage Notes (Signed)
Pt arrival via [facility] from home due to shob. Pt called out this morning because he was feeling short of breath and was unable to get much sleep last night. Pt has a hx of asthma, but has not have issues since March. Pt is a smoker.   Pts RA sats 78% with EMS, two duonebs and 125 solumedrol given in route. O2 100% on duonebs with EMS.

## 2019-04-09 NOTE — ED Notes (Signed)
Pt transitioned from 2 L [place] to RA per Dr. Corky Downs. Pt sats dropped to 85%, placed by on  and Dr. Corky Downs notified.

## 2019-04-09 NOTE — H&P (Signed)
History and Physical    Edward Macias [id number] DOB: July 28, 1960 DOA: 04/09/2019  PCP: Patient, No Pcp Per Patient coming from: Home  Chief Complaint: Shortness of breath  HPI: Edward Macias is a 58 y.o. male with medical history significant of history of asthma versus COPD, tobacco use comes to the hospital due to complaints of shortness of breath.  Ongoing shortness of breath for couple of weeks but worsened last night therefore was unable to sleep.  This morning had called out because of his shortness of breath.  Eventually called EMS. EMS noted his oxygen saturation were 78% on room air therefore administered 2 duo nebs and IV Solu-Medrol.  Placed on supplemental oxygen and was saturating 100% .  In the ER he received bronchodilator treatment and felt better but after taking him off oxygen his saturation dropped back to 85%.  Decided to admit him for further care.  Was admitted for pneumonia back in March.  Typically smokes anywhere from 20-30 cigarettes daily, drinks alcohol socially   Review of Systems: As per HPI otherwise 10 point review of systems negative.  Review of Systems Otherwise negative except as per HPI, including: General: Denies fever, chills, night sweats or unintended weight loss. Resp: Denies cough Cardiac: Denies chest pain, palpitations, orthopnea, paroxysmal nocturnal dyspnea. GI: Denies abdominal pain, nausea, vomiting, diarrhea or constipation GU: Denies dysuria, frequency, hesitancy or incontinence MS: Denies muscle aches, joint pain or swelling Neuro: Denies headache, neurologic deficits (focal weakness, numbness, tingling), abnormal gait Psych: Denies anxiety, depression, SI/HI/AVH Skin: Denies new rashes or lesions ID: Denies sick contacts, exotic exposures, travel  Past Medical History:  Diagnosis Date  . Arthritis   . Asthma    AS A CHILD-NO INHALERS    Past Surgical History:  Procedure Laterality Date  . INGUINAL HERNIA REPAIR Left  10/03/2016   Procedure: HERNIA REPAIR INGUINAL ADULT;  Surgeon: Leonie Green, MD;  Location: [facility] ORS;  Service: General;  Laterality: Left;  . NO PAST SURGERIES      SOCIAL HISTORY:  reports that he has been smoking cigarettes. He has a 30.00 pack-year smoking history. He has never used smokeless tobacco. He reports current alcohol use. He reports that he does not use drugs.  No Known Allergies  FAMILY HISTORY: History reviewed. No pertinent family history.   Prior to Admission medications   Medication Sig Start Date End Date Taking? Authorizing Provider  feeding supplement, ENSURE ENLIVE, (ENSURE ENLIVE) LIQD Take 237 mLs by mouth 2 (two) times daily between meals. Patient not taking: Reported on 08/03/2018 07/27/18   Edward Costa, MD  gabapentin (NEURONTIN) 100 MG capsule Take 100 mg by mouth 3 (three) times daily. 07/01/18   [provider]  ibuprofen (ADVIL) 600 MG tablet Take 1 tablet (600 mg total) by mouth every 8 (eight) hours as needed (chest wall pain). 01/01/19   Duffy Bruce, MD  lidocaine (LIDODERM) 5 % Place 1 patch onto the skin every 12 (twelve) hours. Remove & Discard patch within 12 hours or as directed by MD 01/01/19 01/01/20  Duffy Bruce, MD  loratadine (CLARITIN) 10 MG tablet Take 10 mg by mouth daily as needed for allergies or rhinitis.    [provider]  metoprolol tartrate (LOPRESSOR) 25 MG tablet Take 1 tablet (25 mg total) by mouth 2 (two) times daily. 07/27/18   Edward Costa, MD  nicotine (NICODERM CQ - DOSED IN MG/24 HOURS) 21 mg/24hr patch Place 1 patch (21 mg total) onto the skin daily. Patient not taking:  Reported on 08/03/2018 07/28/18   Edward Costa, MD    Physical Exam: Vitals:   04/09/19 1122 04/09/19 1123 04/09/19 1128 04/09/19 1130  BP:    (!) 151/97  Pulse: (!) 102 (!) 110 93 92  Resp: (!) 24 (!) 21 19 19   Temp:      TempSrc:      SpO2: (!) 85% (!) 86% 93% 94%  Weight:      Height:          Constitutional: NAD,  calm, comfortable Eyes: PERRL, lids and conjunctivae normal ENMT: Mucous membranes are moist. Posterior pharynx clear of any exudate or lesions.Normal dentition.  Neck: normal, supple, no masses, no thyromegaly Respiratory: Bilateral inspiratory and expiratory wheezing diffusely Cardiovascular: Regular rate and rhythm, no murmurs / rubs / gallops. No extremity edema. 2+ pedal pulses. No carotid bruits.  Abdomen: no tenderness, no masses palpated. No hepatosplenomegaly. Bowel sounds positive.  Musculoskeletal: no clubbing / cyanosis. No joint deformity upper and lower extremities. Good ROM, no contractures. Normal muscle tone.  Skin: no rashes, lesions, ulcers. No induration Neurologic: CN 2-12 grossly intact. Sensation intact, DTR normal. Strength 5/5 in all 4.  Psychiatric: Normal judgment and insight. Alert and oriented x 3. Normal mood.     Labs on Admission: I have personally reviewed following labs and imaging studies  CBC: Recent Labs  Lab 04/09/19 0903  WBC 10.1  NEUTROABS 5.1  HGB 16.5  HCT 48.6  MCV 94.6  PLT 99991111   Basic Metabolic Panel: Recent Labs  Lab 04/09/19 0903  NA 139  K 4.6  CL 99  CO2 27  GLUCOSE 108*  BUN 6  CREATININE 0.72  CALCIUM 9.6   GFR: Estimated Creatinine Clearance: 83.3 mL/min (by C-G formula based on SCr of 0.72 mg/dL). Liver Function Tests: Recent Labs  Lab 04/09/19 0903  AST 34  ALT 31  ALKPHOS 59  BILITOT 1.0  PROT 9.2*  ALBUMIN 4.6   No results for input(s): LIPASE, AMYLASE in the last 168 hours. No results for input(s): AMMONIA in the last 168 hours. Coagulation Profile: No results for input(s): INR, PROTIME in the last 168 hours. Cardiac Enzymes: No results for input(s): CKTOTAL, CKMB, CKMBINDEX, TROPONINI in the last 168 hours. BNP (last 3 results) No results for input(s): PROBNP in the last 8760 hours. HbA1C: No results for input(s): HGBA1C in the last 72 hours. CBG: No results for input(s): GLUCAP in the last 168  hours. Lipid Profile: No results for input(s): CHOL, HDL, LDLCALC, TRIG, CHOLHDL, LDLDIRECT in the last 72 hours. Thyroid Function Tests: No results for input(s): TSH, T4TOTAL, FREET4, T3FREE, THYROIDAB in the last 72 hours. Anemia Panel: No results for input(s): VITAMINB12, FOLATE, FERRITIN, TIBC, IRON, RETICCTPCT in the last 72 hours. Urine analysis:    Component Value Date/Time   COLORURINE YELLOW (A) 01/01/2019 1443   APPEARANCEUR HAZY (A) 01/01/2019 1443   LABSPEC 1.014 01/01/2019 1443   PHURINE 6.0 01/01/2019 1443   GLUCOSEU NEGATIVE 01/01/2019 1443   HGBUR MODERATE (A) 01/01/2019 1443   BILIRUBINUR NEGATIVE 01/01/2019 1443   KETONESUR NEGATIVE 01/01/2019 1443   PROTEINUR 30 (A) 01/01/2019 1443   NITRITE NEGATIVE 01/01/2019 1443   LEUKOCYTESUR NEGATIVE 01/01/2019 1443   Sepsis Labs: !!!!!!!!!!!!!!!!!!!!!!!!!!!!!!!!!!!!!!!!!!!! @LABRCNTIP (procalcitonin:4,lacticidven:4) )No results found for this or any previous visit (from the past 240 hour(s)).   Radiological Exams on Admission: Dg Chest Portable 1 View  Result Date: 04/09/2019 CLINICAL DATA:  Shortness of breath. EXAM: PORTABLE CHEST 1 VIEW COMPARISON:  Chest radiograph  and chest CT January 01, 2019 FINDINGS: No edema or consolidation. Heart size and pulmonary vascularity are normal. No adenopathy. Scattered metallic pellets are noted throughout the chest region. Old healed rib fractures on the left noted. IMPRESSION: No edema or consolidation.  Stable cardiac silhouette. Electronically Signed   By: Lowella Grip III M.D.   On: 04/09/2019 09:34     All images have been reviewed by me personally.    Assessment/Plan Principal Problem:   Acute respiratory failure with hypoxia (HCC) Active Problems:   Asthma exacerbation   Wheezing   Hypoxia    Acute hypoxic respiratory failure, 85% room air Acute moderate asthma versus COPD exacerbation Tobacco use -Admit patient to the hospital -Scheduled and as needed  bronchodilators (inhalers until covid 19 neg, then change be changed to nebs); Pulmicort, IV Solu-Medrol 40 mg every 8 hours -Incentive spirometer/flutter valve -Insulin sliding scale and Accu-Chek while on steroids -Active wheezing, holding home metoprolol -BNP-normal -Would benefit from outpatient PFTs once recovered from this -Nicotine patch  Essential hypertension -Beta-blocker on hold due to active wheezing  Peripheral neuropathy -Gabapentin 800 mg 3 times daily  DVT prophylaxis: Lovenox Code Status: Full code Family Communication: None Disposition Plan: To be determined Consults called: None Admission status: Admit inpatient for aggressive bronchodilator and IV steroid treatment.  Patient is hypoxic saturating 85% on room air requiring 2 L nasal cannula.  Bilateral diffuse wheezing   Time Spent: 65 minutes.  >50% of the time was devoted to discussing the patients care, assessment, plan and disposition with other care givers along with counseling the patient about the risks and benefits of treatment.     Arsenio Loader MD [facility]  If 7PM-7AM, please contact night-coverage   04/09/2019, 12:31 PM

## 2019-04-10 DIAGNOSIS — F17210 Nicotine dependence, cigarettes, uncomplicated: Secondary | ICD-10-CM

## 2019-04-10 DIAGNOSIS — G629 Polyneuropathy, unspecified: Secondary | ICD-10-CM

## 2019-04-10 DIAGNOSIS — G609 Hereditary and idiopathic neuropathy, unspecified: Secondary | ICD-10-CM

## 2019-04-10 DIAGNOSIS — J45901 Unspecified asthma with (acute) exacerbation: Principal | ICD-10-CM

## 2019-04-10 LAB — COMPREHENSIVE METABOLIC PANEL
ALT: 23 U/L (ref 0–44)
AST: 23 U/L (ref 15–41)
Albumin: 3.8 g/dL (ref 3.5–5.0)
Alkaline Phosphatase: 54 U/L (ref 38–126)
Anion gap: 11 (ref 5–15)
BUN: 14 mg/dL (ref 6–20)
CO2: 28 mmol/L (ref 22–32)
Calcium: 10.1 mg/dL (ref 8.9–10.3)
Chloride: 100 mmol/L (ref 98–111)
Creatinine, Ser: 0.86 mg/dL (ref 0.61–1.24)
GFR calc Af Amer: >60 mL/min (ref >60)
GFR calc non Af Amer: >60 mL/min (ref >60)
Glucose, Bld: 148 mg/dL — ABNORMAL HIGH (ref 70–99)
Potassium: 4.3 mmol/L (ref 3.5–5.1)
Sodium: 139 mmol/L (ref 135–145)
Total Bilirubin: 0.6 mg/dL (ref 0.3–1.2)
Total Protein: 7.9 g/dL (ref 6.5–8.1)

## 2019-04-10 LAB — CBC
HCT: 45.2 % (ref 39.0–52.0)
Hemoglobin: 15 g/dL (ref 13.0–17.0)
MCH: 32.4 pg (ref 26.0–34.0)
MCHC: 33.2 g/dL (ref 30.0–36.0)
MCV: 97.6 fL (ref 80.0–100.0)
Platelets: 238 10*3/uL (ref 150–400)
RBC: 4.63 MIL/uL (ref 4.22–5.81)
RDW: 12.4 % (ref 11.5–15.5)
WBC: 16.9 10*3/uL — ABNORMAL HIGH (ref 4.0–10.5)
nRBC: 0 % (ref 0.0–0.2)

## 2019-04-10 LAB — GLUCOSE, CAPILLARY
Glucose-Capillary: 117 mg/dL — ABNORMAL HIGH (ref 70–99)
Glucose-Capillary: 126 mg/dL — ABNORMAL HIGH (ref 70–99)

## 2019-04-10 LAB — MAGNESIUM: Magnesium: 1.5 mg/dL — ABNORMAL LOW (ref 1.7–2.4)

## 2019-04-10 MED ORDER — PREDNISONE 10 MG PO TABS: ORAL_TABLET | ORAL | 0 refills | Status: AC

## 2019-04-10 MED ORDER — ALBUTEROL SULFATE HFA 108 (90 BASE) MCG/ACT IN AERS: 2.0000 | INHALATION_SPRAY | Freq: Four times a day (QID) | RESPIRATORY_TRACT | 1 refills | Status: DC | PRN

## 2019-04-10 NOTE — Progress Notes (Signed)
PROGRESS NOTE  Edward Macias [id number] DOB: 1960/11/19 DOA: 04/09/2019 PCP: Patient, No Pcp Per  Brief History   58 year old man PMH asthma, 1-1 0.5 pack/day smoker, presented with acute on chronic shortness of breath.  Found to be hypoxic 78% on room air.  Treated with bronchodilators but remained hypoxic.  Admitted for acute hypoxic respiratory failure, acute asthma exacerbation  A & P  Acute hypoxic respiratory failure secondary to acute asthma exacerbation, complicated by ongoing daily cigarette smoking --Much improved.  Continue bronchodilators, inhaled steroids, Solu-Medrol --Consider outpatient PFTs and pulmonology evaluation --Wean oxygen as tolerated.  Tobacco use disorder, 1-1.5 pack/day smoker --Continue nicotine patch, Nicorette gum --Recommend cessation, he desires to quit  Peripheral neuropathy, idiopathic --Continue gabapentin  Overall reports significant improvement.  Will ambulate and test and see if we can wean off of oxygen.  If hypoxia resolved and he continues to improve, can probably discharge home this afternoon.  Chart review . PMH includes asthma . September 2019: Outpatient office visit for idiopathic peripheral neuropathyMarch 2020 hospitalization for acute hypoxic respiratory failure, multifocal pneumonia, sepsis, COPD exacerbation . March 2020 follow-up in ID clinic for bilateral pneumonia.  AFB and COVID-19 were negative.  Completed a course of Levaquin.  Resolved Hospital Problem list       DVT prophylaxis: early ambulation Code Status: Full Family Communication: none Disposition Plan: home    Murray Hodgkins, MD  [facility] Direct contact: see [email or web address] (further directions at bottom of note if needed) 7PM-7AM contact night coverage as at bottom of note 04/10/2019, 1:25 PM  LOS: 1 day   Significant Hospital Events   . 11/28 admitted for asthma exacerbation, acute hypoxic respiratory failure   Consults:  .    Procedures:   .   Significant Diagnostic Tests:  . 11/28 chest x-ray: No edema or consolidation.  Scattered metallic pellets throughout the chest and upper abdomen region. . 1/28 EKG independently reviewed: Sinus rhythm, prolonged PR, bilateral atrial enlargement, anterior MI, old, no significant changes compared to previous study 07/24/2018   Micro Data:   11/28 SARS-CoV-2 negative   Antimicrobials:  .   Interval History/Subjective  Feels a lot better today.  Breathing better.  No new issues.  Objective   Vitals:  Vitals:   04/10/19 0750 04/10/19 1145  BP:  (!) 163/91  Pulse:  90  Resp:  16  Temp:  98.3 F (36.8 C)  SpO2: 94% 93%    Exam:  Constitutional:  . Appears calm and comfortable Respiratory.  Clear to auscultation bilaterally, fair air movement.  No frank wheezes, rales or rhonchi.  Speaks in full sentences.  No significant increase in respiratory effort. Cardiovascular.  Regular rate and rhythm.  No murmur, rub or gallop.  No lower extremity edema. Psychiatric.  Grossly normal mood and affect.  Speech fluent and appropriate  I have personally reviewed the following:   Today's Data  . CBG stable . Patient metabolic panel unremarkable.  Magnesium 1.5.  LFTs unremarkable. . WBC up to 16.9.  Remainder CBC unremarkable.  Leukocytosis is most likely related to steroids and not significant.    Scheduled Meds: . budesonide (PULMICORT) nebulizer solution  0.5 mg Nebulization BID  . insulin aspart  0-20 Units Subcutaneous TID WC  . insulin aspart  0-5 Units Subcutaneous QHS  . methylPREDNISolone (SOLU-MEDROL) injection  40 mg Intravenous Q8H  . nicotine  21 mg Transdermal Daily   Continuous Infusions:  Principal Problem:   Acute respiratory failure with hypoxia (HCC) Active Problems:  Asthma exacerbation   Wheezing   Hypoxia   Cigarette smoker   Peripheral neuropathy   LOS: 1 day   How to contact the [facility] Attending or Consulting provider [place] or covering provider  during after hours [place], for this patient?  1. Check the care team in [facility] and look for a) attending/consulting [facility] provider listed and b) the [facility] team listed 2. Log into [email or web address] and use [place]'s universal password to access. If you do not have the password, please contact the hospital operator. 3. Locate the [facility] provider you are looking for under [facility] and page to a number that you can be directly reached. 4. If you still have difficulty reaching the provider, please page the [facility] (Director on Call) for the Hospitalists listed on amion for assistance.

## 2019-04-10 NOTE — Progress Notes (Signed)
Notified Dr. Sarajane Jews of BP 163/91. No new orders given at this time.

## 2019-04-10 NOTE — Progress Notes (Signed)
SATURATION QUALIFICATIONS: (This note is used to comply with regulatory documentation for home oxygen)  Patient Saturations on Room Air at Rest = 91%  Patient Saturations on Room Air while Ambulating = 89-93%   Please briefly explain why patient needs home oxygen: Patient does not qualify for home oxygen. HR 110-127 during walk notified Dr. Sarajane Jews.

## 2019-04-10 NOTE — Progress Notes (Signed)
Edward Macias and [name]. VSS. Pt tolerating diet well. No complaints of nausea or vomiting. IV removed intact, prescriptions given. Pt voices understanding of discharge instructions with no further questions. Patient discharged via wheelchair with RN  Allergies as of 04/10/2019   No Known Allergies     Medication List    TAKE these medications   albuterol 108 (90 Base) MCG/ACT inhaler Commonly known as: VENTOLIN HFA Inhale 2 puffs into the lungs every 6 (six) hours as needed for wheezing or shortness of breath.   loratadine 10 MG tablet Commonly known as: CLARITIN Take 10 mg by mouth daily as needed for allergies or rhinitis.   predniSONE 10 MG tablet Commonly known as: DELTASONE Take 4 tablets (40 mg total) by mouth daily for 3 days, THEN 2 tablets (20 mg total) daily for 3 days, THEN 1 tablet (10 mg total) daily for 3 days. Start taking on: April 10, 2019       Vitals:   04/10/19 0750 04/10/19 1145  BP:  (!) 163/91  Pulse:  90  Resp:  16  Temp:  98.3 F (36.8 C)  SpO2: 94% 93%    Edward Macias
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
## 2019-11-07 ENCOUNTER — Emergency Department: Payer: Self-pay

## 2019-11-07 ENCOUNTER — Other Ambulatory Visit: Payer: Self-pay

## 2019-11-07 ENCOUNTER — Emergency Department
Admission: EM | Admit: 2019-11-07 | Discharge: 2019-11-07 | Disposition: A | Discharge status: Home / Self Care | Payer: Self-pay | Attending: Emergency Medicine | Admitting: Emergency Medicine

## 2019-11-07 ENCOUNTER — Encounter: Payer: Self-pay | Admitting: Emergency Medicine

## 2019-11-07 DIAGNOSIS — Z7951 Long term (current) use of inhaled steroids: Secondary | ICD-10-CM | POA: Insufficient documentation

## 2019-11-07 DIAGNOSIS — J45909 Unspecified asthma, uncomplicated: Secondary | ICD-10-CM | POA: Insufficient documentation

## 2019-11-07 DIAGNOSIS — M25551 Pain in right hip: Secondary | ICD-10-CM | POA: Insufficient documentation

## 2019-11-07 DIAGNOSIS — F17210 Nicotine dependence, cigarettes, uncomplicated: Secondary | ICD-10-CM | POA: Insufficient documentation

## 2019-11-07 MED ORDER — TRAMADOL HCL 50 MG PO TABS
50.0000 mg | ORAL_TABLET | Freq: Once | ORAL | Status: AC
  Administered 2019-11-07: 50 mg via ORAL
  Filled 2019-11-07: qty 1

## 2019-11-07 MED ORDER — MELOXICAM 15 MG PO TABS: 15.0000 mg | ORAL_TABLET | Freq: Every day | ORAL | 0 refills | Status: DC

## 2019-11-07 MED ORDER — LIDOCAINE 5 % EX PTCH
1.0000 | MEDICATED_PATCH | CUTANEOUS | Status: DC
  Administered 2019-11-07: 1 via TRANSDERMAL
  Filled 2019-11-07: qty 1

## 2019-11-07 MED ORDER — IBUPROFEN 600 MG PO TABS
600.0000 mg | ORAL_TABLET | Freq: Once | ORAL | Status: AC
  Administered 2019-11-07: 600 mg via ORAL
  Filled 2019-11-07: qty 1

## 2019-11-07 NOTE — Discharge Instructions (Signed)
Follow discharge care instruction take medication as directed.  [facility]abl[facility] with [facility].
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
## 2019-11-07 NOTE — ED Triage Notes (Signed)
First nurse note- right lower back pain radiating down right leg.  Worst when sitting.  No loss bowel or bladder.

## 2019-11-07 NOTE — ED Triage Notes (Signed)
Presents with pain to right posterior hip area  And pain is moving into right leg  Denies recent injury  But states has had old injury  Ambulates with slight limp

## 2019-11-24 ENCOUNTER — Ambulatory Visit: Payer: Self-pay | Admitting: Gerontology

## 2019-11-24 ENCOUNTER — Encounter: Payer: Self-pay | Admitting: Gerontology

## 2019-11-24 VITALS — BP 106/71 | HR 83 | Ht 69.0 in | Wt 151.0 lb

## 2019-11-24 DIAGNOSIS — F17210 Nicotine dependence, cigarettes, uncomplicated: Secondary | ICD-10-CM

## 2019-11-24 DIAGNOSIS — G8929 Other chronic pain: Secondary | ICD-10-CM | POA: Insufficient documentation

## 2019-11-24 DIAGNOSIS — Z8709 Personal history of other diseases of the respiratory system: Secondary | ICD-10-CM | POA: Insufficient documentation

## 2019-11-24 DIAGNOSIS — Z7689 Persons encountering health services in other specified circumstances: Secondary | ICD-10-CM | POA: Insufficient documentation

## 2019-11-24 MED ORDER — MELOXICAM 15 MG PO TABS: 15.0000 mg | ORAL_TABLET | Freq: Every day | ORAL | 0 refills | Status: DC

## 2019-11-24 MED ORDER — ALBUTEROL SULFATE HFA 108 (90 BASE) MCG/ACT IN AERS: 2.0000 | INHALATION_SPRAY | Freq: Four times a day (QID) | RESPIRATORY_TRACT | 1 refills | Status: DC | PRN

## 2019-11-24 NOTE — Patient Instructions (Signed)

## 2019-11-24 NOTE — Progress Notes (Signed)
Patient ID: Edward Macias, male   DOB: November 29, 1960, 59 y.o.   MRN: [id number]  Chief Complaint  Patient presents with   Establish Care   COPD    states was diagnosed at a [place] in April 2021    HPI Edward Macias is a 59 y.o. male who presents to establish care and evaluation of his chronic conditions. He was seen at the ED on 11/07/2019 for right hip pain. Imaging to right hip, showed mild symmetric narrowing of each hip joint. No fracture or Dislocation per Dr Heide Spark.  He states that he continues to experience constant stabbing 10/10 pain that radiates to his ankle. He states that changing position from a lying or sitting to standing, wearing his pants and socks aggravates symptom. He states that taking Meloxicam offers no relief.. He has a history of Asthma and his last exercabation was 03/2019. He states that his breathing is stable, denies wheezing and chest tightness. He states that he continues to smoke 1 pack of cigarette daily and admits the desire to quit. Overall, he states that he's doing well and offers no further complaints.   Past Medical History:  Diagnosis Date   Arthritis    Asthma    AS A CHILD-NO INHALERS    Past Surgical History:  Procedure Laterality Date   INGUINAL HERNIA REPAIR Left 10/03/2016   Procedure: HERNIA REPAIR INGUINAL ADULT;  Surgeon: Leonie Green, MD;  Location: [facility] ORS;  Service: General;  Laterality: Left;   NO PAST SURGERIES      No family history on file.  Social History Social History   Tobacco Use   Smoking status: Current Every Day Smoker    Packs/day: 1.00    Years: 30.00    Pack years: 30.00    Types: Cigarettes   Smokeless tobacco: Never Used  Scientific laboratory technician Use: Never used  Substance Use Topics   Alcohol use: Yes    Comment: occasional   Drug use: No    No Known Allergies  Current Outpatient Medications  Medication Sig Dispense Refill   albuterol (VENTOLIN HFA) 108 (90 Base) MCG/ACT  inhaler Inhale 2 puffs into the lungs every 6 (six) hours as needed for wheezing or shortness of breath. 18 g 1   loratadine (CLARITIN) 10 MG tablet Take 10 mg by mouth daily as needed for allergies or rhinitis.     meloxicam (MOBIC) 15 MG tablet Take 1 tablet (15 mg total) by mouth daily. 30 tablet 0   No current facility-administered medications for this visit.    Review of Systems Review of Systems  Constitutional: Negative.   HENT: Negative.   Eyes: Negative.   Respiratory: Negative.   Cardiovascular: Negative.   Gastrointestinal: Negative.   Endocrine: Negative.   Genitourinary: Negative.   Musculoskeletal: Positive for arthralgias (right hip pain).  Skin: Negative.   Neurological: Negative.   Hematological: Negative.   Psychiatric/Behavioral: Negative.     Blood pressure 106/71, pulse 83, height '5\' 9"'  (1.753 m), weight 151 lb (68.5 kg), SpO2 94 %.  Physical Exam Physical Exam HENT:     Head: Normocephalic and atraumatic.     Nose: Nose normal.     Mouth/Throat:     Mouth: Mucous membranes are moist.  Eyes:     Extraocular Movements: Extraocular movements intact.     Pupils: Pupils are equal, round, and reactive to light.  Cardiovascular:     Rate and Rhythm: Normal rate and regular rhythm.  Pulses: Normal pulses.     Heart sounds: Normal heart sounds.  Pulmonary:     Effort: Pulmonary effort is normal.     Breath sounds: Normal breath sounds.  Abdominal:     General: Abdomen is flat. Bowel sounds are normal.     Palpations: Abdomen is soft.  Genitourinary:    Comments: Deferred per patient Musculoskeletal:        General: Normal range of motion.  Skin:    General: Skin is warm and dry.  Neurological:     General: No focal deficit present.     Mental Status: He is alert and oriented to person, place, and time. Mental status is at baseline.  Psychiatric:        Mood and Affect: Mood normal.        Behavior: Behavior normal.        Thought Content:  Thought content normal.        Judgment: Judgment normal.     Data Reviewed Labs and past medical history was reviewed.  Assessment and Plan   1. Encounter to establish care - Routine labs will be checked. - CBC w/Diff; Future - Lipid panel; Future - Comp Met (CMET); Future - HgB A1c; Future - Urinalysis; Future  2. Cigarette smoker - He was advised on smoking cessation , and was provided with [facility] Quit line information. - CT CHEST LUNG CA SCREEN LOW DOSE W/O CM; Future  3. History of asthma - His Asthma is under control and he will continue on current treatment regimen. - albuterol (VENTOLIN HFA) 108 (90 Base) MCG/ACT inhaler; Inhale 2 puffs into the lungs every 6 (six) hours as needed for wheezing or shortness of breath.  Dispense: 18 g; Refill: 1  4. Chronic right hip pain - He will continue on Meloxicam and will follow up with [facility] Orthopedic Surgeon Dr Vickki Hearing. - meloxicam (MOBIC) 15 MG tablet; Take 1 tablet (15 mg total) by mouth daily.  Dispense: 30 tablet; Refill: 0     Follow up: 12/15/2019 if symptoms worsen or fail to improve.  Dniya Neuhaus E Zanae Kuehnle 11/24/2019, 10:23 AM

## 2019-12-02 ENCOUNTER — Ambulatory Visit: Payer: Self-pay | Admitting: Pharmacy Technician

## 2019-12-02 ENCOUNTER — Other Ambulatory Visit: Payer: Self-pay

## 2019-12-02 DIAGNOSIS — Z79899 Other long term (current) drug therapy: Secondary | ICD-10-CM

## 2019-12-02 NOTE — Progress Notes (Signed)
Completed [facility] application and contract.  Patient agreed to all terms of the [facility] contract.    Patient approved to receive medication assistance at [facility] until time for re-certification in 5146, and as long as eligibility criteria continues to be met.   Provided patient with Civil engineer, contracting based on his particular needs.    Patient stated that he is experiencing a lot of pain on his right side.  I am sending a message to [facility]  [facility]

## 2019-12-07 ENCOUNTER — Other Ambulatory Visit: Payer: Self-pay

## 2019-12-07 DIAGNOSIS — Z7689 Persons encountering health services in other specified circumstances: Secondary | ICD-10-CM

## 2019-12-08 LAB — HEMOGLOBIN A1C
Est. average glucose Bld gHb Est-mCnc: 120 mg/dL
Hgb A1c MFr Bld: 5.8 % — ABNORMAL HIGH (ref 4.8–5.6)

## 2019-12-08 LAB — COMPREHENSIVE METABOLIC PANEL
ALT: 31 IU/L (ref 0–44)
AST: 31 IU/L (ref 0–40)
Albumin/Globulin Ratio: 1.6 (ref 1.2–2.2)
Albumin: 4.5 g/dL (ref 3.8–4.9)
Alkaline Phosphatase: 62 IU/L (ref 48–121)
BUN/Creatinine Ratio: 14 (ref 9–20)
BUN: 13 mg/dL (ref 6–24)
Bilirubin Total: 0.6 mg/dL (ref 0.0–1.2)
CO2: 22 mmol/L (ref 20–29)
Calcium: 9.8 mg/dL (ref 8.7–10.2)
Chloride: 104 mmol/L (ref 96–106)
Creatinine, Ser: 0.9 mg/dL (ref 0.76–1.27)
GFR calc Af Amer: 108 mL/min/{1.73_m2} (ref >59)
GFR calc non Af Amer: 93 mL/min/{1.73_m2} (ref >59)
Globulin, Total: 2.8 g/dL (ref 1.5–4.5)
Glucose: 128 mg/dL — ABNORMAL HIGH (ref 65–99)
Potassium: 4.2 mmol/L (ref 3.5–5.2)
Sodium: 140 mmol/L (ref 134–144)
Total Protein: 7.3 g/dL (ref 6.0–8.5)

## 2019-12-08 LAB — CBC WITH DIFFERENTIAL/PLATELET
Basophils Absolute: 0 10*3/uL (ref 0.0–0.2)
Basos: 0 %
EOS (ABSOLUTE): 0.4 10*3/uL (ref 0.0–0.4)
Eos: 3 %
Hematocrit: 38.2 % (ref 37.5–51.0)
Hemoglobin: 13.5 g/dL (ref 13.0–17.7)
Immature Grans (Abs): 0 10*3/uL (ref 0.0–0.1)
Immature Granulocytes: 0 %
Lymphocytes Absolute: 3.6 10*3/uL — ABNORMAL HIGH (ref 0.7–3.1)
Lymphs: 34 %
MCH: 33 pg (ref 26.6–33.0)
MCHC: 35.3 g/dL (ref 31.5–35.7)
MCV: 93 fL (ref 79–97)
Monocytes Absolute: 0.8 10*3/uL (ref 0.1–0.9)
Monocytes: 7 %
Neutrophils Absolute: 5.8 10*3/uL (ref 1.4–7.0)
Neutrophils: 56 %
Platelets: 263 10*3/uL (ref 150–450)
RBC: 4.09 x10E6/uL — ABNORMAL LOW (ref 4.14–5.80)
RDW: 13.1 % (ref 11.6–15.4)
WBC: 10.6 10*3/uL (ref 3.4–10.8)

## 2019-12-08 LAB — LIPID PANEL
Chol/HDL Ratio: 3.9 ratio (ref 0.0–5.0)
Cholesterol, Total: 163 mg/dL (ref 100–199)
HDL: 42 mg/dL (ref >39)
LDL Chol Calc (NIH): 107 mg/dL — ABNORMAL HIGH (ref 0–99)
Triglycerides: 73 mg/dL (ref 0–149)
VLDL Cholesterol Cal: 14 mg/dL (ref 5–40)

## 2019-12-13 ENCOUNTER — Other Ambulatory Visit: Payer: Self-pay

## 2019-12-13 ENCOUNTER — Ambulatory Visit: Payer: Self-pay | Admitting: Gerontology

## 2019-12-13 ENCOUNTER — Encounter: Payer: Self-pay | Admitting: Gerontology

## 2019-12-13 VITALS — BP 104/72 | HR 76 | Ht 69.0 in | Wt 145.0 lb

## 2019-12-13 DIAGNOSIS — E785 Hyperlipidemia, unspecified: Secondary | ICD-10-CM | POA: Insufficient documentation

## 2019-12-13 DIAGNOSIS — M25551 Pain in right hip: Secondary | ICD-10-CM

## 2019-12-13 DIAGNOSIS — R7303 Prediabetes: Secondary | ICD-10-CM

## 2019-12-13 DIAGNOSIS — G8929 Other chronic pain: Secondary | ICD-10-CM

## 2019-12-13 NOTE — Patient Instructions (Signed)
Hip Pain The hip is the joint between the upper legs and the lower pelvis. The bones, cartilage, tendons, and muscles of your hip joint support your body and allow you to move around. Hip pain can range from a minor ache to severe pain in one or both of your hips. The pain may be felt on the inside of the hip joint near the groin, or on the outside near the buttocks and upper thigh. You may also have swelling or stiffness in your hip area. Follow these instructions at home: Managing pain, stiffness, and swelling      If directed, put ice on the painful area. To do this: ? Put ice in a plastic bag. ? Place a towel between your skin and the bag. ? Leave the ice on for 20 minutes, 2-3 times a day.  If directed, apply heat to the affected area as often as told by your health care provider. Use the heat source that your health care provider recommends, such as a moist heat pack or a heating pad. ? Place a towel between your skin and the heat source. ? Leave the heat on for 20-30 minutes. ? Remove the heat if your skin turns bright red. This is especially important if you are unable to feel pain, heat, or cold. You may have a greater risk of getting burned. Activity  Do exercises as told by your health care provider.  Avoid activities that cause pain. General instructions   Take over-the-counter and prescription medicines only as told by your health care provider.  Keep a journal of your symptoms. Write down: ? How often you have hip pain. ? The location of your pain. ? What the pain feels like. ? What makes the pain worse.  Sleep with a pillow between your legs on your most comfortable side.  Keep all follow-up visits as told by your health care provider. This is important. Contact a health care provider if:  You cannot put weight on your leg.  Your pain or swelling continues or gets worse after one week.  It gets harder to walk.  You have a fever. Get help right away  if:  You fall.  You have a sudden increase in pain and swelling in your hip.  Your hip is red or swollen or very tender to touch. Summary  Hip pain can range from a minor ache to severe pain in one or both of your hips.  The pain may be felt on the inside of the hip joint near the groin, or on the outside near the buttocks and upper thigh.  Avoid activities that cause pain.  Write down how often you have hip pain, the location of the pain, what makes it worse, and what it feels like. This information is not intended to replace advice given to you by your health care provider. Make sure you discuss any questions you have with your health care provider. Document Revised: 09/13/2018 Document Reviewed: 09/13/2018 Elsevier Patient Education  [place].  Prediabetes Eating Plan Prediabetes is a condition that causes blood sugar (glucose) levels to be higher than normal. This increases the risk for developing diabetes. In order to prevent diabetes from developing, your health care provider may recommend a diet and other lifestyle changes to help you:  Control your blood glucose levels.  Improve your cholesterol levels.  Manage your blood pressure. Your health care provider may recommend working with a diet and nutrition specialist (dietitian) to make a meal plan  that is best for you. What are tips for following this plan? Lifestyle  Set weight loss goals with the help of your health care team. It is recommended that most people with prediabetes lose 7% of their current body weight.  Exercise for at least 30 minutes at least 5 days a week.  Attend a support group or seek ongoing support from a mental health counselor.  Take over-the-counter and prescription medicines only as told by your health care provider. Reading food labels  Read food labels to check the amount of fat, salt (sodium), and sugar in prepackaged foods. Avoid foods that have: ? Saturated fats. ? Trans  fats. ? Added sugars.  Avoid foods that have more than 300 milligrams (mg) of sodium per serving. Limit your daily sodium intake to less than 2,300 mg each day. Shopping  Avoid buying pre-made and processed foods. Cooking  Cook with olive oil. Do not use butter, lard, or ghee.  Bake, broil, grill, or boil foods. Avoid frying. Meal planning   Work with your dietitian to develop an eating plan that is right for you. This may include: ? Tracking how many calories you take in. Use a food diary, notebook, or mobile application to track what you eat at each meal. ? Using the glycemic index (GI) to plan your meals. The index tells you how quickly a food will raise your blood glucose. Choose low-GI foods. These foods take a longer time to raise blood glucose.  Consider following a Mediterranean diet. This diet includes: ? Several servings each day of fresh fruits and vegetables. ? Eating fish at least twice a week. ? Several servings each day of whole grains, beans, nuts, and seeds. ? Using olive oil instead of other fats. ? Moderate alcohol consumption. ? Eating small amounts of red meat and whole-fat dairy.  If you have high blood pressure, you may need to limit your sodium intake or follow a diet such as the DASH eating plan. DASH is an eating plan that aims to lower high blood pressure. What foods are recommended? The items listed below may not be a complete list. Talk with your dietitian about what dietary choices are best for you. Grains Whole grains, such as whole-wheat or whole-grain breads, crackers, cereals, and pasta. Unsweetened oatmeal. Bulgur. Barley. Quinoa. Brown rice. Corn or whole-wheat flour tortillas or taco shells. Vegetables Lettuce. Spinach. Peas. Beets. Cauliflower. Cabbage. Broccoli. Carrots. Tomatoes. Squash. Eggplant. Herbs. Peppers. Onions. Cucumbers. Brussels sprouts. Fruits Berries. Bananas. Apples. Oranges. Grapes. Papaya. Mango. Pomegranate. Kiwi.  Grapefruit. Cherries. Meats and other protein foods Seafood. Poultry without skin. Lean cuts of pork and beef. Tofu. Eggs. Nuts. Beans. Dairy Low-fat or fat-free dairy products, such as yogurt, cottage cheese, and cheese. Beverages Water. Tea. Coffee. Sugar-free or diet soda. Seltzer water. Lowfat or no-fat milk. Milk alternatives, such as soy or almond milk. Fats and oils Olive oil. Canola oil. Sunflower oil. Grapeseed oil. Avocado. Walnuts. Sweets and desserts Sugar-free or low-fat pudding. Sugar-free or low-fat ice cream and other frozen treats. Seasoning and other foods Herbs. Sodium-free spices. Mustard. Relish. Low-fat, low-sugar ketchup. Low-fat, low-sugar barbecue sauce. Low-fat or fat-free mayonnaise. What foods are not recommended? The items listed below may not be a complete list. Talk with your dietitian about what dietary choices are best for you. Grains Refined white flour and flour products, such as bread, pasta, snack foods, and cereals. Vegetables Canned vegetables. Frozen vegetables with butter or cream sauce. Fruits Fruits canned with syrup. Meats and other protein  foods Fatty cuts of meat. Poultry with skin. Breaded or fried meat. Processed meats. Dairy Full-fat yogurt, cheese, or milk. Beverages Sweetened drinks, such as sweet iced tea and soda. Fats and oils Butter. Lard. Ghee. Sweets and desserts Baked goods, such as cake, cupcakes, pastries, cookies, and cheesecake. Seasoning and other foods Spice mixes with added salt. Ketchup. Barbecue sauce. Mayonnaise. Summary  To prevent diabetes from developing, you may need to make diet and other lifestyle changes to help control blood sugar, improve cholesterol levels, and manage your blood pressure.  Set weight loss goals with the help of your health care team. It is recommended that most people with prediabetes lose 7 percent of their current body weight.  Consider following a Mediterranean diet that includes  plenty of fresh fruits and vegetables, whole grains, beans, nuts, seeds, fish, lean meat, low-fat dairy, and healthy oils. This information is not intended to replace advice given to you by your health care provider. Make sure you discuss any questions you have with your health care provider. Document Revised: 08/20/2018 Document Reviewed: 07/02/2016 Elsevier Patient Education  2020 [place].  Preventing High Cholesterol Cholesterol is a white, waxy substance similar to fat that the human body needs to help build cells. The liver makes all the cholesterol that a person's body needs. Having high cholesterol (hypercholesterolemia) increases a person's risk for heart disease and stroke. Extra (excess) cholesterol comes from the food the person eats. High cholesterol can often be prevented with diet and lifestyle changes. If you already have high cholesterol, you can control it with diet and lifestyle changes and with medicine. How can high cholesterol affect me? If you have high cholesterol, deposits (plaques) may build up on the walls of your arteries. The arteries are the blood vessels that carry blood away from your heart. Plaques make the arteries narrower and stiffer. This can limit or block blood flow and cause blood clots to form. Blood clots:  Are tiny balls of cells that form in your blood.  Can move to the heart or brain, causing a heart attack or stroke. Plaques in arteries greatly increase your risk for heart attack and stroke.Making diet and lifestyle changes can reduce your risk for these conditions that may threaten your life. What can increase my risk? This condition is more likely to develop in people who:  Eat foods that are high in saturated fat or cholesterol. Saturated fat is mostly found in: ? Foods that contain animal fat, such as red meat and some dairy products. ? Certain fatty foods made from plants, such as tropical oils.  Are overweight.  Are not getting enough  exercise.  Have a family history of high cholesterol. What actions can I take to prevent this? Nutrition   Eat less saturated fat.  Avoid trans fats (partially hydrogenated oils). These are often found in margarine and in some baked goods, fried foods, and snacks bought in packages.  Avoid precooked or cured meat, such as sausages or meat loaves.  Avoid foods and drinks that have added sugars.  Eat more fruits, vegetables, and whole grains.  Choose healthy sources of protein, such as fish, poultry, lean cuts of red meat, beans, peas, lentils, and nuts.  Choose healthy sources of fat, such as: ? Nuts. ? Vegetable oils, especially olive oil. ? Fish that have healthy fats (omega-3 fatty acids), such as mackerel or salmon. The items listed above may not be a complete list of recommended foods and beverages. Contact a dietitian for more  information. Lifestyle  Lose weight if you are overweight. Losing 5-10 lb (2.3-4.5 kg) can help prevent or control high cholesterol. It can also lower your risk for diabetes and high blood pressure. Ask your health care provider to help you with a diet and exercise plan to lose weight safely.  Do not use any products that contain nicotine or tobacco, such as cigarettes, e-cigarettes, and chewing tobacco. If you need help quitting, ask your health care provider.  Limit your alcohol intake. ? Do not drink alcohol if:  Your health care provider tells you not to drink.  You are pregnant, may be pregnant, or are planning to become pregnant. ? If you drink alcohol:  Limit how much you use to:  0-1 drink a day for women.  0-2 drinks a day for men.  Be aware of how much alcohol is in your drink. In the [place] one drink equals one 12 oz bottle of beer (355 mL), one 5 oz glass of wine (148 mL), or one 1 oz glass of hard liquor (44 mL). Activity   Get enough exercise. Each week, do at least 150 minutes of exercise that takes a medium level of effort  (moderate-intensity exercise). ? This is exercise that:  Makes your heart beat faster and makes you breathe harder than usual.  Allows you to still be able to talk. ? You could exercise in short sessions several times a day or longer sessions a few times a week. For example, on 5 days each week, you could walk fast or ride your bike 3 times a day for 10 minutes each time.  Do exercises as told by your health care provider. Medicines  In addition to diet and lifestyle changes, your health care provider may recommend medicines to help lower cholesterol. This may be a medicine to lower the amount of cholesterol your liver makes. You may need medicine if: ? Diet and lifestyle changes do not lower your cholesterol enough. ? You have high cholesterol and other risk factors for heart disease or stroke.  Take over-the-counter and prescription medicines only as told by your health care provider. General information  Manage your risk factors for high cholesterol. Talk with your health care provider about all your risk factors and how to lower your risk.  Manage other conditions that you have, such as diabetes or high blood pressure (hypertension).  Have blood tests to check your cholesterol levels at regular points in time as told by your health care provider.  Keep all follow-up visits as told by your health care provider. This is important. Where to find more information  American Heart Association: www.heart.org  National Heart, Lung, and Blood Institute: https://wilson-eaton.com/ Summary  High cholesterol increases your risk for heart disease and stroke. By keeping your cholesterol level low, you can reduce your risk for these conditions.  High cholesterol can often be prevented with diet and lifestyle changes.  Work with your health care provider to manage your risk factors, and have your blood tested regularly. This information is not intended to replace advice given to you by your health  care provider. Make sure you discuss any questions you have with your health care provider. Document Revised: 08/20/2018 Document Reviewed: 01/05/2016 Elsevier Patient Education  2020 [employer].

## 2019-12-13 NOTE — Progress Notes (Signed)
Established Patient Office Visit  Subjective:  Patient ID: Edward Macias, male    DOB: [birth year]  Age: 59 y.o. MRN: [id number]  CC: No chief complaint on file.   HPI [name] presents for follow up of right hip pain and lab review. He states that he's compliant with his medication and continues to make healthy lifestyle modifications. He states that he continues to experience intermittent stabbing 8/10 pain to right hip that radiates to his ankle. He states that changing position from a lying or sitting to standing, wearing his pants and socks aggravates symptom. He reports that taking Meloxicam offers no relief. He denies muscle or motor weakness and paresthesia. His HgbA1c done on 12/07/2019 was 5.8%, LDL was 107 mg/dl. Overall, he states that he's doing well, concerned about his right hip pain and offers no further complaint.  Past Medical History:  Diagnosis Date  . Arthritis   . Asthma    AS A CHILD-NO INHALERS    Past Surgical History:  Procedure Laterality Date  . INGUINAL HERNIA REPAIR Left 10/03/2016   Procedure: HERNIA REPAIR INGUINAL ADULT;  Surgeon: Leonie Green, MD;  Location: [facility] ORS;  Service: General;  Laterality: Left;  . NO PAST SURGERIES      No family history on file.  Social History   Socioeconomic History  . Marital status: Single    Spouse name: Not on file  . Number of children: Not on file  . Years of education: Not on file  . Highest education level: Not on file  Occupational History    Comment: [employer]  Tobacco Use  . Smoking status: Current Every Day Smoker    Packs/day: 1.00    Years: 30.00    Pack years: 30.00    Types: Cigarettes  . Smokeless tobacco: Never Used  Vaping Use  . Vaping Use: Never used  Substance and Sexual Activity  . Alcohol use: Yes    Comment: occasional  . Drug use: No  . Sexual activity: Not on file  Other Topics Concern  . Not on file  Social History Narrative  . Not on file   Social  Determinants of Health   Financial Resource Strain:   . Difficulty of Paying Living Expenses:   Food Insecurity:   . Worried About Charity fundraiser in the Last Year:   . Arboriculturist in the Last Year:   Transportation Needs:   . Film/video editor (Medical):   Marland Kitchen Lack of Transportation (Non-Medical):   Physical Activity:   . Days of Exercise per Week:   . Minutes of Exercise per Session:   Stress:   . Feeling of Stress :   Social Connections:   . Frequency of Communication with Friends and Family:   . Frequency of Social Gatherings with Friends and Family:   . Attends Religious Services:   . Active Member of Clubs or Organizations:   . Attends Archivist Meetings:   Marland Kitchen Marital Status:   Intimate Partner Violence:   . Fear of Current or Ex-Partner:   . Emotionally Abused:   Marland Kitchen Physically Abused:   . Sexually Abused:     Outpatient Medications Prior to Visit  Medication Sig Dispense Refill  . albuterol (VENTOLIN HFA) 108 (90 Base) MCG/ACT inhaler Inhale 2 puffs into the lungs every 6 (six) hours as needed for wheezing or shortness of breath. 18 g 1  . loratadine (CLARITIN) 10 MG tablet Take 10 mg by mouth  daily as needed for allergies or rhinitis.    . meloxicam (MOBIC) 15 MG tablet Take 1 tablet (15 mg total) by mouth daily. 30 tablet 0   No facility-administered medications prior to visit.    No Known Allergies  ROS Review of Systems  Constitutional: Negative.   Eyes: Negative.   Respiratory: Negative.   Cardiovascular: Negative.   Endocrine: Negative.   Musculoskeletal: Positive for arthralgias (Chronic right hip pain).  Neurological: Negative.   Psychiatric/Behavioral: Negative.       Objective:    Physical Exam HENT:     Head: Normocephalic and atraumatic.  Eyes:     Extraocular Movements: Extraocular movements intact.     Pupils: Pupils are equal, round, and reactive to light.  Cardiovascular:     Rate and Rhythm: Normal rate and  regular rhythm.     Pulses: Normal pulses.     Heart sounds: Normal heart sounds.  Pulmonary:     Effort: Pulmonary effort is normal.     Breath sounds: Normal breath sounds.  Musculoskeletal:        General: Tenderness (tenderness with mild palpitation of right hip) present.  Skin:    General: Skin is warm.  Neurological:     General: No focal deficit present.     Mental Status: He is alert and oriented to person, place, and time. Mental status is at baseline.  Psychiatric:        Mood and Affect: Mood normal.        Behavior: Behavior normal.        Thought Content: Thought content normal.        Judgment: Judgment normal.     BP 104/72 (BP Location: Left Arm, Patient Position: Sitting)   Pulse 76   Ht 5\' 9"  (1.753 m)   Wt 145 lb (65.8 kg)   SpO2 95%   BMI 21.41 kg/m  Wt Readings from Last 3 Encounters:  12/13/19 145 lb (65.8 kg)  12/08/19 146 lb 6.4 oz (66.4 kg)  11/24/19 151 lb (68.5 kg)     Health Maintenance Due  Topic Date Due  . Hepatitis C Screening  Never done  . COVID-19 Vaccine (1) Never done  . TETANUS/TDAP  Never done  . INFLUENZA VACCINE  12/11/2019    There are no preventive care reminders to display for this patient.  No results found for: TSH Lab Results  Component Value Date   WBC 10.6 12/07/2019   HGB 13.5 12/07/2019   HCT 38.2 12/07/2019   MCV 93 12/07/2019   PLT 263 12/07/2019   Lab Results  Component Value Date   NA 140 12/07/2019   K 4.2 12/07/2019   CO2 22 12/07/2019   GLUCOSE 128 (H) 12/07/2019   BUN 13 12/07/2019   CREATININE 0.90 12/07/2019   BILITOT 0.6 12/07/2019   ALKPHOS 62 12/07/2019   AST 31 12/07/2019   ALT 31 12/07/2019   PROT 7.3 12/07/2019   ALBUMIN 4.5 12/07/2019   CALCIUM 9.8 12/07/2019   ANIONGAP 11 04/10/2019   Lab Results  Component Value Date   CHOL 163 12/07/2019   Lab Results  Component Value Date   HDL 42 12/07/2019   Lab Results  Component Value Date   LDLCALC 107 (H) 12/07/2019   Lab  Results  Component Value Date   TRIG 73 12/07/2019   Lab Results  Component Value Date   CHOLHDL 3.9 12/07/2019   Lab Results  Component Value Date   HGBA1C 5.8 (H)  12/07/2019      Assessment & Plan:   1. Chronic right hip pain - He was advised to continue Meloxicam 15 mg daily and Tylenol 650 mg po every 8 hours as needed  -He was advised to follow up with Orthopedic Surgeon Dr Vickki Hearing on 12/20/2019. -He was advised to go to the ED with worsening symptoms.  2. Prediabetes - His HgbA1c was 5.8%, he declines Metformin therapy stating that he will make dietary changes. - He was advised to continue on Low carb/non concentrated sweet diet. - Urinalysis; Future - HgB A1c; Future  3. Elevated lipids The 10-year ASCVD risk score Mikey Bussing DC Jr., et al., 2013) is: 9.2%   Values used to calculate the score:     Age: 59 years     Sex: Male     Is Non-Hispanic African American: Yes     Diabetic: No     Tobacco smoker: Yes     Systolic Blood Pressure: 883 mmHg     Is BP treated: No     HDL Cholesterol: 42 mg/dL     Total Cholesterol: 163 mg/dL He declines Statin therapy, stated that he will make lifestyle modifications. He was encouraged on smoking cessation, provided [place] Quit line information. -Low fat Diet, like low fat dairy products eg skimmed milk -Avoid any fried food -Regular exercise/walk -Goal for Total Cholesterol is less than 200 -Goal for bad cholesterol LDL is less than 100 -Goal for Good cholesterol HDL is more than 45 -Goal for Triglyceride is less than 150       Follow-up: Return in about 3 months (around 03/14/2020).    Toniyah Dilmore Jerold Coombe, NP

## 2019-12-14 ENCOUNTER — Other Ambulatory Visit: Payer: Self-pay | Admitting: Gerontology

## 2019-12-14 DIAGNOSIS — F17210 Nicotine dependence, cigarettes, uncomplicated: Secondary | ICD-10-CM

## 2019-12-15 ENCOUNTER — Telehealth: Payer: Self-pay | Admitting: *Deleted

## 2019-12-15 DIAGNOSIS — Z122 Encounter for screening for malignant neoplasm of respiratory organs: Secondary | ICD-10-CM

## 2019-12-15 DIAGNOSIS — Z87891 Personal history of nicotine dependence: Secondary | ICD-10-CM

## 2019-12-15 NOTE — Telephone Encounter (Signed)
Received referral for initial lung cancer screening scan. Contacted patient and obtained smoking history,(current, 39 pack year) as well as answering questions related to screening process. Patient denies signs of lung cancer such as weight loss or hemoptysis. Patient denies comorbidity that would prevent curative treatment if lung cancer were found. Patient is scheduled for shared decision making visit and CT scan on 01/03/20 at 115pm.

## 2019-12-20 ENCOUNTER — Other Ambulatory Visit: Payer: Self-pay

## 2019-12-20 ENCOUNTER — Ambulatory Visit: Payer: Self-pay | Admitting: Specialist

## 2019-12-20 DIAGNOSIS — M25551 Pain in right hip: Secondary | ICD-10-CM

## 2019-12-20 NOTE — Progress Notes (Signed)
   Subjective:    Patient ID: Edward Macias, male    DOB: 01-23-1961, 59 y.o.   MRN: [id number]  HPI 59 year old with a Hx of diabetic neuropathy, although in today's physical, he showed no signs of this. He's complaining of a 2 month Hx of R hip pain, but the pain is located in his buttock region.   Review of Systems     Objective:   Physical Exam He points to the area of his buttock as the source of pain but then radiates down the leg to the foot. This is associated with N/T.   When he stands, he has a slight forward list. His gait is normal. He is able to heel/toe walk. He is not TTP over the back. He can march in place with normal muscle recruitment and relaxation, although his paraspinal muscles are taught. He can stand on each leg independently with a neg. Trendelenburg sign.   ROM: lateral bending 30/30; forward flex 40 degrees; EXT 30 degrees.  DTR's 2+ at the knees, O at the ankles, even with reinforcement.   Sensation, muscle power, vibratory, and temperature are all intact.   R hip has a full ROM.       Assessment & Plan:  I want to xray not only his hip but also his lower back.

## 2020-01-03 ENCOUNTER — Encounter: Payer: Self-pay | Admitting: Oncology

## 2020-01-03 ENCOUNTER — Inpatient Hospital Stay: Payer: Self-pay | Admitting: Oncology

## 2020-01-03 ENCOUNTER — Ambulatory Visit: Admission: RE | Admit: 2020-01-03 | Payer: Self-pay | Source: Ambulatory Visit

## 2020-01-04 ENCOUNTER — Other Ambulatory Visit: Payer: Self-pay

## 2020-01-04 ENCOUNTER — Ambulatory Visit
Admission: RE | Admit: 2020-01-04 | Discharge: 2020-01-04 | Disposition: A | Discharge status: Home / Self Care | Payer: Self-pay | Attending: Specialist | Admitting: Specialist

## 2020-01-04 ENCOUNTER — Ambulatory Visit
Admission: RE | Admit: 2020-01-04 | Discharge: 2020-01-04 | Disposition: A | Discharge status: Home / Self Care | Payer: Self-pay | Source: Ambulatory Visit | Attending: Specialist | Admitting: Specialist

## 2020-01-04 DIAGNOSIS — G8929 Other chronic pain: Secondary | ICD-10-CM | POA: Insufficient documentation

## 2020-01-04 DIAGNOSIS — M25551 Pain in right hip: Secondary | ICD-10-CM

## 2020-01-10 ENCOUNTER — Other Ambulatory Visit: Payer: Self-pay

## 2020-01-10 ENCOUNTER — Ambulatory Visit
Admission: RE | Admit: 2020-01-10 | Discharge: 2020-01-10 | Disposition: A | Discharge status: Home / Self Care | Payer: Self-pay | Source: Ambulatory Visit | Attending: Oncology | Admitting: Oncology

## 2020-01-10 ENCOUNTER — Inpatient Hospital Stay: Payer: Self-pay | Attending: Nurse Practitioner | Admitting: Nurse Practitioner

## 2020-01-10 DIAGNOSIS — Z122 Encounter for screening for malignant neoplasm of respiratory organs: Secondary | ICD-10-CM | POA: Insufficient documentation

## 2020-01-10 DIAGNOSIS — Z87891 Personal history of nicotine dependence: Secondary | ICD-10-CM

## 2020-01-10 NOTE — Progress Notes (Signed)
Virtual Visit via Video Enabled Telemedicine Note   I connected with Edward Macias on 01/10/20 at 1:30 PM EST by video enabled telemedicine visit and verified that I am speaking with the correct person using two identifiers.   I discussed the limitations, risks, security and privacy concerns of performing an evaluation and management service by telemedicine and the availability of in-person appointments. I also discussed with the patient that there may be a patient responsible charge related to this service. The patient expressed understanding and agreed to proceed.   Other persons participating in the visit and their role in the encounter: Burgess Estelle, RN- checking in patient & navigation  Patient's location: [place]  Provider's location: Clinic  Chief Complaint: Low Dose CT Screening  Patient agreed to evaluation by telemedicine to discuss shared decision making for consideration of low dose CT lung cancer screening.    In accordance with CMS guidelines, patient has met eligibility criteria including age, absence of signs or symptoms of lung cancer.  Social History   Tobacco Use  . Smoking status: Current Every Day Smoker    Packs/day: 1.00    Years: 39.00    Pack years: 39.00    Types: Cigarettes  . Smokeless tobacco: Never Used  Substance Use Topics  . Alcohol use: Yes    Comment: occasional     A shared decision-making session was conducted prior to the performance of CT scan. This includes one or more decision aids, includes benefits and harms of screening, follow-up diagnostic testing, over-diagnosis, false positive rate, and total radiation exposure.   Counseling on the importance of adherence to annual lung cancer LDCT screening, impact of co-morbidities, and ability or willingness to undergo diagnosis and treatment is imperative for compliance of the program.   Counseling on the importance of continued smoking cessation for former smokers; the importance of  smoking cessation for current smokers, and information about tobacco cessation interventions have been given to patient including [place] and [employer] [place] programs.   Written order for lung cancer screening with LDCT has been given to the patient and any and all questions have been answered to the best of my abilities.    Yearly follow up will be coordinated by Burgess Estelle, Thoracic Navigator.  I discussed the assessment and treatment plan with the patient. The patient was provided an opportunity to ask questions and all were answered. The patient agreed with the plan and demonstrated an understanding of the instructions.   The patient was advised to call back or seek an in-person evaluation if the symptoms worsen or if the condition fails to improve as anticipated.   I provided 15 minutes of face-to-face video visit time during this encounter, and > 50% was spent counseling as documented under my assessment & plan.   Beckey Rutter, DNP, AGNP-C [place] [facility] [phone] (clinic)

## 2020-01-19 ENCOUNTER — Encounter: Payer: Self-pay | Admitting: *Deleted

## 2020-01-24 ENCOUNTER — Encounter: Payer: Self-pay | Admitting: Specialist

## 2020-01-24 ENCOUNTER — Other Ambulatory Visit: Payer: Self-pay

## 2020-01-24 ENCOUNTER — Ambulatory Visit: Payer: Self-pay | Admitting: Specialist

## 2020-01-24 DIAGNOSIS — M25551 Pain in right hip: Secondary | ICD-10-CM

## 2020-01-24 DIAGNOSIS — G8929 Other chronic pain: Secondary | ICD-10-CM
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
## 2020-01-25 ENCOUNTER — Other Ambulatory Visit: Payer: Self-pay

## 2020-01-25 DIAGNOSIS — M25551 Pain in right hip: Secondary | ICD-10-CM

## 2020-01-25 MED ORDER — MELOXICAM 15 MG PO TABS: 15.0000 mg | ORAL_TABLET | Freq: Every day | ORAL | 2 refills | Status: DC

## 2020-01-26 ENCOUNTER — Ambulatory Visit
Admission: RE | Admit: 2020-01-26 | Discharge: 2020-01-26 | Disposition: A | Discharge status: Home / Self Care | Payer: Self-pay | Source: Ambulatory Visit | Attending: Gerontology | Admitting: Gerontology

## 2020-01-26 ENCOUNTER — Other Ambulatory Visit: Payer: Self-pay

## 2020-01-26 ENCOUNTER — Ambulatory Visit
Admission: RE | Admit: 2020-01-26 | Discharge: 2020-01-26 | Disposition: A | Discharge status: Home / Self Care | Payer: Self-pay | Attending: Gerontology | Admitting: Gerontology

## 2020-01-26 DIAGNOSIS — M25551 Pain in right hip: Secondary | ICD-10-CM | POA: Insufficient documentation

## 2020-01-26 DIAGNOSIS — G8929 Other chronic pain: Secondary | ICD-10-CM | POA: Insufficient documentation

## 2020-02-07 ENCOUNTER — Ambulatory Visit: Payer: Self-pay | Admitting: Specialist

## 2020-03-06 ENCOUNTER — Ambulatory Visit: Payer: Self-pay | Admitting: Specialist

## 2020-03-06 ENCOUNTER — Other Ambulatory Visit: Payer: Self-pay

## 2020-03-06 DIAGNOSIS — G8929 Other chronic pain: Secondary | ICD-10-CM
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
## 2020-03-07 ENCOUNTER — Other Ambulatory Visit: Payer: Self-pay

## 2020-03-07 DIAGNOSIS — R7303 Prediabetes: Secondary | ICD-10-CM

## 2020-03-08 LAB — URINALYSIS
Bilirubin, UA: NEGATIVE
Glucose, UA: NEGATIVE
Nitrite, UA: NEGATIVE
Specific Gravity, UA: 1.023 (ref 1.005–1.030)
Urobilinogen, Ur: 2 mg/dL — ABNORMAL HIGH (ref 0.2–1.0)
pH, UA: 6.5 (ref 5.0–7.5)

## 2020-03-08 LAB — HEMOGLOBIN A1C
Est. average glucose Bld gHb Est-mCnc: 108 mg/dL
Hgb A1c MFr Bld: 5.4 % (ref 4.8–5.6)

## 2020-03-14 ENCOUNTER — Ambulatory Visit: Payer: Self-pay | Admitting: Gerontology

## 2020-03-22 ENCOUNTER — Ambulatory Visit: Payer: Self-pay | Admitting: Gerontology

## 2020-03-22 ENCOUNTER — Telehealth: Payer: Self-pay | Admitting: *Deleted

## 2020-03-22 NOTE — Telephone Encounter (Signed)
Unable to LVM for pt to reschedule no showed appt at 4:00 on 11/16. -CV

## 2020-03-27 ENCOUNTER — Telehealth: Payer: Self-pay | Admitting: Gerontology

## 2020-04-17 ENCOUNTER — Other Ambulatory Visit: Payer: Self-pay | Admitting: Physician Assistant

## 2020-04-17 ENCOUNTER — Other Ambulatory Visit: Payer: Self-pay

## 2020-04-17 ENCOUNTER — Ambulatory Visit: Payer: Self-pay | Admitting: Physician Assistant

## 2020-04-17 VITALS — BP 132/78 | HR 96 | Temp 99.4°F | Resp 12 | Ht 69.0 in | Wt 145.3 lb

## 2020-04-17 DIAGNOSIS — Z8709 Personal history of other diseases of the respiratory system: Secondary | ICD-10-CM

## 2020-04-17 DIAGNOSIS — R899 Unspecified abnormal finding in specimens from other organs, systems and tissues: Secondary | ICD-10-CM

## 2020-04-17 DIAGNOSIS — Z87898 Personal history of other specified conditions: Secondary | ICD-10-CM | POA: Insufficient documentation

## 2020-04-17 DIAGNOSIS — Z720 Tobacco use: Secondary | ICD-10-CM | POA: Insufficient documentation

## 2020-04-17 DIAGNOSIS — J449 Chronic obstructive pulmonary disease, unspecified: Secondary | ICD-10-CM | POA: Insufficient documentation

## 2020-04-17 MED ORDER — ALBUTEROL SULFATE HFA 108 (90 BASE) MCG/ACT IN AERS: 2.0000 | INHALATION_SPRAY | Freq: Four times a day (QID) | RESPIRATORY_TRACT | 1 refills | Status: DC | PRN

## 2020-04-17 MED ORDER — TIOTROPIUM BROMIDE MONOHYDRATE 18 MCG IN CAPS: 18.0000 ug | ORAL_CAPSULE | Freq: Every day | RESPIRATORY_TRACT | 0 refills | Status: DC

## 2020-04-17 NOTE — Progress Notes (Signed)
Patient: Edward Macias Male    DOB: 05/03/1961   59 y.o.   MRN: [id number] Visit Date: 04/17/2020  Today's Provider: Elwyn Reach, PA-C   Chief Complaint  Patient presents with  . Follow-up   Subjective:    Pt presents for follow-up to review labs. His chart has a diagnosis of prediabetes and previous A1cs w/in range for prediabetes, but the most recent A1c (5.4%) is below lower limit for prediabetes. UA was ordered for prediabetes, but this seems of unclear value, particularly considering his most recent A1c is 5.4% (urinary indices are of clearer value in DM, specifically creatinine and urine albumin-to-creatinine ratio). Nevertheless, UA shows 1+ protein and 2+ leuks.  Pt reports otherwise feeling well, though on ROS, he does report feeling a bit "wheezy", and he relays that he's been told he has COPD. He continues to smoke about 1 ppd and is not ready to quit. He also needs a refill on his albuterol.    No Known Allergies Previous Medications   ALBUTEROL (VENTOLIN HFA) 108 (90 BASE) MCG/ACT INHALER    Inhale 2 puffs into the lungs every 6 (six) hours as needed for wheezing or shortness of breath.   LORATADINE (CLARITIN) 10 MG TABLET    Take 10 mg by mouth daily as needed for allergies or rhinitis.    Review of systems (may be based on changes from baseline unless otherwise stated) See HPI as well General: no fever, chills, diaphoresis, night sweats, anorexia, malaise, fatigue, or weight change Eyes: no visual blurring, double vision, or other ocular symptoms (latter asked generically) Respiratory: no SOB, DOE, cough, orthopnea, or PND; does say he has some wheezing from time to time (see HPI) Cardiovascular: no chest pain; chest pressure; pain or pressure in neck, arm, jaw, or epigastric area; palpitations; or edema Genitourinary: no dysuria, increased frequency, or other GU symptoms (latter asked generically)  Social History   Tobacco Use  . Smoking status: Current Every Day  Smoker    Packs/day: 1.00    Years: 39.00    Pack years: 39.00    Types: Cigarettes  . Smokeless tobacco: Never Used  Substance Use Topics  . Alcohol use: Yes    Comment: occasional   Objective:   BP 132/78 (BP Location: Right Arm, Patient Position: Sitting)   Pulse 96   Temp 99.4 F (37.4 C)   Resp 12   Ht 5\' 9"  (1.753 m)   Wt 145 lb 4.8 oz (65.9 kg)   SpO2 97%   BMI 21.46 kg/m   Physical exam Constitutional: A&Ox3, NAD, pleasant, cooperative Respiratory: breathing unlabored, good and symmetric chest expansion and air entry, lungs largely clear to auscultation bilaterally, but with some mild expiratory wheezing bilaterally and with some mild rhonchi that clear completely with volitional cough Cardiovascular: no JVD noted/JVP does not appear outside normal limits; no edema noted; RRR, S1, S2; no S3 or S4 appreciated; no murmurs, clicks, gallops, rubs     Assessment & Plan:     COPD -Disc R/B/A of inhaler options, and he would like to try Spiriva. I instructed on proper use. -Refill albuterol -Advised on need to stop smoking, advised on quit line, advised on smoking cessation therapies. He is not ready to quit at this time. Encouraged him to continue to consider it and to follow-up with [place] if we can be of help.  Tobacco abuse -See above  Prediabetes -I have resolved this as a problem and moved it to history of prediabetes, as his  most recent A1c no longer meets criteria for prediabetes; can reinstate as active if it recurs. -Discussed importance of diet and exercise  Lab abnormality -Given UA isn't totally normal, will order a repeat to occur before next visit. Although anticipate normalization, will proceed accordingly based on results. -If prediabetes recurs, would consider not pursuing additional urinary testing for prediabetes alone; would reserve for DM diagnosis, and in that setting, would give more consideration to the creatinine and urine albumin-to-creatinine ratio  in this setting    Follow-up In 2-3 weeks to assess response to Spiriva; f/u sooner if needed  Elwyn Reach, PA-C   [facility]

## 2020-04-19 ENCOUNTER — Telehealth: Payer: Self-pay | Admitting: Pharmacist

## 2020-04-19 NOTE — Telephone Encounter (Signed)
04/19/2020 10:10:31 AM - Spiriva & ProAir forms to pt & dr  -- Edward Macias - Thursday, April 19, 2020 10:07 AM --Received pharmacy printout for the following meds, printed forms for patient to sign-put in bag with meds on the wall, also to [facility]--  ProAir HFA (for Albuterol HFA) Inhale 2 puffs every 6 hours as needed for wheezing or shortness of breath  # 4                                           & Spiriva Handihaler 28mcg Inhale the contents of 1 capsule daily using handihaler # 90  (also letter for patient to sign to ship to [place])

## 2020-04-25 ENCOUNTER — Telehealth: Payer: Self-pay | Admitting: Pharmacist

## 2020-04-25 NOTE — Telephone Encounter (Signed)
04/25/2020 12:39:06 PM - Spiriva & ProAir pending  -- Elmer Picker - Wednesday, April 25, 2020 12:37 PM --I have received the signed provider portion of [place] applications back, holding for patient portion-was put in bag 04/19/2020 for patient to sign when meds on the wall are picked up.

## 2020-05-09 ENCOUNTER — Other Ambulatory Visit: Payer: Self-pay

## 2020-05-09 ENCOUNTER — Telehealth: Payer: Self-pay | Admitting: *Deleted

## 2020-05-09 NOTE — Telephone Encounter (Signed)
Error

## 2020-05-10 ENCOUNTER — Ambulatory Visit: Payer: Self-pay

## 2020-05-16 ENCOUNTER — Other Ambulatory Visit: Payer: Self-pay

## 2020-05-16 DIAGNOSIS — R899 Unspecified abnormal finding in specimens from other organs, systems and tissues: Secondary | ICD-10-CM

## 2020-05-17 LAB — URINALYSIS, ROUTINE W REFLEX MICROSCOPIC
Bilirubin, UA: NEGATIVE
Nitrite, UA: NEGATIVE
Protein,UA: NEGATIVE
RBC, UA: NEGATIVE
Specific Gravity, UA: 1.022 (ref 1.005–1.030)
Urobilinogen, Ur: 1 mg/dL (ref 0.2–1.0)
pH, UA: 5.5 (ref 5.0–7.5)

## 2020-05-17 LAB — MICROSCOPIC EXAMINATION
Bacteria, UA: NONE SEEN
Casts: NONE SEEN /lpf
Epithelial Cells (non renal): NONE SEEN /hpf (ref 0–10)
WBC, UA: >30 /hpf — AB (ref 0–5)

## 2020-05-24 ENCOUNTER — Ambulatory Visit: Payer: Self-pay

## 2020-07-19 ENCOUNTER — Telehealth: Payer: Self-pay | Admitting: Pharmacist

## 2020-07-19 NOTE — Telephone Encounter (Signed)
Patient failed to provide requested 2022 financial documentation. Unable to determine patient's eligibility status. No additional medication assistance will be provided by [facility] without the required proof of income documentation. Patient notified by letter.  [place]

## 2020-09-28 ENCOUNTER — Other Ambulatory Visit: Payer: Self-pay

## 2020-11-29 ENCOUNTER — Other Ambulatory Visit: Payer: Self-pay
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
## 2021-03-21 ENCOUNTER — Other Ambulatory Visit: Payer: Self-pay

## 2021-06-05 ENCOUNTER — Telehealth: Payer: Self-pay | Admitting: Acute Care

## 2021-06-05 NOTE — Telephone Encounter (Signed)
Attempted to reach by phone to schedule annual LDCT.  Phone has repeating fast busy signal. Unable to reach or leave Voicemail.

## 2021-08-02 ENCOUNTER — Other Ambulatory Visit: Payer: Self-pay

## 2021-09-09 ENCOUNTER — Ambulatory Visit: Payer: Self-pay | Admitting: Pharmacy Technician

## 2021-09-09 ENCOUNTER — Other Ambulatory Visit: Payer: Self-pay

## 2021-09-09 DIAGNOSIS — Z79899 Other long term (current) drug therapy: Secondary | ICD-10-CM

## 2021-09-09 NOTE — Progress Notes (Signed)
Patient provided [facility] Attestation. ? ?Edward Macias ?Care Manager ?[facility] ?

## 2021-09-12 ENCOUNTER — Other Ambulatory Visit: Payer: Self-pay

## 2022-02-26 ENCOUNTER — Other Ambulatory Visit: Payer: Self-pay

## 2022-02-26 ENCOUNTER — Emergency Department
Admission: EM | Admit: 2022-02-26 | Discharge: 2022-02-26 | Disposition: A | Discharge status: Home / Self Care | Payer: 59 | Attending: Emergency Medicine | Admitting: Emergency Medicine

## 2022-02-26 ENCOUNTER — Emergency Department: Payer: 59

## 2022-02-26 DIAGNOSIS — R1030 Lower abdominal pain, unspecified: Secondary | ICD-10-CM | POA: Diagnosis present

## 2022-02-26 DIAGNOSIS — J449 Chronic obstructive pulmonary disease, unspecified: Secondary | ICD-10-CM | POA: Diagnosis not present

## 2022-02-26 DIAGNOSIS — R7401 Elevation of levels of liver transaminase levels: Secondary | ICD-10-CM | POA: Diagnosis not present

## 2022-02-26 DIAGNOSIS — K4090 Unilateral inguinal hernia, without obstruction or gangrene, not specified as recurrent: Secondary | ICD-10-CM | POA: Diagnosis not present

## 2022-02-26 LAB — COMPREHENSIVE METABOLIC PANEL
ALT: 42 U/L (ref 0–44)
AST: 73 U/L — ABNORMAL HIGH (ref 15–41)
Albumin: 4 g/dL (ref 3.5–5.0)
Alkaline Phosphatase: 53 U/L (ref 38–126)
Anion gap: 11 (ref 5–15)
BUN: 16 mg/dL (ref 8–23)
CO2: 24 mmol/L (ref 22–32)
Calcium: 9 mg/dL (ref 8.9–10.3)
Chloride: 102 mmol/L (ref 98–111)
Creatinine, Ser: 0.68 mg/dL (ref 0.61–1.24)
GFR, Estimated: >60 mL/min (ref >60)
Glucose, Bld: 79 mg/dL (ref 70–99)
Potassium: 4.3 mmol/L (ref 3.5–5.1)
Sodium: 137 mmol/L (ref 135–145)
Total Bilirubin: 0.9 mg/dL (ref 0.3–1.2)
Total Protein: 7.3 g/dL (ref 6.5–8.1)

## 2022-02-26 LAB — CBC WITH DIFFERENTIAL/PLATELET
Abs Immature Granulocytes: 0.04 10*3/uL (ref 0.00–0.07)
Basophils Absolute: 0.1 10*3/uL (ref 0.0–0.1)
Basophils Relative: 1 %
Eosinophils Absolute: 0.1 10*3/uL (ref 0.0–0.5)
Eosinophils Relative: 2 %
HCT: 45.3 % (ref 39.0–52.0)
Hemoglobin: 15 g/dL (ref 13.0–17.0)
Immature Granulocytes: 1 %
Lymphocytes Relative: 26 %
Lymphs Abs: 2.2 10*3/uL (ref 0.7–4.0)
MCH: 31.8 pg (ref 26.0–34.0)
MCHC: 33.1 g/dL (ref 30.0–36.0)
MCV: 96.2 fL (ref 80.0–100.0)
Monocytes Absolute: 0.8 10*3/uL (ref 0.1–1.0)
Monocytes Relative: 10 %
Neutro Abs: 5 10*3/uL (ref 1.7–7.7)
Neutrophils Relative %: 60 %
Platelets: 242 10*3/uL (ref 150–400)
RBC: 4.71 MIL/uL (ref 4.22–5.81)
RDW: 17.5 % — ABNORMAL HIGH (ref 11.5–15.5)
WBC: 8.3 10*3/uL (ref 4.0–10.5)
nRBC: 0 % (ref 0.0–0.2)

## 2022-02-26 LAB — URINALYSIS, ROUTINE W REFLEX MICROSCOPIC
Bacteria, UA: NONE SEEN
Bilirubin Urine: NEGATIVE
Glucose, UA: NEGATIVE mg/dL
Ketones, ur: NEGATIVE mg/dL
Leukocytes,Ua: NEGATIVE
Nitrite: NEGATIVE
Protein, ur: NEGATIVE mg/dL
Specific Gravity, Urine: >1.046 — ABNORMAL HIGH (ref 1.005–1.030)
pH: 6 (ref 5.0–8.0)

## 2022-02-26 MED ORDER — IOHEXOL 300 MG/ML  SOLN
100.0000 mL | Freq: Once | INTRAMUSCULAR | Status: AC | PRN
  Administered 2022-02-26: 100 mL via INTRAVENOUS

## 2022-02-26 MED ORDER — HYDROMORPHONE HCL 1 MG/ML IJ SOLN
0.5000 mg | Freq: Once | INTRAMUSCULAR | Status: AC
  Administered 2022-02-26: 0.5 mg via INTRAVENOUS
  Filled 2022-02-26: qty 0.5

## 2022-02-26 MED ORDER — SULFAMETHOXAZOLE-TRIMETHOPRIM 800-160 MG PO TABS: 1.0000 | ORAL_TABLET | Freq: Two times a day (BID) | ORAL | 0 refills | Status: AC

## 2022-02-26 NOTE — ED Provider Notes (Addendum)
Edward Macias Provider Note    Event Date/Time   First MD Initiated Contact with Patient 02/26/22 1255     (approximate)   History   Groin Pain (C/o painful hernia in suprapubic area, pt. States he has known hernia there, it "popped out" this week, and he has been unable to reduce it. Pt. Has had hernia reduction previously. Pt. States pain increasing in severity. No meds today. Denies bowel symptoms. Pt. States painful urination.) and Hernia   HPI  Edward Macias is a 61 y.o. male with COPD, history of a hernia who comes in with concerns for his hernia popping out.  Patient reports the area popped out about a week ago.  Patient reports that is on his left side.  This is the same side that he had surgery on before to repair it.  He is not sure how long its been out for a total but he reports that previously he was able to push it in when it did pop out and now his consistently has been staying out for over a week.  He reports increasing pain to this area.  I reviewed the notes from May 2018 where patient underwent a surgery for hernia repair on the left.  Physical Exam   Triage Vital Signs: ED Triage Vitals  Enc Vitals Group     BP 02/26/22 1220 (!) 152/90     Pulse Rate 02/26/22 1220 84     Resp 02/26/22 1220 18     Temp 02/26/22 1220 97.8 F (36.6 C)     Temp Source 02/26/22 1220 Oral     SpO2 02/26/22 1220 94 %     Weight 02/26/22 1222 145 lb (65.8 kg)     Height 02/26/22 1222 '5\' 9"'$  (1.753 m)     Head Circumference --      Peak Flow --      Pain Score 02/26/22 1222 9     Pain Loc --      Pain Edu? --      Excl. in [place]? --     Most recent vital signs: Vitals:   02/26/22 1220  BP: (!) 152/90  Pulse: 84  Resp: 18  Temp: 97.8 F (36.6 C)  SpO2: 94%     General: Awake, no distress.  CV:  Good peripheral perfusion.  Resp:  Normal effort.  Abd:  No distention.  Soft and nontender Other:  Patient has a left inguinal hernia without any skin  changes overlying it.   ED Results / Procedures / Treatments   Labs (all labs ordered are listed, but only abnormal results are displayed) Labs Reviewed - No data to display    RADIOLOGY I have reviewed the CT personally and interpreted and patient has a left inguinal hernia  PROCEDURES:  Critical Care performed: No  Procedures   MEDICATIONS ORDERED IN ED: Medications  HYDROmorphone (DILAUDID) injection 0.5 mg (0.5 mg Intravenous Given 02/26/22 1322)  iohexol (OMNIPAQUE) 300 MG/ML solution 100 mL (100 mLs Intravenous Contrast Given 02/26/22 1352)     IMPRESSION / MDM / [place] / ED COURSE  I reviewed the triage vital signs and the nursing notes.   Patient's presentation is most consistent with acute presentation with potential threat to life or bodily function.   Patient comes in with concern for incarcerated hernia.  Doubt strangulated given no skin changes on top of it.  This is been out for over a week, no vomiting and still tolerating  p.o. and having bowel movements.  Will place ice packs and give some IV analgesia to see if we can try to push it back in.  We will keep n.p.o. in case patient needs surgical intervention.  Patient given some IV Dilaudid.  See BMP shows slightly elevated AST but has had that previously CBC normal white count.  After pain medications I again attempted reduction but was unable to reduce.  Will get CT scan and discuss with surgery  IMPRESSION: Moderate size left inguinal hernia is noted which contains a loop of sigmoid colon, but does not appear to be causing obstruction.   Hepatic steatosis.   Aortic Atherosclerosis (ICD10-I70.0).  Discussed the case with Dr. Christian Mate who came down to evaluate patient he was successfully able to reduce it.  He is going to stop follow him up outpatient for surgery and he will return to the ER if he develops recurrent herniation   Urine has slight amount of white cells in it.  Urine  culture was ordered.  Given he reports intermittent dysuria will cover for possible UTI with some Bactrim and we discussed needing prolonged treatment if this is not getting better in case this could be prostatitis   FINAL CLINICAL IMPRESSION(S) / ED DIAGNOSES   Final diagnoses:  Unilateral inguinal hernia without obstruction or gangrene, recurrence not specified     Rx / DC Orders   ED Discharge Orders     None        Note:  This document was prepared using Dragon voice recognition software and may include unintentional dictation errors.   [name], MD 02/26/22 1442    [name], MD 02/26/22 [phone]

## 2022-02-26 NOTE — ED Triage Notes (Signed)
C/o painful hernia in suprapubic area, pt. States he has known hernia there, it "popped out" this week, and he has been unable to reduce it. Pt. Has had hernia reduction previously. Pt. States pain increasing in severity. No meds today. Denies bowel symptoms. Pt. States painful urination.

## 2022-02-26 NOTE — Discharge Instructions (Addendum)
Surgery was able to get your hernia back in.  Try to avoid smoking and coughing as these can all cause the hernia to pop out.  If it does pop out again and you cannot get it back in then return to the ER for repeat evaluation otherwise he will schedule you surgery outpatient.  Return for fevers, redness of the skin, difficulty getting the hernia back and or any other concerns  IMPRESSION: Moderate size left inguinal hernia is noted which contains a loop of sigmoid colon, but does not appear to be causing obstruction.   Hepatic steatosis.   Aortic Atherosclerosis (ICD10-I70.0).

## 2022-02-28 LAB — URINE CULTURE: Culture: >=100000 — AB

## 2022-03-06 ENCOUNTER — Encounter: Payer: Self-pay | Admitting: Surgery

## 2022-03-06 ENCOUNTER — Other Ambulatory Visit: Payer: Self-pay

## 2022-03-06 ENCOUNTER — Ambulatory Visit: Payer: Self-pay | Admitting: Surgery

## 2022-03-06 ENCOUNTER — Ambulatory Visit (INDEPENDENT_AMBULATORY_CARE_PROVIDER_SITE_OTHER): Payer: Self-pay | Admitting: Surgery

## 2022-03-06 VITALS — BP 131/83 | HR 76 | Temp 98.5°F | Ht 69.0 in | Wt 142.0 lb

## 2022-03-06 DIAGNOSIS — K4091 Unilateral inguinal hernia, without obstruction or gangrene, recurrent: Secondary | ICD-10-CM

## 2022-03-06 NOTE — Progress Notes (Signed)
Patient ID: Edward Macias, male   DOB: 07-01-1960, 61 y.o.   MRN: [id number]  Chief Complaint: Recurrent left inguinal hernia, recent incarceration with ED visit.  History of Present Illness Edward Macias is a 61 y.o. male with a prior history of left inguinal hernia repair in May 2018.  Primary repair with onlay mesh reinforcement.   Presented to the ED October 18 with incarceration.  Were able to reduce in the ER.  It still comes out but he is able to reduce it himself now.  He is otherwise quite active, denying any shortness of breath with significant physical activity.  Denies any history of chest pain, nausea or vomiting.  He still smokes, asked if he was ready to quit and seems as though he is.  Encouraged him to pursue this.  Past Medical History Past Medical History:  Diagnosis Date   Arthritis    Asthma    AS A CHILD-NO INHALERS   Asthma exacerbation 04/09/2019   Sepsis ([place]) 07/24/2018      Past Surgical History:  Procedure Laterality Date   INGUINAL HERNIA REPAIR Left 10/03/2016   Procedure: HERNIA REPAIR INGUINAL ADULT;  Surgeon: Leonie Green, MD;  Location: [facility] ORS;  Service: General;  Laterality: Left;   NO PAST SURGERIES      No Known Allergies  Current Outpatient Medications  Medication Sig Dispense Refill   gabapentin (NEURONTIN) 100 MG capsule Take 1 capsule by mouth 3 (three) times daily.     tiotropium (SPIRIVA) 18 MCG inhalation capsule Place 1 capsule (18 mcg total) into inhaler and inhale daily. 90 capsule 0   No current facility-administered medications for this visit.    Family History History reviewed. No pertinent family history.    Social History Social History   Tobacco Use   Smoking status: Every Day    Packs/day: 1.00    Years: 39.00    Total pack years: 39.00    Types: Cigarettes   Smokeless tobacco: Never  Vaping Use   Vaping Use: Never used  Substance Use Topics   Alcohol use: Yes    Comment: occasional   Drug use: No         Review of Systems  Constitutional: Negative.   HENT: Negative.    Eyes: Negative.   Respiratory:  Positive for cough.   Cardiovascular: Negative.   Gastrointestinal: Negative.   Genitourinary: Negative.   Skin: Negative.   Neurological: Negative.   Psychiatric/Behavioral: Negative.        Physical Exam Blood pressure 131/83, pulse 76, temperature 98.5 F (36.9 C), temperature source Oral, height '5\' 9"'$  (1.753 m), weight 142 lb (64.4 kg), SpO2 96 %. Last Weight  Most recent update: 03/06/2022  1:59 PM    Weight  64.4 kg (142 lb)             CONSTITUTIONAL: Well developed, and nourished, appropriately responsive and aware without distress.   EYES: Sclera non-icteric.   EARS, NOSE, MOUTH AND THROAT:  The oropharynx is clear. Oral mucosa is pink and moist.   Hearing is intact to voice.  NECK: Trachea is midline, and there is no jugular venous distension.  LYMPH NODES:  Lymph nodes in the neck are not enlarged. RESPIRATORY:  Lungs are clear, and breath sounds are equal bilaterally. Normal respiratory effort without pathologic use of accessory muscles. CARDIOVASCULAR: Heart is regular in rate and rhythm. GI: The abdomen is soft, nontender, and nondistended. There were no palpable masses. I did not appreciate hepatosplenomegaly. There  were normal bowel sounds. GU: Readily apparent left inguinal hernia, reducible. MUSCULOSKELETAL:  Symmetrical muscle tone appreciated in all four extremities.    SKIN: Skin turgor is normal. No pathologic skin lesions appreciated.  NEUROLOGIC:  Motor and sensation appear grossly normal.  Cranial nerves are grossly without defect. PSYCH:  Alert and oriented to person, place and time. Affect is appropriate for situation.  Data Reviewed I have personally reviewed what is currently available of the patient's imaging, recent labs and medical records.   Labs:     Latest Ref Rng & Units 02/26/2022    1:24 PM 12/07/2019   12:12 PM 04/10/2019    5:45  AM  CBC  WBC 4.0 - 10.5 K/uL 8.3  10.6  16.9   Hemoglobin 13.0 - 17.0 g/dL 15.0  13.5  15.0   Hematocrit 39.0 - 52.0 % 45.3  38.2  45.2   Platelets 150 - 400 K/uL 242  263  238       Latest Ref Rng & Units 02/26/2022    1:24 PM 12/07/2019   12:12 PM 04/10/2019    5:45 AM  CMP  Glucose 70 - 99 mg/dL 79  128  148   BUN 8 - 23 mg/dL '16  13  14   '$ Creatinine 0.61 - 1.24 mg/dL 0.68  0.90  0.86   Sodium 135 - 145 mmol/L 137  140  139   Potassium 3.5 - 5.1 mmol/L 4.3  4.2  4.3   Chloride 98 - 111 mmol/L 102  104  100   CO2 22 - 32 mmol/L '24  22  28   '$ Calcium 8.9 - 10.3 mg/dL 9.0  9.8  10.1   Total Protein 6.5 - 8.1 g/dL 7.3  7.3  7.9   Total Bilirubin 0.3 - 1.2 mg/dL 0.9  0.6  0.6   Alkaline Phos 38 - 126 U/L 53  62  54   AST 15 - 41 U/L 73  31  23   ALT 0 - 44 U/L 42  31  23     Imaging: Radiological images reviewed:  CLINICAL DATA:  Acute left lower quadrant abdominal pain.   EXAM: CT ABDOMEN AND PELVIS WITH CONTRAST   TECHNIQUE: Multidetector CT imaging of the abdomen and pelvis was performed using the standard protocol following bolus administration of intravenous contrast.   RADIATION DOSE REDUCTION: This exam was performed according to the departmental dose-optimization program which includes automated exposure control, adjustment of the mA and/or kV according to patient size and/or use of iterative reconstruction technique.   CONTRAST:  192m OMNIPAQUE IOHEXOL 300 MG/ML  SOLN   COMPARISON:  None available currently.   FINDINGS: Lower chest: No acute abnormality.   Hepatobiliary: No gallstones or biliary dilatation is noted. Hepatic steatosis.   Pancreas: Unremarkable. No pancreatic ductal dilatation or surrounding inflammatory changes.   Spleen: Normal in size without focal abnormality.   Adrenals/Urinary Tract: Adrenal glands appear normal. No hydronephrosis or renal obstruction is noted. No renal or ureteral calculi are noted. Urinary bladder is  unremarkable.   Stomach/Bowel: The stomach and appendix are unremarkable. Stool is noted throughout the colon. There is no evidence of bowel obstruction or inflammation. Moderate size left inguinal hernia is noted which contains a loop of sigmoid colon.   Vascular/Lymphatic: Aortic atherosclerosis. No enlarged abdominal or pelvic lymph nodes.   Reproductive: Prostate is unremarkable.   Other: No abdominal wall hernia or abnormality. No abdominopelvic ascites.   Musculoskeletal: No acute or significant osseous  findings.   IMPRESSION: Moderate size left inguinal hernia is noted which contains a loop of sigmoid colon, but does not appear to be causing obstruction.   Hepatic steatosis.   Aortic Atherosclerosis (ICD10-I70.0).     Electronically Signed   By: Marijo Conception M.D.   On: 02/26/2022 14:07 Within last 24 hrs: No results found.  Assessment    Recurrent left inguinal hernia. Patient Active Problem List   Diagnosis Date Noted   COPD (chronic obstructive pulmonary disease) ([place]) 04/17/2020   Tobacco abuse 04/17/2020   History of prediabetes 04/17/2020   Elevated lipids 12/13/2019   Encounter to establish care 11/24/2019   History of asthma 11/24/2019   Chronic right hip pain 11/24/2019   Peripheral neuropathy 04/10/2019   Wheezing 04/09/2019    Plan    Robotic repair of recurrent left inguinal hernia.  I discussed possibility of incarceration, strangulation, enlargement in size over time, and the need for emergency surgery in the face of these.  Also reviewed the techniques of reduction should incarceration occur, and when unsuccessful to present to the ED.  Also discussed that surgery risks include recurrence which can be up to 30% in the case of complex hernias, use of prosthetic materials (mesh) and the increased risk of infection and the possible need for re-operation and removal of mesh, possibility of post-op SBO or ileus, and the risks of general anesthetic  including heart attack, stroke, sudden death or some reaction to anesthetic medications. The patient, and those present, appear to understand the risks, any and all questions were answered to the patient's satisfaction.  No guarantees were ever expressed or implied.   Face-to-face time spent with the patient and accompanying care providers(if present) was 25 minutes, with more than 50% of the time spent counseling, educating, and coordinating care of the patient.    These notes generated with voice recognition software. I apologize for typographical errors.  Ronny Bacon M.D., FACS 03/06/2022, 2:18 PM

## 2022-03-06 NOTE — H&P (View-Only) (Signed)
Patient ID: Edward Macias, male   DOB: [birth year], 61 y.o.   MRN: [id number]  Chief Complaint: Recurrent left inguinal hernia, recent incarceration with ED visit.  History of Present Illness Edward Macias is a 61 y.o. male with a prior history of left inguinal hernia repair in May 2018.  Primary repair with onlay mesh reinforcement.   Presented to the ED October 18 with incarceration.  Were able to reduce in the ER.  It still comes out but he is able to reduce it himself now.  He is otherwise quite active, denying any shortness of breath with significant physical activity.  Denies any history of chest pain, nausea or vomiting.  He still smokes, asked if he was ready to quit and seems as though he is.  Encouraged him to pursue this.  Past Medical History Past Medical History:  Diagnosis Date   Arthritis    Asthma    AS A CHILD-NO INHALERS   Asthma exacerbation 04/09/2019   Sepsis ([place]) 07/24/2018      Past Surgical History:  Procedure Laterality Date   INGUINAL HERNIA REPAIR Left 10/03/2016   Procedure: HERNIA REPAIR INGUINAL ADULT;  Surgeon: Leonie Green, MD;  Location: [facility] ORS;  Service: General;  Laterality: Left;   NO PAST SURGERIES      No Known Allergies  Current Outpatient Medications  Medication Sig Dispense Refill   gabapentin (NEURONTIN) 100 MG capsule Take 1 capsule by mouth 3 (three) times daily.     tiotropium (SPIRIVA) 18 MCG inhalation capsule Place 1 capsule (18 mcg total) into inhaler and inhale daily. 90 capsule 0   No current facility-administered medications for this visit.    Family History History reviewed. No pertinent family history.    Social History Social History   Tobacco Use   Smoking status: Every Day    Packs/day: 1.00    Years: 39.00    Total pack years: 39.00    Types: Cigarettes   Smokeless tobacco: Never  Vaping Use   Vaping Use: Never used  Substance Use Topics   Alcohol use: Yes    Comment: occasional   Drug use: No         Review of Systems  Constitutional: Negative.   HENT: Negative.    Eyes: Negative.   Respiratory:  Positive for cough.   Cardiovascular: Negative.   Gastrointestinal: Negative.   Genitourinary: Negative.   Skin: Negative.   Neurological: Negative.   Psychiatric/Behavioral: Negative.        Physical Exam Blood pressure 131/83, pulse 76, temperature 98.5 F (36.9 C), temperature source Oral, height '5\' 9"'$  (1.753 m), weight 142 lb (64.4 kg), SpO2 96 %. Last Weight  Most recent update: 03/06/2022  1:59 PM    Weight  64.4 kg (142 lb)             CONSTITUTIONAL: Well developed, and nourished, appropriately responsive and aware without distress.   EYES: Sclera non-icteric.   EARS, NOSE, MOUTH AND THROAT:  The oropharynx is clear. Oral mucosa is pink and moist.   Hearing is intact to voice.  NECK: Trachea is midline, and there is no jugular venous distension.  LYMPH NODES:  Lymph nodes in the neck are not enlarged. RESPIRATORY:  Lungs are clear, and breath sounds are equal bilaterally. Normal respiratory effort without pathologic use of accessory muscles. CARDIOVASCULAR: Heart is regular in rate and rhythm. GI: The abdomen is soft, nontender, and nondistended. There were no palpable masses. I did not appreciate hepatosplenomegaly. There  were normal bowel sounds. GU: Readily apparent left inguinal hernia, reducible. MUSCULOSKELETAL:  Symmetrical muscle tone appreciated in all four extremities.    SKIN: Skin turgor is normal. No pathologic skin lesions appreciated.  NEUROLOGIC:  Motor and sensation appear grossly normal.  Cranial nerves are grossly without defect. PSYCH:  Alert and oriented to person, place and time. Affect is appropriate for situation.  Data Reviewed I have personally reviewed what is currently available of the patient's imaging, recent labs and medical records.   Labs:     Latest Ref Rng & Units 02/26/2022    1:24 PM 12/07/2019   12:12 PM 04/10/2019    5:45  AM  CBC  WBC 4.0 - 10.5 K/uL 8.3  10.6  16.9   Hemoglobin 13.0 - 17.0 g/dL 15.0  13.5  15.0   Hematocrit 39.0 - 52.0 % 45.3  38.2  45.2   Platelets 150 - 400 K/uL 242  263  238       Latest Ref Rng & Units 02/26/2022    1:24 PM 12/07/2019   12:12 PM 04/10/2019    5:45 AM  CMP  Glucose 70 - 99 mg/dL 79  128  148   BUN 8 - 23 mg/dL '16  13  14   '$ Creatinine 0.61 - 1.24 mg/dL 0.68  0.90  0.86   Sodium 135 - 145 mmol/L 137  140  139   Potassium 3.5 - 5.1 mmol/L 4.3  4.2  4.3   Chloride 98 - 111 mmol/L 102  104  100   CO2 22 - 32 mmol/L '24  22  28   '$ Calcium 8.9 - 10.3 mg/dL 9.0  9.8  10.1   Total Protein 6.5 - 8.1 g/dL 7.3  7.3  7.9   Total Bilirubin 0.3 - 1.2 mg/dL 0.9  0.6  0.6   Alkaline Phos 38 - 126 U/L 53  62  54   AST 15 - 41 U/L 73  31  23   ALT 0 - 44 U/L 42  31  23     Imaging: Radiological images reviewed:  CLINICAL DATA:  Acute left lower quadrant abdominal pain.   EXAM: CT ABDOMEN AND PELVIS WITH CONTRAST   TECHNIQUE: Multidetector CT imaging of the abdomen and pelvis was performed using the standard protocol following bolus administration of intravenous contrast.   RADIATION DOSE REDUCTION: This exam was performed according to the departmental dose-optimization program which includes automated exposure control, adjustment of the mA and/or kV according to patient size and/or use of iterative reconstruction technique.   CONTRAST:  126m OMNIPAQUE IOHEXOL 300 MG/ML  SOLN   COMPARISON:  None available currently.   FINDINGS: Lower chest: No acute abnormality.   Hepatobiliary: No gallstones or biliary dilatation is noted. Hepatic steatosis.   Pancreas: Unremarkable. No pancreatic ductal dilatation or surrounding inflammatory changes.   Spleen: Normal in size without focal abnormality.   Adrenals/Urinary Tract: Adrenal glands appear normal. No hydronephrosis or renal obstruction is noted. No renal or ureteral calculi are noted. Urinary bladder is  unremarkable.   Stomach/Bowel: The stomach and appendix are unremarkable. Stool is noted throughout the colon. There is no evidence of bowel obstruction or inflammation. Moderate size left inguinal hernia is noted which contains a loop of sigmoid colon.   Vascular/Lymphatic: Aortic atherosclerosis. No enlarged abdominal or pelvic lymph nodes.   Reproductive: Prostate is unremarkable.   Other: No abdominal wall hernia or abnormality. No abdominopelvic ascites.   Musculoskeletal: No acute or significant osseous  findings.   IMPRESSION: Moderate size left inguinal hernia is noted which contains a loop of sigmoid colon, but does not appear to be causing obstruction.   Hepatic steatosis.   Aortic Atherosclerosis (ICD10-I70.0).     Electronically Signed   By: Marijo Conception M.D.   On: 02/26/2022 14:07 Within last 24 hrs: No results found.  Assessment    Recurrent left inguinal hernia. Patient Active Problem List   Diagnosis Date Noted   COPD (chronic obstructive pulmonary disease) ([place]) 04/17/2020   Tobacco abuse 04/17/2020   History of prediabetes 04/17/2020   Elevated lipids 12/13/2019   Encounter to establish care 11/24/2019   History of asthma 11/24/2019   Chronic right hip pain 11/24/2019   Peripheral neuropathy 04/10/2019   Wheezing 04/09/2019    Plan    Robotic repair of recurrent left inguinal hernia.  I discussed possibility of incarceration, strangulation, enlargement in size over time, and the need for emergency surgery in the face of these.  Also reviewed the techniques of reduction should incarceration occur, and when unsuccessful to present to the ED.  Also discussed that surgery risks include recurrence which can be up to 30% in the case of complex hernias, use of prosthetic materials (mesh) and the increased risk of infection and the possible need for re-operation and removal of mesh, possibility of post-op SBO or ileus, and the risks of general anesthetic  including heart attack, stroke, sudden death or some reaction to anesthetic medications. The patient, and those present, appear to understand the risks, any and all questions were answered to the patient's satisfaction.  No guarantees were ever expressed or implied.   Face-to-face time spent with the patient and accompanying care providers(if present) was 25 minutes, with more than 50% of the time spent counseling, educating, and coordinating care of the patient.    These notes generated with voice recognition software. I apologize for typographical errors.  Ronny Bacon M.D., FACS 03/06/2022, 2:18 PM

## 2022-03-06 NOTE — Patient Instructions (Addendum)
Please try to stop smoking.  Our surgery scheduler will call you within 24-48 hours to schedule your surgery. Please have the [place] surgery sheet available when speaking with her.    Inguinal Hernia, Adult An inguinal hernia develops when fat or the intestines push through a weak spot in a muscle where the leg meets the lower abdomen (groin). This creates a bulge. This kind of hernia could also be: In the scrotum, if you are male. In folds of skin around the vagina, if you are male. There are three types of inguinal hernias: Hernias that can be pushed back into the abdomen (are reducible). This type rarely causes pain. Hernias that are not reducible (are incarcerated). Hernias that are not reducible and lose their blood supply (are strangulated). This type of hernia requires emergency surgery. What are the causes? This condition is caused by having a weak spot in the muscles or tissues in your groin. This develops over time. The hernia may poke through the weak spot when you suddenly strain your lower abdominal muscles, such as when you: Lift a heavy object. Strain to have a bowel movement. Constipation can lead to straining. Cough. What increases the risk? This condition is more likely to develop in: Males. Pregnant females. People who: Are overweight. Work in jobs that require long periods of standing or heavy lifting. Have had an inguinal hernia before. Smoke or have lung disease. These factors can lead to long-term (chronic) coughing. What are the signs or symptoms? Symptoms may depend on the size of the hernia. Often, a small inguinal hernia has no symptoms. Symptoms of a larger hernia may include: A bulge in the groin area. This is easier to see when standing. It might not be visible when lying down. Pain or burning in the groin. This may get worse when lifting, straining, or coughing. A dull ache or a feeling of pressure in the groin. An unusual bulge in the scrotum, in  males. Symptoms of a strangulated inguinal hernia may include: A bulge in your groin that is very painful and tender to the touch. A bulge that turns red or purple. Fever, nausea, and vomiting. Inability to have a bowel movement or to pass gas. How is this diagnosed? This condition is diagnosed based on your symptoms, your medical history, and a physical exam. Your health care provider may feel your groin area and ask you to cough. How is this treated? Treatment depends on the size of your hernia and whether you have symptoms. If you do not have symptoms, your health care provider may have you watch your hernia carefully and have you come in for follow-up visits. If your hernia is large or if you have symptoms, you may need surgery to repair the hernia. Follow these instructions at home: Lifestyle Avoid lifting heavy objects. Avoid standing for long periods of time. Do not use any products that contain nicotine or tobacco. These products include cigarettes, chewing tobacco, and vaping devices, such as e-cigarettes. If you need help quitting, ask your health care provider. Maintain a healthy weight. Preventing constipation You may need to take these actions to prevent or treat constipation: Drink enough fluid to keep your urine pale yellow. Take over-the-counter or prescription medicines. Eat foods that are high in fiber, such as beans, whole grains, and fresh fruits and vegetables. Limit foods that are high in fat and processed sugars, such as fried or sweet foods. General instructions You may try to push the hernia back in place by very  gently pressing on it while lying down. Do not try to force the bulge back in if it will not push in easily. Watch your hernia for any changes in shape, size, or color. Get help right away if you notice any changes. Take over-the-counter and prescription medicines only as told by your health care provider. Keep all follow-up visits. This is  important. Contact a health care provider if: You have a fever or chills. You develop new symptoms. Your symptoms get worse. Get help right away if: You have pain in your groin that suddenly gets worse. You have a bulge in your groin that: Suddenly gets bigger and does not get smaller. Becomes red or purple or painful to the touch. You are a man and you have a sudden pain in your scrotum, or the size of your scrotum suddenly changes. You cannot push the hernia back in place by very gently pressing on it when you are lying down. You have nausea or vomiting that does not go away. You have a fast heartbeat. You cannot have a bowel movement or pass gas. These symptoms may represent a serious problem that is an emergency. Do not wait to see if the symptoms will go away. Get medical help right away. Call your local emergency services (911 in the [place] Summary An inguinal hernia develops when fat or the intestines push through a weak spot in a muscle where your leg meets your lower abdomen (groin). This condition is caused by having a weak spot in muscles or tissues in your groin. Symptoms may depend on the size of the hernia, and they may include pain or swelling in your groin. A small inguinal hernia often has no symptoms. Treatment may not be needed if you do not have symptoms. If you have symptoms or a large hernia, you may need surgery to repair the hernia. Avoid lifting heavy objects. Also, avoid standing for long periods of time. This information is not intended to replace advice given to you by your health care provider. Make sure you discuss any questions you have with your health care provider. Document Revised: 12/27/2019 Document Reviewed: 12/27/2019 Elsevier Patient Education  [place].

## 2022-03-07 ENCOUNTER — Telehealth: Payer: Self-pay | Admitting: Surgery

## 2022-03-07 NOTE — Telephone Encounter (Signed)
Patient has been advised of Pre-Admission date/time, and Surgery date at [facility].  Surgery Date: 03/19/22 Preadmission Testing Date: 03/12/22 (phone 8a-1p)  Patient has been made aware to call [phone], between 1-3:00pm the day before surgery, to find out what time to arrive for surgery.

## 2022-03-12 ENCOUNTER — Other Ambulatory Visit: Payer: Self-pay

## 2022-03-12 ENCOUNTER — Encounter
Admission: RE | Admit: 2022-03-12 | Discharge: 2022-03-12 | Disposition: A | Discharge status: Home / Self Care | Payer: Self-pay | Source: Ambulatory Visit | Attending: Surgery | Admitting: Surgery

## 2022-03-12 HISTORY — DX: Pneumonia, unspecified organism: J18.9

## 2022-03-12 HISTORY — DX: Chronic obstructive pulmonary disease, unspecified: J44.9

## 2022-03-12 NOTE — Patient Instructions (Signed)
Your procedure is scheduled on: 03/19/22 Report to [facility]. To find out your arrival time please call [phone] between 1PM - 3PM on 03/18/22.  Remember: Instructions that are not followed completely may result in serious medical risk, up to and including death, or upon the discretion of your surgeon and anesthesiologist your surgery may need to be rescheduled.     _X__ 1. Do not eat food or drink any liquids after midnight the night before your procedure.                 No gum chewing or hard candies.   __X__2.  On the morning of surgery brush your teeth with toothpaste and water, you                 may rinse your mouth with mouthwash if you wish.  Do not swallow any              toothpaste of mouthwash.     _X__ 3.  No Alcohol for 24 hours before or after surgery.   _X__ 4.  Do Not Smoke or use e-cigarettes For 24 Hours Prior to Your Surgery.                 Do not use any chewable tobacco products for at least 6 hours prior to                 surgery.  ____  5.  Bring all medications with you on the day of surgery if instructed.   __X__  6.  Notify your doctor if there is any change in your medical condition      (cold, fever, infections).     Do not wear jewelry, make-up, hairpins, clips or nail polish. Do not wear lotions, powders, or perfumes. You may wear deodorant Do not shave body hair 48 hours prior to surgery. Men may shave face and neck. Do not bring valuables to the hospital.    [facility] is not responsible for any belongings or valuables.  Contacts, dentures/partials or body piercings may not be worn into surgery. Bring a case for your contacts, glasses or hearing aids, a denture cup will be supplied. Leave your suitcase in the car. After surgery it may be brought to your room. For patients admitted to the hospital, discharge time is determined by your treatment team.   Patients discharged the day of  surgery will not be allowed to drive home.    __X__ Take these medicines the morning of surgery with A SIP OF WATER:    1. none  2.   3.   4.  5.  6.  ____ Fleet Enema (as directed)   ____ Use CHG Soap/SAGE wipes as directed  __X__ Use inhalers on the day of surgery  ADVAIR  ____ Stop metformin/Janumet/Farxiga 2 days prior to surgery    ____ Take 1/2 of usual insulin dose the night before surgery. No insulin the morning          of surgery.   ____ Stop Blood Thinners Coumadin/Plavix/Xarelto/Pleta/Pradaxa/Eliquis/Effient/Aspirin  on   Or contact your Surgeon, Cardiologist or Medical Doctor regarding  ability to stop your blood thinners  __X__ Stop Anti-inflammatories 7 days before surgery such as Advil, Ibuprofen, Motrin,  BC or Goodies Powder, Naprosyn, Naproxen, Aleve, Aspirin   You may use Tylenol if needed  __X__ Stop all herbals and supplements, fish oil or vitamins for 7  days until after surgery.    ____ Bring C-Pap to the hospital.

## 2022-03-18 MED ORDER — BUPIVACAINE LIPOSOME 1.3 % IJ SUSP: 20.0000 mL | Freq: Once | INTRAMUSCULAR | Status: DC

## 2022-03-18 MED ORDER — CHLORHEXIDINE GLUCONATE CLOTH 2 % EX PADS: 6.0000 | MEDICATED_PAD | Freq: Once | CUTANEOUS | Status: DC

## 2022-03-18 MED ORDER — LACTATED RINGERS IV SOLN: INTRAVENOUS | Status: DC

## 2022-03-18 MED ORDER — CELECOXIB 200 MG PO CAPS: 200.0000 mg | ORAL_CAPSULE | ORAL | Status: AC

## 2022-03-18 MED ORDER — CHLORHEXIDINE GLUCONATE 0.12 % MT SOLN: 15.0000 mL | Freq: Once | OROMUCOSAL | Status: AC

## 2022-03-18 MED ORDER — CEFAZOLIN SODIUM-DEXTROSE 2-4 GM/100ML-% IV SOLN
2.0000 g | INTRAVENOUS | Status: AC
  Administered 2022-03-19: 2 g via INTRAVENOUS

## 2022-03-18 MED ORDER — GABAPENTIN 300 MG PO CAPS: 300.0000 mg | ORAL_CAPSULE | ORAL | Status: AC

## 2022-03-18 MED ORDER — FAMOTIDINE 20 MG PO TABS: 20.0000 mg | ORAL_TABLET | Freq: Once | ORAL | Status: AC

## 2022-03-18 MED ORDER — ACETAMINOPHEN 500 MG PO TABS: 1000.0000 mg | ORAL_TABLET | ORAL | Status: AC

## 2022-03-18 MED ORDER — ORAL CARE MOUTH RINSE: 15.0000 mL | Freq: Once | OROMUCOSAL | Status: AC

## 2022-03-19 ENCOUNTER — Encounter
Admission: RE | Disposition: A | Discharge status: Home / Self Care | Payer: Self-pay | Source: Home / Self Care | Attending: Surgery

## 2022-03-19 ENCOUNTER — Other Ambulatory Visit: Payer: Self-pay

## 2022-03-19 ENCOUNTER — Ambulatory Visit
Admission: RE | Admit: 2022-03-19 | Discharge: 2022-03-19 | Disposition: A | Discharge status: Home / Self Care | Payer: 59 | Attending: Surgery | Admitting: Surgery

## 2022-03-19 ENCOUNTER — Ambulatory Visit: Payer: 59 | Admitting: General Practice

## 2022-03-19 ENCOUNTER — Encounter: Payer: Self-pay | Admitting: Surgery

## 2022-03-19 DIAGNOSIS — K4091 Unilateral inguinal hernia, without obstruction or gangrene, recurrent: Secondary | ICD-10-CM | POA: Diagnosis present

## 2022-03-19 DIAGNOSIS — K4090 Unilateral inguinal hernia, without obstruction or gangrene, not specified as recurrent: Secondary | ICD-10-CM

## 2022-03-19 HISTORY — PX: INSERTION OF MESH: SHX5868

## 2022-03-19 SURGERY — HERNIORRHAPHY, INGUINAL, ROBOT-ASSISTED, LAPAROSCOPIC
Anesthesia: General | Site: Inguinal | Laterality: Bilateral

## 2022-03-19 MED ORDER — DEXAMETHASONE SODIUM PHOSPHATE 10 MG/ML IJ SOLN
INTRAMUSCULAR | Status: DC | PRN
  Administered 2022-03-19: 10 mg via INTRAVENOUS

## 2022-03-19 MED ORDER — PROPOFOL 10 MG/ML IV BOLUS
INTRAVENOUS | Status: DC | PRN
  Administered 2022-03-19: 200 mg via INTRAVENOUS

## 2022-03-19 MED ORDER — HYDROMORPHONE HCL 1 MG/ML IJ SOLN
INTRAMUSCULAR | Status: AC
  Filled 2022-03-19: qty 1

## 2022-03-19 MED ORDER — ACETAMINOPHEN 500 MG PO TABS
ORAL_TABLET | ORAL | Status: AC
  Administered 2022-03-19: 1000 mg via ORAL
  Filled 2022-03-19: qty 2

## 2022-03-19 MED ORDER — LIDOCAINE HCL (CARDIAC) PF 100 MG/5ML IV SOSY
PREFILLED_SYRINGE | INTRAVENOUS | Status: DC | PRN
  Administered 2022-03-19: 100 mg via INTRAVENOUS

## 2022-03-19 MED ORDER — CHLORHEXIDINE GLUCONATE 0.12 % MT SOLN
OROMUCOSAL | Status: AC
  Administered 2022-03-19: 15 mL via OROMUCOSAL
  Filled 2022-03-19: qty 15

## 2022-03-19 MED ORDER — PHENYLEPHRINE 80 MCG/ML (10ML) SYRINGE FOR IV PUSH (FOR BLOOD PRESSURE SUPPORT)
PREFILLED_SYRINGE | INTRAVENOUS | Status: DC | PRN
  Administered 2022-03-19 (×2): 160 ug via INTRAVENOUS

## 2022-03-19 MED ORDER — OXYCODONE HCL 5 MG/5ML PO SOLN: 5.0000 mg | Freq: Once | ORAL | Status: AC | PRN

## 2022-03-19 MED ORDER — HYDROCODONE-ACETAMINOPHEN 5-325 MG PO TABS: 1.0000 | ORAL_TABLET | Freq: Four times a day (QID) | ORAL | 0 refills | Status: DC | PRN

## 2022-03-19 MED ORDER — MIDAZOLAM HCL 2 MG/2ML IJ SOLN
INTRAMUSCULAR | Status: AC
  Filled 2022-03-19: qty 2

## 2022-03-19 MED ORDER — GABAPENTIN 300 MG PO CAPS
ORAL_CAPSULE | ORAL | Status: AC
  Administered 2022-03-19: 300 mg via ORAL
  Filled 2022-03-19: qty 1

## 2022-03-19 MED ORDER — MIDAZOLAM HCL 2 MG/2ML IJ SOLN
INTRAMUSCULAR | Status: DC | PRN
  Administered 2022-03-19: 2 mg via INTRAVENOUS

## 2022-03-19 MED ORDER — FENTANYL CITRATE (PF) 100 MCG/2ML IJ SOLN
INTRAMUSCULAR | Status: DC | PRN
  Administered 2022-03-19 (×2): 50 ug via INTRAVENOUS

## 2022-03-19 MED ORDER — SUGAMMADEX SODIUM 500 MG/5ML IV SOLN
INTRAVENOUS | Status: DC | PRN
  Administered 2022-03-19: 400 mg via INTRAVENOUS

## 2022-03-19 MED ORDER — FENTANYL CITRATE (PF) 100 MCG/2ML IJ SOLN
INTRAMUSCULAR | Status: AC
  Filled 2022-03-19: qty 2

## 2022-03-19 MED ORDER — ROCURONIUM BROMIDE 100 MG/10ML IV SOLN
INTRAVENOUS | Status: DC | PRN
  Administered 2022-03-19: 60 mg via INTRAVENOUS
  Administered 2022-03-19: 30 mg via INTRAVENOUS

## 2022-03-19 MED ORDER — IBUPROFEN 800 MG PO TABS: 800.0000 mg | ORAL_TABLET | Freq: Three times a day (TID) | ORAL | 0 refills | Status: DC | PRN

## 2022-03-19 MED ORDER — ONDANSETRON HCL 4 MG/2ML IJ SOLN
INTRAMUSCULAR | Status: DC | PRN
  Administered 2022-03-19 (×2): 4 mg via INTRAVENOUS

## 2022-03-19 MED ORDER — HYDROMORPHONE HCL 1 MG/ML IJ SOLN
INTRAMUSCULAR | Status: DC | PRN
  Administered 2022-03-19: 1 mg via INTRAVENOUS

## 2022-03-19 MED ORDER — PHENYLEPHRINE HCL-NACL 20-0.9 MG/250ML-% IV SOLN
INTRAVENOUS | Status: AC
  Filled 2022-03-19: qty 250

## 2022-03-19 MED ORDER — BUPIVACAINE-EPINEPHRINE (PF) 0.25% -1:200000 IJ SOLN
INTRAMUSCULAR | Status: DC | PRN
  Administered 2022-03-19: 12 mL
  Administered 2022-03-19: 18 mL

## 2022-03-19 MED ORDER — OXYCODONE HCL 5 MG PO TABS
5.0000 mg | ORAL_TABLET | Freq: Once | ORAL | Status: AC | PRN
  Administered 2022-03-19: 5 mg via ORAL

## 2022-03-19 MED ORDER — BUPIVACAINE-EPINEPHRINE (PF) 0.25% -1:200000 IJ SOLN
INTRAMUSCULAR | Status: AC
  Filled 2022-03-19: qty 30

## 2022-03-19 MED ORDER — CEFAZOLIN SODIUM-DEXTROSE 2-4 GM/100ML-% IV SOLN
INTRAVENOUS | Status: AC
  Filled 2022-03-19: qty 100

## 2022-03-19 MED ORDER — FAMOTIDINE 20 MG PO TABS
ORAL_TABLET | ORAL | Status: AC
  Administered 2022-03-19: 20 mg via ORAL
  Filled 2022-03-19: qty 1

## 2022-03-19 MED ORDER — LACTATED RINGERS IV SOLN: INTRAVENOUS | Status: DC | PRN

## 2022-03-19 MED ORDER — SEVOFLURANE IN SOLN
RESPIRATORY_TRACT | Status: AC
  Filled 2022-03-19: qty 250

## 2022-03-19 MED ORDER — GLYCOPYRROLATE 0.2 MG/ML IJ SOLN
INTRAMUSCULAR | Status: DC | PRN
  Administered 2022-03-19: .2 mg via INTRAVENOUS

## 2022-03-19 MED ORDER — CELECOXIB 200 MG PO CAPS
ORAL_CAPSULE | ORAL | Status: AC
  Administered 2022-03-19: 200 mg via ORAL
  Filled 2022-03-19: qty 1

## 2022-03-19 MED ORDER — PHENYLEPHRINE HCL-NACL 20-0.9 MG/250ML-% IV SOLN
INTRAVENOUS | Status: DC | PRN
  Administered 2022-03-19: 25 ug/min via INTRAVENOUS

## 2022-03-19 MED ORDER — FENTANYL CITRATE (PF) 100 MCG/2ML IJ SOLN: 25.0000 ug | INTRAMUSCULAR | Status: DC | PRN

## 2022-03-19 MED ORDER — OXYCODONE HCL 5 MG PO TABS
ORAL_TABLET | ORAL | Status: AC
  Filled 2022-03-19: qty 1

## 2022-03-19 SURGICAL SUPPLY — 40 items
ADH SKN CLS APL DERMABOND .7 (GAUZE/BANDAGES/DRESSINGS) ×4
BLADE CLIPPER SURG (BLADE) ×2 IMPLANT
COVER TIP SHEARS 8 DVNC (MISCELLANEOUS) ×2 IMPLANT
COVER TIP SHEARS 8MM DA VINCI (MISCELLANEOUS) ×2
COVER WAND RF STERILE (DRAPES) ×2 IMPLANT
DERMABOND ADVANCED .7 DNX12 (GAUZE/BANDAGES/DRESSINGS) ×2 IMPLANT
DRAPE ARM DVNC X/XI (DISPOSABLE) ×6 IMPLANT
DRAPE COLUMN DVNC XI (DISPOSABLE) ×2 IMPLANT
DRAPE DA VINCI XI ARM (DISPOSABLE) ×6
DRAPE DA VINCI XI COLUMN (DISPOSABLE) ×2
ELECT REM PT RETURN 9FT ADLT (ELECTROSURGICAL) ×2
ELECTRODE REM PT RTRN 9FT ADLT (ELECTROSURGICAL) ×2 IMPLANT
GLOVE ORTHO TXT STRL SZ7.5 (GLOVE) ×6 IMPLANT
GOWN STRL REUS W/ TWL LRG LVL3 (GOWN DISPOSABLE) ×2 IMPLANT
GOWN STRL REUS W/ TWL XL LVL3 (GOWN DISPOSABLE) ×4 IMPLANT
GOWN STRL REUS W/TWL LRG LVL3 (GOWN DISPOSABLE) ×2
GOWN STRL REUS W/TWL XL LVL3 (GOWN DISPOSABLE) ×4
KIT PINK PAD W/HEAD ARE REST (MISCELLANEOUS) ×2
KIT PINK PAD W/HEAD ARM REST (MISCELLANEOUS) ×2 IMPLANT
LABEL OR SOLS (LABEL) ×2 IMPLANT
MANIFOLD NEPTUNE II (INSTRUMENTS) ×2 IMPLANT
MESH 3DMAX LIGHT 4.1X6.2 LT LR (Mesh General) IMPLANT
MESH 3DMAX LIGHT 4.1X6.2 RT LR (Mesh General) IMPLANT
NDL INSUFFLATION 14GA 120MM (NEEDLE) IMPLANT
NEEDLE HYPO 22GX1.5 SAFETY (NEEDLE) ×2 IMPLANT
NEEDLE INSUFFLATION 14GA 120MM (NEEDLE) ×2 IMPLANT
PACK LAP CHOLECYSTECTOMY (MISCELLANEOUS) ×2 IMPLANT
SEAL CANN UNIV 5-8 DVNC XI (MISCELLANEOUS) ×6 IMPLANT
SEAL XI 5MM-8MM UNIVERSAL (MISCELLANEOUS) ×6
SET TUBE SMOKE EVAC HIGH FLOW (TUBING) ×2 IMPLANT
SOLUTION ELECTROLUBE (MISCELLANEOUS) ×2 IMPLANT
SUT MNCRL 4-0 (SUTURE) ×2
SUT MNCRL 4-0 27XMFL (SUTURE) ×2
SUT VIC AB 0 CT2 27 (SUTURE) ×2 IMPLANT
SUT VIC AB 2-0 RB1 27 (SUTURE) ×2 IMPLANT
SUT VLOC 90 2/L VL 12 GS22 (SUTURE) IMPLANT
SUT VLOC 90 S/L VL9 GS22 (SUTURE) IMPLANT
SUTURE MNCRL 4-0 27XMF (SUTURE) ×2 IMPLANT
TRAP FLUID SMOKE EVACUATOR (MISCELLANEOUS) ×2 IMPLANT
WATER STERILE IRR 500ML POUR (IV SOLUTION) ×2 IMPLANT

## 2022-03-19 NOTE — Op Note (Signed)
Robotic assisted Laparoscopic Transabdominal bilateral inguinal Hernia Repair with Mesh (recurrent left inguinal hernia, right direct)       Pre-operative Diagnosis: Left recurrent inguinal Hernia, right inguinal hernia.   Post-operative Diagnosis: Same   Procedure: Robotic assisted Laparoscopic  repair of left recurrent, and right inguinal hernia(s)   Surgeon: Ronny Bacon, M.D., FACS   Anesthesia: GETA   Findings: Left recurrent inguinal hernia, with scarring and adjacent sigmoid as expected, right direct inguinal hernia.         Procedure Details  The patient was seen again in the Holding Room. The benefits, complications, treatment options, and expected outcomes were discussed with the patient. The risks of bleeding, infection, recurrence of symptoms, failure to resolve symptoms, recurrence of hernia, ischemic orchitis, chronic pain syndrome or neuroma, were reviewed again. The likelihood of improving the patient's symptoms with return to their baseline status is good.  The patient and/or family concurred with the proposed plan, giving informed consent.  The patient was taken to Operating Room, identified  and the procedure verified as Laparoscopic Inguinal Hernia Repair. Laterality confirmed.  A Time Out was held and the above information confirmed.   Prior to the induction of general anesthesia, antibiotic prophylaxis was administered. VTE prophylaxis was in place. General endotracheal anesthesia was then administered and tolerated well. After the induction, the abdomen was prepped with Chloraprep and draped in the sterile fashion. The patient was positioned in the supine position.   After local infiltration of quarter percent Marcaine with epinephrine, stab incision was made left upper quadrant.  On the left at Palmer's point, the Veress needle is passed with sensation of the layers to penetrate the abdominal wall and into the peritoneum.  Saline drop test is confirmed peritoneal  placement.  Insufflation is initiated with carbon dioxide to pressures of 15 mmHg. An 8.5 mm port is placed to the left off of the midline, with blunt tipped trocar.  Pneumoperitoneum maintained w/o HD changes using the AirSeal to pressures of 15 mm Hg with CO2. No evidence of bowel injuries.  Two 8.5 mm ports placed under direct vision in each upper quadrant. The laparoscopy revealed bilateral defect(s).   The robot was brought ot the table and docked in the standard fashion, no collision between arms was observed. Instruments were kept under direct view at all times. For bilateral inguinal hernia repair,  I developed a peritoneal flap. The sac(s) were reduced and dissected free from adjacent structures. We preserved the vas and the vessels, and visualized them to their convergence and beyond in the retroperitoneum. Once dissection was completed a large left sided BARD 3D Light mesh was placed and secured at three points with interrupted 0 Vicryl to the pubic tubercle and anteriorly. There was good coverage of the direct, indirect and femoral spaces.  Second look revealed no complications or injuries. Attention then was turned to the opposite side. The sac was also reduced and dissected free from adjacent structures. We preserved the vas and the vessels, in like manner with adequate posterior dissection. Once dissection was completed the same, but contralateral right sided large Bard 3D mesh was placed and secured in like manner with interrupted 0 Vicryl. There was good coverage of the direct, indirect and femoral spaces.  The flap was then closed with 2-0 V-lock suture.  Peritoneal closure without defects.  Once assuring that hemostasis was adequate, all needles/sponges removed, and the robot was undocked.  Under direct visualization I placed the Veress needle into the preperitoneal space the  Veress' valve was released allowing extraperitoneal CO2 to escape, it was also used to access the space for  supplemental local anesthesia. The ports were removed, the abdomen desulflated.  4-0 subcuticular Monocryl was used at all skin edges. Dermabond was placed.  Patient tolerated the procedure well. There were no complications. He was taken to the recovery room in stable condition.           Ronny Bacon, M.D., [facility] 03/19/2022, 6:43 PM

## 2022-03-19 NOTE — Transfer of Care (Signed)
Immediate Anesthesia Transfer of Care Note  Patient: Edward Macias  Procedure(s) Performed: XI ROBOTIC ASSISTED INGUINAL HERNIA, recurrent (Bilateral) INSERTION OF MESH (Bilateral: Inguinal)  Patient Location: PACU  Anesthesia Type:General  Level of Consciousness: drowsy  Airway & Oxygen Therapy: Patient Spontanous Breathing and Patient connected to face mask oxygen  Post-op Assessment: Report given to RN and Post -op Vital signs reviewed and stable  Post vital signs: Reviewed and stable  Last Vitals:  Vitals Value Taken Time  BP 147/99 03/19/22 1847  Temp 36.2 C 03/19/22 1847  Pulse 65 03/19/22 1857  Resp 16 03/19/22 1857  SpO2 100 % 03/19/22 1857  Vitals shown include unvalidated device data.  Last Pain:  Vitals:   03/19/22 1847  TempSrc:   PainSc: Asleep         Complications: No notable events documented.

## 2022-03-19 NOTE — Interval H&P Note (Signed)
History and Physical Interval Note:  03/19/2022 12:16 PM  Edward Macias  has presented today for surgery, with the diagnosis of recurrent left inguinal hernia.  The various methods of treatment have been discussed with the patient and family. After consideration of risks, benefits and other options for treatment, the patient has consented to  Procedure(s): XI ROBOTIC ASSISTED INGUINAL HERNIA, recurrent (Left) as a surgical intervention.  The patient's history has been reviewed, patient examined, no change in status, stable for surgery.  I have reviewed the patient's chart and labs.  Questions were answered to the patient's satisfaction.   The left side is marked.   Ronny Bacon

## 2022-03-19 NOTE — Anesthesia Procedure Notes (Signed)
Procedure Name: Intubation Date/Time: 03/19/2022 4:57 PM  Performed by: Kelton Pillar, CRNAPre-anesthesia Checklist: Patient identified, Emergency Drugs available, Suction available and Patient being monitored Patient Re-evaluated:Patient Re-evaluated prior to induction Oxygen Delivery Method: Circle system utilized Preoxygenation: Pre-oxygenation with 100% oxygen Induction Type: IV induction Ventilation: Mask ventilation without difficulty Laryngoscope Size: McGraph and 3 Grade View: Grade I Tube type: Oral Tube size: 7.0 mm Number of attempts: 1 Airway Equipment and Method: Stylet and Oral airway Placement Confirmation: ETT inserted through vocal cords under direct vision, positive ETCO2, breath sounds checked- equal and bilateral and CO2 detector Secured at: 21 cm Tube secured with: Tape Dental Injury: Teeth and Oropharynx as per pre-operative assessment

## 2022-03-19 NOTE — Discharge Instructions (Signed)

## 2022-03-19 NOTE — Anesthesia Preprocedure Evaluation (Addendum)
Anesthesia Evaluation  Patient identified by MRN, date of birth, ID band Patient awake    Reviewed: Allergy & Precautions, NPO status , Patient's Chart, lab work & pertinent test results  History of Anesthesia Complications Negative for: history of anesthetic complications  Airway Mallampati: III  TM Distance: >3 FB Neck ROM: Full    Dental  (+) Missing, Poor Dentition, Dental Advidsory Given   Pulmonary asthma , neg sleep apnea, neg COPD, Current Smoker and Patient abstained from smoking.   breath sounds clear to auscultation- rhonchi (-) wheezing      Cardiovascular Exercise Tolerance: Good (-) hypertension(-) CAD and (-) Past MI  Rhythm:Regular Rate:Normal - Systolic murmurs and - Diastolic murmurs    Neuro/Psych negative neurological ROS  negative psych ROS   GI/Hepatic negative GI ROS, Neg liver ROS,,,  Endo/Other  negative endocrine ROSneg diabetes    Renal/GU negative Renal ROS     Musculoskeletal  (+) Arthritis ,    Abdominal  (+) - obese  Peds  Hematology negative hematology ROS (+)   Anesthesia Other Findings Past Medical History: No date: Arthritis No date: Asthma     Comment: AS A CHILD-NO INHALERS   Reproductive/Obstetrics                             Anesthesia Physical Anesthesia Plan  ASA: 2  Anesthesia Plan: General   Post-op Pain Management:    Induction: Intravenous  PONV Risk Score and Plan: 2  Airway Management Planned: Oral ETT  Additional Equipment:   Intra-op Plan:   Post-operative Plan: Extubation in OR  Informed Consent: I have reviewed the patients History and Physical, chart, labs and discussed the procedure including the risks, benefits and alternatives for the proposed anesthesia with the patient or authorized representative who has indicated his/her understanding and acceptance.     Dental advisory given  Plan Discussed with: CRNA and  Anesthesiologist  Anesthesia Plan Comments: (Patient consented for risks of anesthesia including but not limited to:  - adverse reactions to medications - damage to eyes, teeth, lips or other oral mucosa - nerve damage due to positioning  - sore throat or hoarseness - Damage to heart, brain, nerves, lungs, other parts of body or loss of life  Patient voiced understanding.)        Anesthesia Quick Evaluation

## 2022-03-20 ENCOUNTER — Encounter: Payer: Self-pay | Admitting: Surgery

## 2022-03-20 DIAGNOSIS — K4090 Unilateral inguinal hernia, without obstruction or gangrene, not specified as recurrent: Secondary | ICD-10-CM

## 2022-03-24 NOTE — Anesthesia Postprocedure Evaluation (Signed)
Anesthesia Post Note  Patient: Edward Macias  Procedure(s) Performed: XI ROBOTIC ASSISTED INGUINAL HERNIA, recurrent (Bilateral) INSERTION OF MESH (Bilateral: Inguinal)  Patient location during evaluation: PACU Anesthesia Type: General Level of consciousness: awake and alert Pain management: pain level controlled Vital Signs Assessment: post-procedure vital signs reviewed and stable Respiratory status: spontaneous breathing, nonlabored ventilation, respiratory function stable and patient connected to nasal cannula oxygen Cardiovascular status: blood pressure returned to baseline and stable Postop Assessment: no apparent nausea or vomiting Anesthetic complications: no   No notable events documented.   Last Vitals:  Vitals:   03/19/22 1916 03/19/22 1930  BP: (!) 152/99 (!) 170/97  Pulse: 72 75  Resp: 13 16  Temp:    SpO2: 97% 98%    Last Pain:  Vitals:   03/20/22 0859  TempSrc:   PainSc: 0-No pain                 Molli Barrows

## 2022-09-29 ENCOUNTER — Other Ambulatory Visit: Payer: Self-pay

## 2023-01-21 ENCOUNTER — Emergency Department: Payer: Medicaid Other

## 2023-01-21 ENCOUNTER — Other Ambulatory Visit: Payer: Self-pay

## 2023-01-21 ENCOUNTER — Emergency Department
Admission: EM | Admit: 2023-01-21 | Discharge: 2023-01-21 | Disposition: A | Discharge status: Home / Self Care | Payer: Medicaid Other | Attending: Emergency Medicine | Admitting: Emergency Medicine

## 2023-01-21 DIAGNOSIS — R0789 Other chest pain: Secondary | ICD-10-CM | POA: Insufficient documentation

## 2023-01-21 DIAGNOSIS — M7918 Myalgia, other site: Secondary | ICD-10-CM

## 2023-01-21 DIAGNOSIS — M542 Cervicalgia: Secondary | ICD-10-CM | POA: Insufficient documentation

## 2023-01-21 DIAGNOSIS — Y92410 Unspecified street and highway as the place of occurrence of the external cause: Secondary | ICD-10-CM | POA: Diagnosis not present

## 2023-01-21 DIAGNOSIS — R930 Abnormal findings on diagnostic imaging of skull and head, not elsewhere classified: Secondary | ICD-10-CM | POA: Diagnosis not present

## 2023-01-21 DIAGNOSIS — M791 Myalgia, unspecified site: Secondary | ICD-10-CM | POA: Insufficient documentation

## 2023-01-21 MED ORDER — ACETAMINOPHEN 500 MG PO TABS
1000.0000 mg | ORAL_TABLET | Freq: Once | ORAL | Status: DC
  Filled 2023-01-21: qty 2
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
## 2023-01-21 NOTE — Discharge Instructions (Addendum)
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
## 2024-03-03 ENCOUNTER — Other Ambulatory Visit: Payer: Self-pay

## 2024-03-03 ENCOUNTER — Emergency Department: Payer: Self-pay

## 2024-03-03 ENCOUNTER — Inpatient Hospital Stay
Admission: EM | Admit: 2024-03-03 | Discharge: 2024-03-05 | DRG: 190 | Disposition: A | Discharge status: Home / Self Care | Payer: Self-pay | Attending: Internal Medicine | Admitting: Internal Medicine

## 2024-03-03 DIAGNOSIS — Z7951 Long term (current) use of inhaled steroids: Secondary | ICD-10-CM

## 2024-03-03 DIAGNOSIS — J069 Acute upper respiratory infection, unspecified: Secondary | ICD-10-CM

## 2024-03-03 DIAGNOSIS — Z79899 Other long term (current) drug therapy: Secondary | ICD-10-CM

## 2024-03-03 DIAGNOSIS — J209 Acute bronchitis, unspecified: Secondary | ICD-10-CM | POA: Diagnosis present

## 2024-03-03 DIAGNOSIS — Z1152 Encounter for screening for COVID-19: Secondary | ICD-10-CM

## 2024-03-03 DIAGNOSIS — R051 Acute cough: Principal | ICD-10-CM

## 2024-03-03 DIAGNOSIS — F17210 Nicotine dependence, cigarettes, uncomplicated: Secondary | ICD-10-CM | POA: Diagnosis present

## 2024-03-03 DIAGNOSIS — R0902 Hypoxemia: Secondary | ICD-10-CM

## 2024-03-03 DIAGNOSIS — I10 Essential (primary) hypertension: Secondary | ICD-10-CM | POA: Diagnosis present

## 2024-03-03 DIAGNOSIS — Z8701 Personal history of pneumonia (recurrent): Secondary | ICD-10-CM

## 2024-03-03 DIAGNOSIS — J9601 Acute respiratory failure with hypoxia: Secondary | ICD-10-CM | POA: Diagnosis present

## 2024-03-03 DIAGNOSIS — J440 Chronic obstructive pulmonary disease with acute lower respiratory infection: Secondary | ICD-10-CM | POA: Diagnosis present

## 2024-03-03 DIAGNOSIS — J441 Chronic obstructive pulmonary disease with (acute) exacerbation: Principal | ICD-10-CM | POA: Diagnosis present

## 2024-03-03 LAB — COMPREHENSIVE METABOLIC PANEL WITH GFR
ALT: 24 U/L (ref 0–44)
AST: 28 U/L (ref 15–41)
Albumin: 4.3 g/dL (ref 3.5–5.0)
Alkaline Phosphatase: 67 U/L (ref 38–126)
Anion gap: 12 (ref 5–15)
BUN: 7 mg/dL — ABNORMAL LOW (ref 8–23)
CO2: 25 mmol/L (ref 22–32)
Calcium: 9.5 mg/dL (ref 8.9–10.3)
Chloride: 100 mmol/L (ref 98–111)
Creatinine, Ser: 0.7 mg/dL (ref 0.61–1.24)
GFR, Estimated: >60 mL/min (ref >60)
Glucose, Bld: 102 mg/dL — ABNORMAL HIGH (ref 70–99)
Potassium: 3.7 mmol/L (ref 3.5–5.1)
Sodium: 137 mmol/L (ref 135–145)
Total Bilirubin: 0.7 mg/dL (ref 0.0–1.2)
Total Protein: 8.7 g/dL — ABNORMAL HIGH (ref 6.5–8.1)

## 2024-03-03 LAB — CBC WITH DIFFERENTIAL/PLATELET
Abs Immature Granulocytes: 0.03 K/uL (ref 0.00–0.07)
Basophils Absolute: 0.1 K/uL (ref 0.0–0.1)
Basophils Relative: 1 %
Eosinophils Absolute: 0.6 K/uL — ABNORMAL HIGH (ref 0.0–0.5)
Eosinophils Relative: 5 %
HCT: 47.1 % (ref 39.0–52.0)
Hemoglobin: 15.4 g/dL (ref 13.0–17.0)
Immature Granulocytes: 0 %
Lymphocytes Relative: 23 %
Lymphs Abs: 2.4 K/uL (ref 0.7–4.0)
MCH: 32.4 pg (ref 26.0–34.0)
MCHC: 32.7 g/dL (ref 30.0–36.0)
MCV: 99.2 fL (ref 80.0–100.0)
Monocytes Absolute: 0.7 K/uL (ref 0.1–1.0)
Monocytes Relative: 7 %
Neutro Abs: 6.7 K/uL (ref 1.7–7.7)
Neutrophils Relative %: 64 %
Platelets: 235 K/uL (ref 150–400)
RBC: 4.75 MIL/uL (ref 4.22–5.81)
RDW: 15.6 % — ABNORMAL HIGH (ref 11.5–15.5)
Smear Review: NORMAL
WBC: 10.6 K/uL — ABNORMAL HIGH (ref 4.0–10.5)
nRBC: 0 % (ref 0.0–0.2)

## 2024-03-03 LAB — RESP PANEL BY RT-PCR (RSV, FLU A&B, COVID)  RVPGX2
Influenza A by PCR: NEGATIVE
Influenza B by PCR: NEGATIVE
Resp Syncytial Virus by PCR: NEGATIVE
SARS Coronavirus 2 by RT PCR: NEGATIVE

## 2024-03-03 LAB — LACTIC ACID, PLASMA: Lactic Acid, Venous: 1.4 mmol/L (ref 0.5–1.9)

## 2024-03-03 LAB — TROPONIN I (HIGH SENSITIVITY): Troponin I (High Sensitivity): 8 ng/L (ref <18)

## 2024-03-03 LAB — BRAIN NATRIURETIC PEPTIDE: B Natriuretic Peptide: 76.8 pg/mL (ref 0.0–100.0)

## 2024-03-03 MED ORDER — IPRATROPIUM-ALBUTEROL 0.5-2.5 (3) MG/3ML IN SOLN
9.0000 mL | Freq: Once | RESPIRATORY_TRACT | Status: AC
  Administered 2024-03-03: 9 mL via RESPIRATORY_TRACT
  Filled 2024-03-03: qty 9

## 2024-03-03 MED ORDER — METHYLPREDNISOLONE SODIUM SUCC 125 MG IJ SOLR
125.0000 mg | Freq: Once | INTRAMUSCULAR | Status: AC
  Administered 2024-03-03: 125 mg via INTRAVENOUS
  Filled 2024-03-03: qty 2

## 2024-03-03 MED ORDER — IPRATROPIUM-ALBUTEROL 0.5-2.5 (3) MG/3ML IN SOLN
3.0000 mL | Freq: Four times a day (QID) | RESPIRATORY_TRACT | Status: DC
  Administered 2024-03-03 – 2024-03-04 (×4): 3 mL via RESPIRATORY_TRACT
  Filled 2024-03-03 (×5): qty 3

## 2024-03-03 MED ORDER — SODIUM CHLORIDE 0.9 % IV BOLUS
500.0000 mL | Freq: Once | INTRAVENOUS | Status: AC
  Administered 2024-03-03: 500 mL via INTRAVENOUS

## 2024-03-03 MED ORDER — DOXYCYCLINE HYCLATE 100 MG PO TABS
100.0000 mg | ORAL_TABLET | Freq: Two times a day (BID) | ORAL | Status: DC
  Administered 2024-03-03 – 2024-03-05 (×4): 100 mg via ORAL
  Filled 2024-03-03 (×4): qty 1

## 2024-03-03 MED ORDER — DOXYCYCLINE HYCLATE 100 MG PO TABS
100.0000 mg | ORAL_TABLET | Freq: Once | ORAL | Status: AC
  Administered 2024-03-03: 100 mg via ORAL
  Filled 2024-03-03: qty 1

## 2024-03-03 MED ORDER — CYCLOBENZAPRINE HCL 10 MG PO TABS
5.0000 mg | ORAL_TABLET | Freq: Three times a day (TID) | ORAL | Status: DC | PRN
  Administered 2024-03-03: 5 mg via ORAL
  Filled 2024-03-03: qty 1

## 2024-03-03 MED ORDER — ENOXAPARIN SODIUM 40 MG/0.4ML IJ SOSY
40.0000 mg | PREFILLED_SYRINGE | Freq: Every day | INTRAMUSCULAR | Status: DC
  Administered 2024-03-03: 40 mg via SUBCUTANEOUS
  Filled 2024-03-03 (×2): qty 0.4

## 2024-03-03 MED ORDER — HYDRALAZINE HCL 20 MG/ML IJ SOLN: 10.0000 mg | Freq: Four times a day (QID) | INTRAMUSCULAR | Status: DC | PRN

## 2024-03-03 MED ORDER — PREDNISONE 20 MG PO TABS
40.0000 mg | ORAL_TABLET | Freq: Every day | ORAL | Status: DC
  Administered 2024-03-04 – 2024-03-05 (×2): 40 mg via ORAL
  Filled 2024-03-03 (×2): qty 2

## 2024-03-03 MED ORDER — AMLODIPINE BESYLATE 10 MG PO TABS
10.0000 mg | ORAL_TABLET | Freq: Every day | ORAL | Status: DC
  Administered 2024-03-03 – 2024-03-05 (×3): 10 mg via ORAL
  Filled 2024-03-03 (×3): qty 1

## 2024-03-03 NOTE — H&P (Signed)
 History and Physical    Patient: Edward Macias FMW:[id number] DOB: January 08, 1961 DOA: 03/03/2024 DOS: the patient was seen and examined on 03/03/2024 PCP: Patient, No Pcp Per  Patient coming from: Home  Chief Complaint: Shortness of breath Chief Complaint  Patient presents with   Cough   Shortness of Breath   HPI: Edward Macias is a 63 y.o. male with medical history significant of COPD, asthma, arthritis, who was otherwise well until a week ago when he started having gradual onset of worsening shortness of breath with associated wheezing.  Also has nonproductive cough.  Denies orthopnea, PND, nausea vomiting or abdominal pain  ED course: Upon arrival to the emergency room saturation was 86%. Patient was placed on intranasal oxygen and given nebulization.  Chest x-ray did not show any infiltrate Temperature 98.9, respiratory rate 92, blood pressure 175/96 saturating 93% on 3 L of oxygen  Review of Systems: As mentioned in the history of present illness. All other systems reviewed and are negative. Past Medical History:  Diagnosis Date   Arthritis    Asthma    AS A CHILD-NO INHALERS   Asthma exacerbation 04/09/2019   COPD (chronic obstructive pulmonary disease) (HCC)    Pneumonia    Sepsis (HCC) 07/24/2018   Past Surgical History:  Procedure Laterality Date   INGUINAL HERNIA REPAIR Left 10/03/2016   Procedure: HERNIA REPAIR INGUINAL ADULT;  Surgeon: Claudene Larinda Bolder, MD;  Location: [facility] ORS;  Service: General;  Laterality: Left;   INSERTION OF MESH Bilateral 03/19/2022   Procedure: INSERTION OF MESH;  Surgeon: Lane Shope, MD;  Location: [facility] ORS;  Service: General;  Laterality: Bilateral;   Social History:  reports that he has been smoking cigarettes. He has a 39 pack-year smoking history. He has never used smokeless tobacco. He reports current alcohol use. He reports that he does not use drugs.  No Known Allergies  History reviewed. No pertinent family  history.  Prior to Admission medications   Medication Sig Start Date End Date Taking? Authorizing Provider  fluticasone-salmeterol (ADVAIR) 250-50 MCG/ACT AEPB Inhale 1 puff into the lungs daily.    [provider]  gabapentin  (NEURONTIN ) 100 MG capsule Take 1 capsule by mouth 3 (three) times daily. Patient not taking: Reported on 03/12/2022 08/26/18   [provider]  HYDROcodone -acetaminophen  (NORCO/VICODIN) 5-325 MG tablet Take 1 tablet by mouth every 6 (six) hours as needed for moderate pain. 03/19/22   Lane Shope, MD  ibuprofen  (ADVIL ) 800 MG tablet Take 1 tablet (800 mg total) by mouth every 8 (eight) hours as needed. 03/19/22   Lane Shope, MD  tiotropium (SPIRIVA ) 18 MCG inhalation capsule Place 1 capsule (18 mcg total) into inhaler and inhale daily. 04/17/20 03/19/22  Loreda Gladis MATSU, PA-C    Physical Exam: Vitals:   03/03/24 1452 03/03/24 1615 03/03/24 1630 03/03/24 1639  BP:  (!) 160/97 (!) 145/91   Pulse:  92 79 87  Resp:  (!) 22 (!) 25 20  Temp:      SpO2:  96% 99% 97%  Weight: 65.8 kg     Height: 5' 9 (1.753 m)      General: Middle-age male laying in bed in some respiratory distress requiring intranasal oxygen Neck: normal, supple, no masses, no thyromegaly Respiratory: Bilateral wheezing noted Cardiovascular: Regular rate and rhythm, no murmurs  Abdomen: no tenderness, no masses palpated.  Musculoskeletal: no clubbing / cyanosis.  Skin: no rashes, lesions, ulcers. No induration Neurologic: No facial droops, moving limbs    Data Reviewed:  I have reviewed patient chest x-ray that did not show any infiltrate I have also reviewed below mentioned labs Plan of care discussed with ED physician    Latest Ref Rng & Units 03/03/2024    2:50 PM 02/26/2022    1:24 PM 12/07/2019   12:12 PM  CBC  WBC 4.0 - 10.5 K/uL 10.6  8.3  10.6   Hemoglobin 13.0 - 17.0 g/dL 84.5  84.9  86.4   Hematocrit 39.0 - 52.0 % 47.1  45.3  38.2   Platelets 150 - 400  K/uL 235  242  263        Latest Ref Rng & Units 03/03/2024    2:50 PM 02/26/2022    1:24 PM 12/07/2019   12:12 PM  BMP  Glucose 70 - 99 mg/dL 897  79  871   BUN 8 - 23 mg/dL 7  16  13    Creatinine 0.61 - 1.24 mg/dL 9.29  9.31  9.09   BUN/Creat Ratio 9 - 20   14   Sodium 135 - 145 mmol/L 137  137  140   Potassium 3.5 - 5.1 mmol/L 3.7  4.3  4.2   Chloride 98 - 111 mmol/L 100  102  104   CO2 22 - 32 mmol/L 25  24  22    Calcium 8.9 - 10.3 mg/dL 9.5  9.0  9.8      Assessment and Plan:  Acute hypoxic respiratory failure secondary to COPD exacerbation Patient presented with oxygen saturation 86 Currently requiring 2 L of intranasal oxygen Continue systemic steroid with prednisone  to complete 5 days course Continue doxycycline Continue as needed nebulization Monitor respiratory function closely  Hypertension Placed on hydralazine  and amlodipine   Advance Care Planning:   Code Status: Prior full code  Consults: None  Family Communication: None at bedside  Severity of Illness: The appropriate patient status for this patient is INPATIENT. Inpatient status is judged to be reasonable and necessary in order to provide the required intensity of service to ensure the patient's safety. The patient's presenting symptoms, physical exam findings, and initial radiographic and laboratory data in the context of their chronic comorbidities is felt to place them at high risk for further clinical deterioration. Furthermore, it is not anticipated that the patient will be medically stable for discharge from the hospital within 2 midnights of admission.   * I certify that at the point of admission it is my clinical judgment that the patient will require inpatient hospital care spanning beyond 2 midnights from the point of admission due to high intensity of service, high risk for further deterioration and high frequency of surveillance required.*  Author: Drue ONEIDA Potter, MD 03/03/2024 5:37 PM  For on  call review [email or web address].

## 2024-03-03 NOTE — ED Provider Notes (Signed)
 [facility] Provider Note    Event Date/Time   First MD Initiated Contact with Patient 03/03/24 1609     (approximate)   History   Cough and Shortness of Breath   HPI  Edward Macias is a 63 y.o. male past medical history significant for tobacco use who presents to the emergency department with shortness of breath.  Endorses shortness of breath that has progressively worsened over the past 1 week.  Endorses a nonproductive cough.  Shortness of breath is worse with lying down.  Denies any history of heart failure.  Does not follow regularly with a primary care physician.  No history of DVT or PE.  No recent travel or surgery.  Does endorse smoking approximately 1 pack of cigarettes a day.  1 prior admission for pneumonia in the past.  Denies nausea, vomiting or diarrhea.  No significant alcohol use.  Denies any abdominal pain.     Physical Exam   Triage Vital Signs: ED Triage Vitals  Encounter Vitals Group     BP 03/03/24 1435 (!) 175/96     Girls Systolic BP Percentile --      Girls Diastolic BP Percentile --      Boys Systolic BP Percentile --      Boys Diastolic BP Percentile --      Pulse Rate 03/03/24 1433 92     Resp 03/03/24 1433 19     Temp 03/03/24 1433 98.9 F (37.2 C)     Temp src --      SpO2 03/03/24 1435 (!) 86 %     Weight 03/03/24 1452 145 lb (65.8 kg)     Height 03/03/24 1452 5' 9 (1.753 m)     Head Circumference --      Peak Flow --      Pain Score --      Pain Loc --      Pain Education --      Exclude from Growth Chart --     Most recent vital signs: Vitals:   03/03/24 1630 03/03/24 1639  BP: (!) 145/91   Pulse: 79 87  Resp: (!) 25 20  Temp:    SpO2: 99% 97%    Physical Exam Constitutional:      Appearance: He is well-developed.  HENT:     Head: Atraumatic.  Eyes:     Conjunctiva/sclera: Conjunctivae normal.  Cardiovascular:     Rate and Rhythm: Regular rhythm.  Pulmonary:     Effort: Tachypnea and  respiratory distress present.     Breath sounds: Wheezing and rhonchi present.  Musculoskeletal:     Cervical back: Normal range of motion.     Right lower leg: No edema.     Left lower leg: No edema.  Skin:    General: Skin is warm.     Capillary Refill: Capillary refill takes less than 2 seconds.  Neurological:     General: No focal deficit present.     Mental Status: He is alert. Mental status is at baseline.     IMPRESSION / MDM / ASSESSMENT AND PLAN / ED COURSE  I reviewed the triage vital signs and the nursing notes.  Differential diagnosis including pneumonia, viral illness including COVID/influenza, heart failure, undiagnosed COPD with exacerbation, anemia  On arrival patient was afebrile but found to be hypoxic to 86% on room air.  Placed on 2 L nasal cannula.  Improvement on nasal cannula to 95%.  Significant tachypnea.  EKG  LILLETTE Clotilda Punter, the attending physician, personally viewed and interpreted this ECG.  EKG showed signs of atrial enlargement.  Heart rate 92.  Narrow complex.  QTc 445.  Nonspecific ST changes with no significant ST elevation.  Isolated T wave that is inverted to aVL.  No tachycardic or bradycardic dysrhythmias while on cardiac telemetry.  RADIOLOGY I independently reviewed imaging, my interpretation of imaging: Chest x-ray with findings consistent with emphysema but no findings of acute pneumonia, no infiltrates.  No signs of pulmonary edema.  LABS (all labs ordered are listed, but only abnormal results are displayed) Labs interpreted as -    Labs Reviewed  COMPREHENSIVE METABOLIC PANEL WITH GFR - Abnormal; Notable for the following components:      Result Value   Glucose, Bld 102 (*)    BUN 7 (*)    Total Protein 8.7 (*)    All other components within normal limits  CBC WITH DIFFERENTIAL/PLATELET - Abnormal; Notable for the following components:   WBC 10.6 (*)    RDW 15.6 (*)    Eosinophils Absolute 0.6 (*)    All other components  within normal limits  RESP PANEL BY RT-PCR (RSV, FLU A&B, COVID)  RVPGX2  LACTIC ACID, PLASMA  BRAIN NATRIURETIC PEPTIDE  TROPONIN I (HIGH SENSITIVITY)     MDM  Patient with mild leukocytosis of 10.6.  No significant anemia.  Creatinine is at his baseline.  No significant electrolyte abnormality.  Normal lactic acid.  Chest x-ray with no signs of pneumonia.  EKG without findings of acute ischemia.  No chest pain and have a very low suspicion for ACS.  Patient with significant wheezing and tachypnea with increased work of breathing on my exam.  Given DuoNeb treatment and IV Solu-Medrol .  Clinical picture is most consistent with a COPD exacerbation.  Plan to reevaluate after DuoNeb treatment, if continues to be hypoxic will need admitted for acute hypoxic respiratory failure in the setting of COPD exacerbation.     PROCEDURES:  Critical Care performed: No  Procedures  Patient's presentation is most consistent with acute presentation with potential threat to life or bodily function.   MEDICATIONS ORDERED IN ED: Medications  ipratropium-albuterol  (DUONEB) 0.5-2.5 (3) MG/3ML nebulizer solution 9 mL (9 mLs Nebulization Given 03/03/24 1652)  sodium chloride  0.9 % bolus 500 mL (500 mLs Intravenous New Bag/Given 03/03/24 1656)  methylPREDNISolone  sodium succinate (SOLU-MEDROL ) 125 mg/2 mL injection 125 mg (125 mg Intravenous Given 03/03/24 1654)  doxycycline (VIBRA-TABS) tablet 100 mg (100 mg Oral Given 03/03/24 1703)    FINAL CLINICAL IMPRESSION(S) / ED DIAGNOSES   Final diagnoses:  Acute cough  Upper respiratory tract infection, unspecified type  Acute bronchitis, unspecified organism  Hypoxia     Rx / DC Orders   ED Discharge Orders     None        Note:  This document was prepared using Dragon voice recognition software and may include unintentional dictation errors.   Punter Clotilda, MD 03/03/24 [phone]

## 2024-03-03 NOTE — ED Triage Notes (Addendum)
 PT arrives via POV. PT reports productive cough and sob for the past week. Pt denies chest pain. He is AxOx4.SPO2 during triage is 86% RA.

## 2024-03-04 LAB — BASIC METABOLIC PANEL WITH GFR
Anion gap: 9 (ref 5–15)
BUN: 12 mg/dL (ref 8–23)
CO2: 28 mmol/L (ref 22–32)
Calcium: 9.2 mg/dL (ref 8.9–10.3)
Chloride: 101 mmol/L (ref 98–111)
Creatinine, Ser: 0.95 mg/dL (ref 0.61–1.24)
GFR, Estimated: >60 mL/min (ref >60)
Glucose, Bld: 201 mg/dL — ABNORMAL HIGH (ref 70–99)
Potassium: 4.3 mmol/L (ref 3.5–5.1)
Sodium: 138 mmol/L (ref 135–145)

## 2024-03-04 LAB — CBC
HCT: 45.5 % (ref 39.0–52.0)
Hemoglobin: 14.8 g/dL (ref 13.0–17.0)
MCH: 32.1 pg (ref 26.0–34.0)
MCHC: 32.5 g/dL (ref 30.0–36.0)
MCV: 98.7 fL (ref 80.0–100.0)
Platelets: 224 K/uL (ref 150–400)
RBC: 4.61 MIL/uL (ref 4.22–5.81)
RDW: 15.4 % (ref 11.5–15.5)
WBC: 8.8 K/uL (ref 4.0–10.5)
nRBC: 0 % (ref 0.0–0.2)

## 2024-03-04 LAB — HEMOGLOBIN A1C
Hgb A1c MFr Bld: 5.1 % (ref 4.8–5.6)
Mean Plasma Glucose: 99.67 mg/dL

## 2024-03-04 LAB — HIV ANTIBODY (ROUTINE TESTING W REFLEX): HIV Screen 4th Generation wRfx: NONREACTIVE

## 2024-03-04 MED ORDER — IPRATROPIUM-ALBUTEROL 0.5-2.5 (3) MG/3ML IN SOLN
3.0000 mL | Freq: Three times a day (TID) | RESPIRATORY_TRACT | Status: DC
  Administered 2024-03-05: 3 mL via RESPIRATORY_TRACT
  Filled 2024-03-04: qty 3

## 2024-03-04 NOTE — Plan of Care (Addendum)
 Patient remains A&Ox4, VSS. Currently on RA maintaining O2 sat greater than 92%. Is independent with ambulation.   Problem: Education: Goal: Knowledge of General Education information will improve Description: Including pain rating scale, medication(s)/side effects and non-pharmacologic comfort measures Outcome: Progressing   Problem: Health Behavior/Discharge Planning: Goal: Ability to manage health-related needs will improve Outcome: Progressing   Problem: Clinical Measurements: Goal: Ability to maintain clinical measurements within normal limits will improve Outcome: Progressing Goal: Will remain free from infection Outcome: Progressing Goal: Diagnostic test results will improve Outcome: Progressing Goal: Respiratory complications will improve Outcome: Progressing Goal: Cardiovascular complication will be avoided Outcome: Progressing   Problem: Coping: Goal: Level of anxiety will decrease Outcome: Progressing   Problem: Elimination: Goal: Will not experience complications related to bowel motility Outcome: Progressing Goal: Will not experience complications related to urinary retention Outcome: Progressing   Problem: Safety: Goal: Ability to remain free from injury will improve Outcome: Progressing   Problem: Pain Managment: Goal: General experience of comfort will improve and/or be controlled Outcome: Progressing   Problem: Skin Integrity: Goal: Risk for impaired skin integrity will decrease Outcome: Progressing   Problem: Education: Goal: Knowledge of disease or condition will improve Outcome: Progressing Goal: Knowledge of the prescribed therapeutic regimen will improve Outcome: Progressing Goal: Individualized Educational Video(s) Outcome: Progressing   Problem: Activity: Goal: Ability to tolerate increased activity will improve Outcome: Progressing Goal: Will verbalize the importance of balancing activity with adequate rest periods Outcome:  Progressing   Problem: Respiratory: Goal: Ability to maintain a clear airway will improve Outcome: Progressing Goal: Levels of oxygenation will improve Outcome: Progressing Goal: Ability to maintain adequate ventilation will improve Outcome: Progressing

## 2024-03-04 NOTE — Plan of Care (Signed)
   Problem: Education: Goal: Knowledge of General Education information will improve Description Including pain rating scale, medication(s)/side effects and non-pharmacologic comfort measures Outcome: Progressing

## 2024-03-04 NOTE — Progress Notes (Signed)
  Progress Note   Patient: Edward Macias FMW:[id number] DOB: [birth year] DOA: 03/03/2024     1 DOS: the patient was seen and examined on 03/04/2024   Brief hospital course: From HPI Kyros Salzwedel is a 63 y.o. male with medical history significant of COPD, asthma, arthritis, who was otherwise well until a week ago when he started having gradual onset of worsening shortness of breath with associated wheezing.  Also has nonproductive cough.  Denies orthopnea, PND, nausea vomiting or abdominal pain   ED course: Upon arrival to the emergency room saturation was 86%. Patient was placed on intranasal oxygen and given nebulization.  Chest x-ray did not show any infiltrate Temperature 98.9, respiratory rate 92, blood pressure 175/96 saturating 93% on 3 L of oxygen   Assessment and Plan:  Acute hypoxic respiratory failure secondary to COPD exacerbation Patient presented with oxygen saturation 86 Currently requiring 2 L of intranasal oxygen Continue systemic steroid with prednisone  to complete 5 days course Continue doxycycline Continue as needed nebulization Monitor respiratory function closely Wean off oxygen as tolerated   Hypertension Placed on hydralazine  and amlodipine    Advance Care Planning:   Code Status: Prior full code   Consults: None   Family Communication: None at bedside    Subjective:  Denies nausea vomiting Still requiring intranasal oxygen 1 to 2 L We will continue to wean off as tolerated Denies chest pain   Physical Exam: General: Middle-age male laying in bed in some respiratory distress requiring intranasal oxygen Neck: normal, supple, no masses, no thyromegaly Respiratory: Bilateral wheezing noted Cardiovascular: Regular rate and rhythm, no murmurs  Abdomen: no tenderness, no masses palpated.  Musculoskeletal: no clubbing / cyanosis.  Skin: no rashes, lesions, ulcers. No induration Neurologic: No facial droops, moving limbs  Vitals:   03/04/24 1230  03/04/24 1353 03/04/24 1419 03/04/24 1546  BP:    127/78  Pulse:    80  Resp:    17  Temp:    99 F (37.2 C)  TempSrc:      SpO2: 93% 97% 98% 93%  Weight:      Height:        Data Reviewed: I have reviewed patient's chest x-ray    Latest Ref Rng & Units 03/04/2024    4:41 AM 03/03/2024    2:50 PM 02/26/2022    1:24 PM  CBC  WBC 4.0 - 10.5 K/uL 8.8  10.6  8.3   Hemoglobin 13.0 - 17.0 g/dL 85.1  84.5  84.9   Hematocrit 39.0 - 52.0 % 45.5  47.1  45.3   Platelets 150 - 400 K/uL 224  235  242        Latest Ref Rng & Units 03/04/2024    4:41 AM 03/03/2024    2:50 PM 02/26/2022    1:24 PM  BMP  Glucose 70 - 99 mg/dL 798  897  79   BUN 8 - 23 mg/dL 12  7  16    Creatinine 0.61 - 1.24 mg/dL 9.04  9.29  9.31   Sodium 135 - 145 mmol/L 138  137  137   Potassium 3.5 - 5.1 mmol/L 4.3  3.7  4.3   Chloride 98 - 111 mmol/L 101  100  102   CO2 22 - 32 mmol/L 28  25  24    Calcium 8.9 - 10.3 mg/dL 9.2  9.5  9.0      Author: Drue ONEIDA Potter, MD 03/04/2024 4:54 PM  For on call review [email or web address].

## 2024-03-05 LAB — CBC WITH DIFFERENTIAL/PLATELET
Abs Immature Granulocytes: 0.1 K/uL — ABNORMAL HIGH (ref 0.00–0.07)
Basophils Absolute: 0.1 K/uL (ref 0.0–0.1)
Basophils Relative: 0 %
Eosinophils Absolute: 0.1 K/uL (ref 0.0–0.5)
Eosinophils Relative: 1 %
HCT: 42.9 % (ref 39.0–52.0)
Hemoglobin: 14.2 g/dL (ref 13.0–17.0)
Immature Granulocytes: 1 %
Lymphocytes Relative: 20 %
Lymphs Abs: 3.4 K/uL (ref 0.7–4.0)
MCH: 32.1 pg (ref 26.0–34.0)
MCHC: 33.1 g/dL (ref 30.0–36.0)
MCV: 96.8 fL (ref 80.0–100.0)
Monocytes Absolute: 1.4 K/uL — ABNORMAL HIGH (ref 0.1–1.0)
Monocytes Relative: 8 %
Neutro Abs: 12 K/uL — ABNORMAL HIGH (ref 1.7–7.7)
Neutrophils Relative %: 70 %
Platelets: 224 K/uL (ref 150–400)
RBC: 4.43 MIL/uL (ref 4.22–5.81)
RDW: 15.5 % (ref 11.5–15.5)
WBC: 17.1 K/uL — ABNORMAL HIGH (ref 4.0–10.5)
nRBC: 0 % (ref 0.0–0.2)

## 2024-03-05 LAB — BASIC METABOLIC PANEL WITH GFR
Anion gap: 11 (ref 5–15)
BUN: 15 mg/dL (ref 8–23)
CO2: 26 mmol/L (ref 22–32)
Calcium: 9 mg/dL (ref 8.9–10.3)
Chloride: 99 mmol/L (ref 98–111)
Creatinine, Ser: 0.83 mg/dL (ref 0.61–1.24)
GFR, Estimated: >60 mL/min (ref >60)
Glucose, Bld: 85 mg/dL (ref 70–99)
Potassium: 3.6 mmol/L (ref 3.5–5.1)
Sodium: 136 mmol/L (ref 135–145)

## 2024-03-05 MED ORDER — AMLODIPINE BESYLATE 10 MG PO TABS: 10.0000 mg | ORAL_TABLET | Freq: Every day | ORAL | 1 refills | Status: DC

## 2024-03-05 MED ORDER — PREDNISONE 20 MG PO TABS: 40.0000 mg | ORAL_TABLET | Freq: Every day | ORAL | 0 refills | Status: AC

## 2024-03-05 MED ORDER — DOXYCYCLINE HYCLATE 100 MG PO TABS: 100.0000 mg | ORAL_TABLET | Freq: Two times a day (BID) | ORAL | 0 refills | Status: AC

## 2024-03-05 NOTE — Progress Notes (Signed)
 Walked patient 250' on room air. O2 sat maintained at 90% to 93% at all times.

## 2024-03-05 NOTE — Plan of Care (Signed)
 Pt calm and cooperative, respirations even and unlabored. Pt expresses desire to be Dcd today

## 2024-03-05 NOTE — Discharge Summary (Signed)
 Physician Discharge Summary   Patient: Edward Macias MRN: [id number] DOB: [birth year]  Admit date:     03/03/2024  Discharge date: 03/05/24  Discharge Physician: Drue ONEIDA Potter   PCP: Patient, No Pcp Per   Recommendations at discharge:  Follow-up with PCP  Discharge Diagnoses:  Acute hypoxic respiratory failure secondary to COPD exacerbation Hypertension  Hospital Course: From HPI Gilad Dugger is a 63 y.o. male with medical history significant of COPD, asthma, arthritis, who was otherwise well until a week ago when he started having gradual onset of worsening shortness of breath with associated wheezing.  Also has nonproductive cough.  Denies orthopnea, PND, nausea vomiting or abdominal pain   ED course: Upon arrival to the emergency room saturation was 86%. Patient was placed on intranasal oxygen and given nebulization.  Chest x-ray did not show any infiltrate Temperature 98.9, respiratory rate 92, blood pressure 175/96 saturating 93% on 3 L of oxygen  patient was admitted and managed for COPD with acute hypoxic respiratory failure. He was weaned off oxygen and underwent a walk test that showed he does not need oxygen at discharge.  Consultants: None Procedures performed: None Disposition: Home Diet recommendation:  Cardiac diet DISCHARGE MEDICATION: Allergies as of 03/05/2024   No Known Allergies      Medication List     STOP taking these medications    HYDROcodone -acetaminophen  5-325 MG tablet Commonly known as: NORCO/VICODIN   ibuprofen  800 MG tablet Commonly known as: ADVIL    tiotropium 18 MCG inhalation capsule Commonly known as: SPIRIVA        TAKE these medications    amLODipine 10 MG tablet Commonly known as: NORVASC Take 1 tablet (10 mg total) by mouth daily. Start taking on: March 06, 2024   doxycycline 100 MG tablet Commonly known as: VIBRA-TABS Take 1 tablet (100 mg total) by mouth every 12 (twelve) hours for 3 days.    fluticasone-salmeterol 250-50 MCG/ACT Aepb Commonly known as: ADVAIR Inhale 1 puff into the lungs daily.   gabapentin  100 MG capsule Commonly known as: NEURONTIN  Take 1 capsule by mouth 3 (three) times daily.   predniSONE  20 MG tablet Commonly known as: DELTASONE  Take 2 tablets (40 mg total) by mouth daily with breakfast for 3 days. Start taking on: March 06, 2024        Discharge Exam: Fredricka Weights   03/03/24 1452 03/03/24 1840  Weight: 65.8 kg 65.8 kg   General: Middle-age male laying in bed in no distress Neck: normal, supple, no masses, no thyromegaly Respiratory: Clear to auscultation bilaterally Cardiovascular: Regular rate and rhythm, no murmurs  Abdomen: no tenderness, no masses palpated.  Musculoskeletal: no clubbing / cyanosis.  Skin: no rashes, lesions, ulcers. No induration Neurologic: No facial droops, moving limbs  Condition at discharge: good  The results of significant diagnostics from this hospitalization (including imaging, microbiology, ancillary and laboratory) are listed below for reference.   Imaging Studies: DG Chest 2 View Result Date: 03/03/2024 EXAM: 2 VIEW(S) XRAY OF THE CHEST 03/03/2024 03:20:05 PM COMPARISON: .01/21/23 CLINICAL HISTORY: SOB (shortness of breath) 141880. Shortness of breath. FINDINGS: LUNGS AND PLEURA: No focal pulmonary opacity. No pulmonary edema. No pleural effusion. No pneumothorax. HEART AND MEDIASTINUM: No acute abnormality of the cardiac and mediastinal silhouettes. BONES AND SOFT TISSUES: Multiple metallic BBs in soft tissues. No acute osseous abnormality. IMPRESSION: 1. No acute cardiopulmonary process. Electronically signed by: Waddell Calk MD 03/03/2024 03:46 PM EDT RP Workstation: [id number]    Microbiology: Results for orders placed or performed during  the hospital encounter of 03/03/24  Resp panel by RT-PCR (RSV, Flu A&B, Covid) Anterior Nasal Swab     Status: None   Collection Time: 03/03/24  5:02 PM    Specimen: Anterior Nasal Swab  Result Value Ref Range Status   SARS Coronavirus 2 by RT PCR NEGATIVE NEGATIVE Final    Comment: (NOTE) SARS-CoV-2 target nucleic acids are NOT DETECTED.  The SARS-CoV-2 RNA is generally detectable in upper respiratory specimens during the acute phase of infection. The lowest concentration of SARS-CoV-2 viral copies this assay can detect is 138 copies/mL. A negative result does not preclude SARS-Cov-2 infection and should not be used as the sole basis for treatment or other patient management decisions. A negative result may occur with  improper specimen collection/handling, submission of specimen other than nasopharyngeal swab, presence of viral mutation(s) within the areas targeted by this assay, and inadequate number of viral copies(<138 copies/mL). A negative result must be combined with clinical observations, patient history, and epidemiological information. The expected result is Negative.  Fact Sheet for Patients:  bloggercourse.com  Fact Sheet for Healthcare Providers:  seriousbroker.it  This test is no t yet approved or cleared by the [place]  FDA and  has been authorized for detection and/or diagnosis of SARS-CoV-2 by FDA under an Emergency Use Authorization (EUA). This EUA will remain  in effect (meaning this test can be used) for the duration of the COVID-19 declaration under Section 564(b)(1) of the Act, 21 U.S.C.section 360bbb-3(b)(1), unless the authorization is terminated  or revoked sooner.       Influenza A by PCR NEGATIVE NEGATIVE Final   Influenza B by PCR NEGATIVE NEGATIVE Final    Comment: (NOTE) The Xpert Xpress SARS-CoV-2/FLU/RSV plus assay is intended as an aid in the diagnosis of influenza from Nasopharyngeal swab specimens and should not be used as a sole basis for treatment. Nasal washings and aspirates are unacceptable for Xpert Xpress  SARS-CoV-2/FLU/RSV testing.  Fact Sheet for Patients: bloggercourse.com  Fact Sheet for Healthcare Providers: seriousbroker.it  This test is not yet approved or cleared by the [place]  FDA and has been authorized for detection and/or diagnosis of SARS-CoV-2 by FDA under an Emergency Use Authorization (EUA). This EUA will remain in effect (meaning this test can be used) for the duration of the COVID-19 declaration under Section 564(b)(1) of the Act, 21 U.S.C. section 360bbb-3(b)(1), unless the authorization is terminated or revoked.     Resp Syncytial Virus by PCR NEGATIVE NEGATIVE Final    Comment: (NOTE) Fact Sheet for Patients: bloggercourse.com  Fact Sheet for Healthcare Providers: seriousbroker.it  This test is not yet approved or cleared by the [place]  FDA and has been authorized for detection and/or diagnosis of SARS-CoV-2 by FDA under an Emergency Use Authorization (EUA). This EUA will remain in effect (meaning this test can be used) for the duration of the COVID-19 declaration under Section 564(b)(1) of the Act, 21 U.S.C. section 360bbb-3(b)(1), unless the authorization is terminated or revoked.  Performed at [facility], [address] [place], [place] [address]     Labs: CBC: Recent Labs  Lab 03/03/24 1450 03/04/24 0441 03/05/24 0335  WBC 10.6* 8.8 17.1*  NEUTROABS 6.7  --  12.0*  HGB 15.4 14.8 14.2  HCT 47.1 45.5 42.9  MCV 99.2 98.7 96.8  PLT 235 224 224   Basic Metabolic Panel: Recent Labs  Lab 03/03/24 1450 03/04/24 0441 03/05/24 0335  NA 137 138 136  K 3.7 4.3 3.6  CL 100  101 99  CO2 25 28 26   GLUCOSE 102* 201* 85  BUN 7* 12 15  CREATININE 0.70 0.95 0.83  CALCIUM 9.5 9.2 9.0   Liver Function Tests: Recent Labs  Lab 03/03/24 1450  AST 28  ALT 24  ALKPHOS 67  BILITOT 0.7  PROT 8.7*  ALBUMIN 4.3   CBG: No  results for input(s): GLUCAP in the last 168 hours.  Discharge time spent:  36 minutes.  Signed: Drue ONEIDA Potter, MD [facility] 03/05/2024

## 2024-03-05 NOTE — Plan of Care (Signed)
 Patient A&Ox4, VSS on RA. Discharge instructions discussed. Patient gathered belongings. All questions answered. Patient walked with NT to POV and discharged.  Problem: Education: Goal: Knowledge of General Education information will improve Description: Including pain rating scale, medication(s)/side effects and non-pharmacologic comfort measures Outcome: Completed/Met   Problem: Health Behavior/Discharge Planning: Goal: Ability to manage health-related needs will improve Outcome: Completed/Met   Problem: Clinical Measurements: Goal: Ability to maintain clinical measurements within normal limits will improve Outcome: Completed/Met Goal: Will remain free from infection Outcome: Completed/Met Goal: Diagnostic test results will improve Outcome: Completed/Met Goal: Respiratory complications will improve Outcome: Completed/Met Goal: Cardiovascular complication will be avoided Outcome: Completed/Met   Problem: Activity: Goal: Risk for activity intolerance will decrease Outcome: Completed/Met   Problem: Nutrition: Goal: Adequate nutrition will be maintained Outcome: Completed/Met   Problem: Coping: Goal: Level of anxiety will decrease Outcome: Completed/Met   Problem: Elimination: Goal: Will not experience complications related to bowel motility Outcome: Completed/Met Goal: Will not experience complications related to urinary retention Outcome: Completed/Met   Problem: Pain Managment: Goal: General experience of comfort will improve and/or be controlled Outcome: Completed/Met   Problem: Safety: Goal: Ability to remain free from injury will improve Outcome: Completed/Met   Problem: Skin Integrity: Goal: Risk for impaired skin integrity will decrease Outcome: Completed/Met   Problem: Education: Goal: Knowledge of disease or condition will improve Outcome: Completed/Met Goal: Knowledge of the prescribed therapeutic regimen will improve Outcome: Completed/Met Goal:  Individualized Educational Video(s) Outcome: Completed/Met   Problem: Activity: Goal: Ability to tolerate increased activity will improve Outcome: Completed/Met Goal: Will verbalize the importance of balancing activity with adequate rest periods Outcome: Completed/Met   Problem: Respiratory: Goal: Ability to maintain a clear airway will improve Outcome: Completed/Met Goal: Levels of oxygenation will improve Outcome: Completed/Met Goal: Ability to maintain adequate ventilation will improve Outcome: Completed/Met

## 2024-03-17 ENCOUNTER — Emergency Department: Payer: Self-pay

## 2024-03-17 ENCOUNTER — Observation Stay
Admission: EM | Admit: 2024-03-17 | Discharge: 2024-03-19 | Disposition: A | Discharge status: Home / Self Care | Payer: Self-pay | Attending: Emergency Medicine | Admitting: Emergency Medicine

## 2024-03-17 ENCOUNTER — Other Ambulatory Visit: Payer: Self-pay

## 2024-03-17 DIAGNOSIS — Z79899 Other long term (current) drug therapy: Secondary | ICD-10-CM | POA: Diagnosis not present

## 2024-03-17 DIAGNOSIS — J9601 Acute respiratory failure with hypoxia: Secondary | ICD-10-CM | POA: Diagnosis not present

## 2024-03-17 DIAGNOSIS — E872 Acidosis, unspecified: Secondary | ICD-10-CM

## 2024-03-17 DIAGNOSIS — R6510 Systemic inflammatory response syndrome (SIRS) of non-infectious origin without acute organ dysfunction: Secondary | ICD-10-CM

## 2024-03-17 DIAGNOSIS — K449 Diaphragmatic hernia without obstruction or gangrene: Secondary | ICD-10-CM | POA: Diagnosis not present

## 2024-03-17 DIAGNOSIS — J441 Chronic obstructive pulmonary disease with (acute) exacerbation: Principal | ICD-10-CM | POA: Diagnosis present

## 2024-03-17 DIAGNOSIS — I700 Atherosclerosis of aorta: Secondary | ICD-10-CM | POA: Insufficient documentation

## 2024-03-17 DIAGNOSIS — D72829 Elevated white blood cell count, unspecified: Secondary | ICD-10-CM | POA: Diagnosis not present

## 2024-03-17 DIAGNOSIS — R0602 Shortness of breath: Principal | ICD-10-CM

## 2024-03-17 DIAGNOSIS — Z720 Tobacco use: Secondary | ICD-10-CM

## 2024-03-17 LAB — RESP PANEL BY RT-PCR (RSV, FLU A&B, COVID)  RVPGX2
Influenza A by PCR: NEGATIVE
Influenza B by PCR: NEGATIVE
Resp Syncytial Virus by PCR: NEGATIVE
SARS Coronavirus 2 by RT PCR: NEGATIVE

## 2024-03-17 LAB — CBC
HCT: 48.8 % (ref 39.0–52.0)
Hemoglobin: 16.4 g/dL (ref 13.0–17.0)
MCH: 32.5 pg (ref 26.0–34.0)
MCHC: 33.6 g/dL (ref 30.0–36.0)
MCV: 96.6 fL (ref 80.0–100.0)
Platelets: 221 K/uL (ref 150–400)
RBC: 5.05 MIL/uL (ref 4.22–5.81)
RDW: 15.8 % — ABNORMAL HIGH (ref 11.5–15.5)
WBC: 17.9 K/uL — ABNORMAL HIGH (ref 4.0–10.5)
nRBC: 0 % (ref 0.0–0.2)

## 2024-03-17 LAB — BASIC METABOLIC PANEL WITH GFR
Anion gap: 24 — ABNORMAL HIGH (ref 5–15)
BUN: 21 mg/dL (ref 8–23)
CO2: 17 mmol/L — ABNORMAL LOW (ref 22–32)
Calcium: 9.2 mg/dL (ref 8.9–10.3)
Chloride: 98 mmol/L (ref 98–111)
Creatinine, Ser: 0.94 mg/dL (ref 0.61–1.24)
GFR, Estimated: >60 mL/min (ref >60)
Glucose, Bld: 59 mg/dL — ABNORMAL LOW (ref 70–99)
Potassium: 4.2 mmol/L (ref 3.5–5.1)
Sodium: 139 mmol/L (ref 135–145)

## 2024-03-17 LAB — CBG MONITORING, ED
Glucose-Capillary: 69 mg/dL — ABNORMAL LOW (ref 70–99)
Glucose-Capillary: 90 mg/dL (ref 70–99)

## 2024-03-17 LAB — D-DIMER, QUANTITATIVE: D-Dimer, Quant: 1.04 ug{FEU}/mL — ABNORMAL HIGH (ref 0.00–0.50)

## 2024-03-17 MED ORDER — IPRATROPIUM-ALBUTEROL 0.5-2.5 (3) MG/3ML IN SOLN
6.0000 mL | Freq: Once | RESPIRATORY_TRACT | Status: AC
  Administered 2024-03-17: 3 mL via RESPIRATORY_TRACT
  Filled 2024-03-17: qty 3

## 2024-03-17 MED ORDER — IOHEXOL 350 MG/ML SOLN
75.0000 mL | Freq: Once | INTRAVENOUS | Status: AC | PRN
Start: 2024-03-17 — End: 2024-03-17
  Administered 2024-03-17: 75 mL via INTRAVENOUS

## 2024-03-17 MED ORDER — LACTATED RINGERS IV BOLUS
1000.0000 mL | Freq: Once | INTRAVENOUS | Status: AC
  Administered 2024-03-17: 1000 mL via INTRAVENOUS

## 2024-03-17 MED ORDER — IPRATROPIUM-ALBUTEROL 0.5-2.5 (3) MG/3ML IN SOLN
RESPIRATORY_TRACT | Status: AC
  Administered 2024-03-17: 3 mL
  Filled 2024-03-17: qty 3

## 2024-03-17 MED ORDER — ALBUTEROL SULFATE (2.5 MG/3ML) 0.083% IN NEBU
2.5000 mg | INHALATION_SOLUTION | Freq: Once | RESPIRATORY_TRACT | Status: AC
  Administered 2024-03-17: 2.5 mg via RESPIRATORY_TRACT
  Filled 2024-03-17: qty 3

## 2024-03-17 MED ORDER — LACTATED RINGERS IV BOLUS
1000.0000 mL | Freq: Once | INTRAVENOUS | Status: AC
  Administered 2024-03-18: 1000 mL via INTRAVENOUS

## 2024-03-17 NOTE — ED Triage Notes (Addendum)
 Pt comes via EMS from home with c/o sob. Pt given 3 duonebs by ems and still states he can't breath. Pt asking for drink currently. Pt given 125 solu medrol . Pt has IV in place.  Pt has hx of copd. Pt seen in ED for same recently and was admitted. Pt was 80s on RA with Fire.  Pt placed on [place] at 3L by EMS.
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
## 2024-03-17 NOTE — ED Triage Notes (Signed)
 Pt c/o worsening SOB over a week. Pt has hx of COPD. Pt reports he doesn't feel better after duonebs. Pt does not appear to have increased work of breathing at this time and is able to hold a conversation without becoming dyspneic.

## 2024-03-17 NOTE — ED Notes (Signed)
 Provided apple juice per veerbal order from Ward M.D.

## 2024-03-17 NOTE — ED Provider Notes (Signed)
 11:10 PM  Assumed care at shift change.  Patient with history of COPD who presents with shortness of breath.  Recently admitted to the hospital for the same.  D-dimer and reassessment pending.   12:38 AM  Pt's D-dimer was elevated so CTA of the chest was obtained.  CT scan reviewed and interpreted by myself and the radiologist and shows no PE or infiltrate.  Still has increased work of breathing but has been weaned off of oxygen.  Due to his metabolic acidosis, lactic was obtained and is greater than 9.  This is likely secondary to albuterol  use and respiratory status rather than sepsis but will give 30 mL/kg IV fluid bolus and broad-spectrum antibiotics as he is tachycardic, tachypneic and has a leukocytosis.  Leukocytosis could be reactive in nature and also due to recent steroid use.  Have recommended admission to the hospital for further evaluation.  Patient and family comfortable with this plan.  2:20 AM  Pt actively vomiting.  Given Zofran .  No abdominal pain.  Repeat lactic pending.  Urine shows no sign of infection.  Procalcitonin negative.  Consulted and discussed patient's case with hospitalist, Dr. Cleatus.  I have recommended admission and consulting physician agrees and will place admission orders.  Patient (and family if present) agree with this plan.   I reviewed all nursing notes, vitals, pertinent previous records.  All labs, EKGs, imaging ordered have been independently reviewed and interpreted by myself.   CRITICAL CARE Performed by: Josette Sink   Total critical care time: 30 minutes  Critical care time was exclusive of separately billable procedures and treating other patients.  Critical care was necessary to treat or prevent imminent or life-threatening deterioration.  Critical care was time spent personally by me on the following activities: development of treatment plan with patient and/or surrogate as well as nursing, discussions with consultants, evaluation of  patient's response to treatment, examination of patient, obtaining history from patient or surrogate, ordering and performing treatments and interventions, ordering and review of laboratory studies, ordering and review of radiographic studies, pulse oximetry and re-evaluation of patient's condition.    Joli Koob, Josette SAILOR, DO 03/18/24 0222

## 2024-03-18 ENCOUNTER — Encounter: Payer: Self-pay | Admitting: Internal Medicine

## 2024-03-18 DIAGNOSIS — D72829 Elevated white blood cell count, unspecified: Secondary | ICD-10-CM

## 2024-03-18 DIAGNOSIS — J441 Chronic obstructive pulmonary disease with (acute) exacerbation: Principal | ICD-10-CM | POA: Diagnosis present

## 2024-03-18 LAB — URINALYSIS, W/ REFLEX TO CULTURE (INFECTION SUSPECTED)
Bacteria, UA: NONE SEEN
Bilirubin Urine: NEGATIVE
Glucose, UA: NEGATIVE mg/dL
Ketones, ur: 20 mg/dL — AB
Leukocytes,Ua: NEGATIVE
Nitrite: NEGATIVE
Protein, ur: NEGATIVE mg/dL
Specific Gravity, Urine: 1.031 — ABNORMAL HIGH (ref 1.005–1.030)
Squamous Epithelial / HPF: 0 /HPF (ref 0–5)
pH: 5 (ref 5.0–8.0)

## 2024-03-18 LAB — BASIC METABOLIC PANEL WITH GFR
Anion gap: 25 — ABNORMAL HIGH (ref 5–15)
Anion gap: 13 (ref 5–15)
BUN: 23 mg/dL (ref 8–23)
BUN: 18 mg/dL (ref 8–23)
CO2: 16 mmol/L — ABNORMAL LOW (ref 22–32)
CO2: 22 mmol/L (ref 22–32)
Calcium: 8.8 mg/dL — ABNORMAL LOW (ref 8.9–10.3)
Calcium: 9 mg/dL (ref 8.9–10.3)
Chloride: 93 mmol/L — ABNORMAL LOW (ref 98–111)
Chloride: 101 mmol/L (ref 98–111)
Creatinine, Ser: 0.96 mg/dL (ref 0.61–1.24)
Creatinine, Ser: 0.58 mg/dL — ABNORMAL LOW (ref 0.61–1.24)
GFR, Estimated: >60 mL/min (ref >60)
GFR, Estimated: >60 mL/min (ref >60)
Glucose, Bld: 97 mg/dL (ref 70–99)
Glucose, Bld: 168 mg/dL — ABNORMAL HIGH (ref 70–99)
Potassium: 4.4 mmol/L (ref 3.5–5.1)
Potassium: 4.7 mmol/L (ref 3.5–5.1)
Sodium: 134 mmol/L — ABNORMAL LOW (ref 135–145)
Sodium: 136 mmol/L (ref 135–145)

## 2024-03-18 LAB — CBC
HCT: 43.6 % (ref 39.0–52.0)
Hemoglobin: 14.4 g/dL (ref 13.0–17.0)
MCH: 31.8 pg (ref 26.0–34.0)
MCHC: 33 g/dL (ref 30.0–36.0)
MCV: 96.2 fL (ref 80.0–100.0)
Platelets: 196 K/uL (ref 150–400)
RBC: 4.53 MIL/uL (ref 4.22–5.81)
RDW: 15.8 % — ABNORMAL HIGH (ref 11.5–15.5)
WBC: 13.5 K/uL — ABNORMAL HIGH (ref 4.0–10.5)
nRBC: 0 % (ref 0.0–0.2)

## 2024-03-18 LAB — CBG MONITORING, ED: Glucose-Capillary: 117 mg/dL — ABNORMAL HIGH (ref 70–99)

## 2024-03-18 LAB — LACTIC ACID, PLASMA
Lactic Acid, Venous: 1.2 mmol/L (ref 0.5–1.9)
Lactic Acid, Venous: 2.8 mmol/L (ref 0.5–1.9)
Lactic Acid, Venous: 4.9 mmol/L (ref 0.5–1.9)
Lactic Acid, Venous: >9 mmol/L (ref 0.5–1.9)

## 2024-03-18 LAB — PROCALCITONIN: Procalcitonin: <0.1 ng/mL

## 2024-03-18 MED ORDER — IPRATROPIUM-ALBUTEROL 0.5-2.5 (3) MG/3ML IN SOLN
3.0000 mL | Freq: Four times a day (QID) | RESPIRATORY_TRACT | Status: DC
Start: 2024-03-18 — End: 2024-03-18

## 2024-03-18 MED ORDER — IPRATROPIUM-ALBUTEROL 0.5-2.5 (3) MG/3ML IN SOLN
3.0000 mL | Freq: Four times a day (QID) | RESPIRATORY_TRACT | Status: DC
  Administered 2024-03-18: 3 mL via RESPIRATORY_TRACT
  Filled 2024-03-18: qty 3

## 2024-03-18 MED ORDER — METHYLPREDNISOLONE SODIUM SUCC 125 MG IJ SOLR
125.0000 mg | Freq: Once | INTRAMUSCULAR | Status: AC
Start: 2024-03-18 — End: 2024-03-18
  Administered 2024-03-18: 125 mg via INTRAVENOUS
  Filled 2024-03-18: qty 2

## 2024-03-18 MED ORDER — SODIUM CHLORIDE 0.9 % IV SOLN
2.0000 g | Freq: Once | INTRAVENOUS | Status: AC
  Administered 2024-03-18: 2 g via INTRAVENOUS
  Filled 2024-03-18: qty 12.5

## 2024-03-18 MED ORDER — ACETAMINOPHEN 325 MG PO TABS
650.0000 mg | ORAL_TABLET | Freq: Four times a day (QID) | ORAL | Status: DC | PRN
  Administered 2024-03-18: 650 mg via ORAL
  Filled 2024-03-18: qty 2

## 2024-03-18 MED ORDER — LACTATED RINGERS IV BOLUS
1000.0000 mL | Freq: Once | INTRAVENOUS | Status: AC
  Administered 2024-03-18: 1000 mL via INTRAVENOUS

## 2024-03-18 MED ORDER — IPRATROPIUM-ALBUTEROL 0.5-2.5 (3) MG/3ML IN SOLN
3.0000 mL | Freq: Once | RESPIRATORY_TRACT | Status: AC
Start: 2024-03-18 — End: 2024-03-17
  Filled 2024-03-18: qty 6

## 2024-03-18 MED ORDER — ENOXAPARIN SODIUM 40 MG/0.4ML IJ SOSY
40.0000 mg | PREFILLED_SYRINGE | INTRAMUSCULAR | Status: DC
  Filled 2024-03-18 (×2): qty 0.4

## 2024-03-18 MED ORDER — ONDANSETRON HCL 4 MG/2ML IJ SOLN
4.0000 mg | Freq: Four times a day (QID) | INTRAMUSCULAR | Status: DC | PRN
Start: 2024-03-18 — End: 2024-03-19

## 2024-03-18 MED ORDER — PREDNISONE 20 MG PO TABS: 40.0000 mg | ORAL_TABLET | Freq: Every day | ORAL | Status: DC

## 2024-03-18 MED ORDER — IPRATROPIUM-ALBUTEROL 0.5-2.5 (3) MG/3ML IN SOLN: 3.0000 mL | Freq: Four times a day (QID) | RESPIRATORY_TRACT | Status: DC

## 2024-03-18 MED ORDER — HYDROCODONE-ACETAMINOPHEN 5-325 MG PO TABS
1.0000 | ORAL_TABLET | ORAL | Status: DC | PRN
  Administered 2024-03-18: 1 via ORAL
  Filled 2024-03-18: qty 1

## 2024-03-18 MED ORDER — ONDANSETRON HCL 4 MG/2ML IJ SOLN
4.0000 mg | Freq: Once | INTRAMUSCULAR | Status: AC
  Administered 2024-03-18: 4 mg via INTRAVENOUS
  Filled 2024-03-18: qty 2

## 2024-03-18 MED ORDER — AMLODIPINE BESYLATE 10 MG PO TABS
10.0000 mg | ORAL_TABLET | Freq: Every day | ORAL | Status: DC
  Administered 2024-03-18 – 2024-03-19 (×2): 10 mg via ORAL
  Filled 2024-03-18 (×2): qty 1

## 2024-03-18 MED ORDER — GUAIFENESIN ER 600 MG PO TB12
1200.0000 mg | ORAL_TABLET | Freq: Two times a day (BID) | ORAL | Status: DC
  Administered 2024-03-18 – 2024-03-19 (×3): 1200 mg via ORAL
  Filled 2024-03-18 (×4): qty 2

## 2024-03-18 MED ORDER — METHYLPREDNISOLONE SODIUM SUCC 40 MG IJ SOLR
40.0000 mg | Freq: Two times a day (BID) | INTRAMUSCULAR | Status: AC
  Administered 2024-03-18 – 2024-03-19 (×2): 40 mg via INTRAVENOUS
  Filled 2024-03-18 (×3): qty 1

## 2024-03-18 MED ORDER — ALBUTEROL SULFATE (2.5 MG/3ML) 0.083% IN NEBU
2.5000 mg | INHALATION_SOLUTION | RESPIRATORY_TRACT | Status: DC | PRN
  Administered 2024-03-18: 2.5 mg via RESPIRATORY_TRACT
  Filled 2024-03-18: qty 3

## 2024-03-18 MED ORDER — DOXYCYCLINE HYCLATE 100 MG PO TABS
100.0000 mg | ORAL_TABLET | Freq: Two times a day (BID) | ORAL | Status: DC
  Administered 2024-03-18 – 2024-03-19 (×3): 100 mg via ORAL
  Filled 2024-03-18 (×3): qty 1

## 2024-03-18 MED ORDER — VANCOMYCIN HCL IN DEXTROSE 1-5 GM/200ML-% IV SOLN
1000.0000 mg | Freq: Once | INTRAVENOUS | Status: AC
  Administered 2024-03-18: 1000 mg via INTRAVENOUS
  Filled 2024-03-18 (×2): qty 200

## 2024-03-18 MED ORDER — ACETAMINOPHEN 650 MG RE SUPP
650.0000 mg | Freq: Four times a day (QID) | RECTAL | Status: DC | PRN
Start: 2024-03-18 — End: 2024-03-19

## 2024-03-18 MED ORDER — METRONIDAZOLE 500 MG/100ML IV SOLN
500.0000 mg | Freq: Once | INTRAVENOUS | Status: AC
  Administered 2024-03-18: 500 mg via INTRAVENOUS
  Filled 2024-03-18 (×2): qty 100

## 2024-03-18 MED ORDER — ONDANSETRON HCL 4 MG PO TABS: 4.0000 mg | ORAL_TABLET | Freq: Four times a day (QID) | ORAL | Status: DC | PRN

## 2024-03-18 MED ORDER — LACTATED RINGERS IV SOLN
INTRAVENOUS | Status: AC
Start: 2024-03-18 — End: 2024-03-18
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
## 2024-03-18 NOTE — ED Notes (Signed)
 Critical lab lactic acid < 9, MD notified.

## 2024-03-18 NOTE — Discharge Instructions (Signed)
 Some PCP options in [place] area- not a comprehensive list  [facility]- [phone] [place]- [phone] [facility]- [phone] [facility]- [phone] [facility]- [phone] Nichole Molly- [phone]  or [facility] Physician Referral Line [phone]   You are encouraged to call the [facility] and arrange an application appointment to be screened for service to get set up with a physician for primary care  You may print the application and take with you to the appointment. At this web address [email or web address] Please Call [facility] for appointment  [phone]  [address] [place], [place] [address]  Office Hours Monday: Closed Tuesday: 9:00am - 4:00pm Wednesday: 9:00am - 4:00pm Thursday: 9:00am - 8:00pm Friday: Closed  Endocrinology 2nd Thursday of the Month: 5:00pm - 8:00pm  Rheumatology/Orthopedic Clinic By appointment only.  Contact for availability.     Services Not Covered Obstetrics Gastrointestinal/Liver Disease   [facility] Primary Care Provider List  [facility] at [facility] [address] [place], [place] [address] [phone]  [facility] at [facility] [address], [place], [place] [address] [phone]  [facility] [address] [place], [place], [place] [address] [phone]  [facility] Primary Care at [facility] [address], [place], [place] [address] [phone]  [facility] Primary Care at [facility] [address], [address] [place], [place], [place] [address] [phone]  [facility] [address] [place], [place], [place] [address] [phone]  [facility] [address], [place], [place] [address] [phone]  [facility] [address], [place], [place] [address] [phone]  [facility] [address] [place], [place] [address] [phone]  [facility] [address], [place], [place] [address] [phone]  [facility] [address] [place], [address], [place], [place] [address] [phone]  [facility] and Adult Medicine [address], [place], [place] [address] [phone]  [facility] Healthcare at [facility] [address] [place], [place], [place] [address] [phone]  [facility] at [facility] [address] [place], [place] [address] [phone]  [facility] at [facility] [address] [place], [place] [address] [phone]

## 2024-03-18 NOTE — Sepsis Progress Note (Addendum)
 Notified bedside nurse of need to draw repeat lactic acid. ED RN via Secure chat. Requested clarification to note to MD of lactic of < 9.0. Per lab results Lactic is >9.0. Notification to confirm blood culture collection and first dose of Antibiotic times.

## 2024-03-18 NOTE — Sepsis Progress Note (Signed)
 Elink monitoring for the code sepsis protocol.

## 2024-03-18 NOTE — Progress Notes (Signed)
 CODE SEPSIS - PHARMACY COMMUNICATION  **Broad Spectrum Antibiotics should be administered within 1 hour of Sepsis diagnosis**  Time Code Sepsis Called/Page Received:  11/7 @ 0038   Antibiotics Ordered: Cefepime , Vancomycin    Time of 1st antibiotic administration: Cefepime 2 gm IV X 1 on 11/7 @ 0117   Additional action taken by pharmacy:   If necessary, Name of Provider/Nurse Contacted:     Brainard Highfill D ,PharmD Clinical Pharmacist  03/18/2024  1:30 AM

## 2024-03-18 NOTE — ED Notes (Signed)
Meds requested from pharmacy at this time.

## 2024-03-18 NOTE — TOC CM/SW Note (Signed)
 Transition of Care [facility]) - Inpatient Brief Assessment   Patient Details  Name: Edward Macias MRN: [id number] Date of Birth: 11-17-1960  Transition of Care [facility]) CM/SW Contact:    Daved JONETTA Hamilton, RN Phone Number: 03/18/2024, 6:27 PM   Clinical Narrative:   Transition of Care (TOC) Screening Note   Patient Details  Name: Edward Macias Date of Birth: May 21, 1960   Transition of Care [facility]) CM/SW Contact:    Daved JONETTA Hamilton, RN Phone Number: 03/18/2024, 6:27 PM  PCP and [facility] resources added to AVS.   Transition of Care Department [facility]) has reviewed patient and no TOC needs have been identified at this time. If new patient transition needs arise, please place a TOC consult.    Transition of Care Asessment: Insurance and Status: Selfpay ([facility] resource added to AVS) Patient has primary care physician: No (PCP resources added to AVS)   Prior level of function:: Independent Prior/Current Home Services: No current home services Social Drivers of Health Review: SDOH reviewed interventions complete Readmission risk has been reviewed: No (patient in obs status, no score generated) Transition of care needs: no transition of care needs at this time

## 2024-03-18 NOTE — Progress Notes (Signed)
 Progress Note   Patient: Edward Macias FMW:[id number] DOB: 11-17-1960 DOA: 03/17/2024     0 DOS: the patient was seen and examined on 03/18/2024   Brief hospital course:  Assessment and Plan: Acute respiratory failure with hypoxia secondary to COPD with acute exacerbation On 2LPM O2 [place] at this encounter Schedule Duonebs to Q6hrs Continue as needed nebulized bronchodilators IV Solumedrol today--> switch to oral prednisone  tomorrow Continue Oral Doxycycline BID Incentive spirometer Wean O2 as tolerated   Leukocytosis SIRS with Lactic acidosis: improved Elevated lactic acid level suspect secondary to increased work of breathing and hypoxia Leukocytosis suspect related to recent steroids during exacerbation couple weeks prior Procalcitonin <0.10 Getting Rocephin  and Doxy due to severity of exacerbation and recurrence Will get an extended respiratory viral panel   DVT prophylaxis: Lovenox    Consults: none   Advance Care Planning:   Code Status: Prior    Family Communication: none    Subjective: Reports shortness of breath and cough. On 2LPM O2 [place]  Denies fever, chills, nausea, vomiting, headache, dizziness, abdominal pain, constipation  Physical Exam: Vitals:   03/18/24 0226 03/18/24 0356 03/18/24 0712 03/18/24 0825  BP:  132/83  (!) 157/88  Pulse:  (!) 110  100  Resp:  18  15  Temp: 98.8 F (37.1 C) 98.6 F (37 C)  98.8 F (37.1 C)  TempSrc: Oral     SpO2:  97% 97% 96%  Weight:      Height:        General:  NAD/NARD. Alert & Oriented x 3. Pleasant and Cooperative.   Eyes: PERRLA. EOMI.   ENMT:   No deformities.  MMM.   Head: Normocephalic/Atraumatic.   Neck: Supple. No cervical LAD.  No JVD.  Respiratory:  CTAB. B/l wheezing and rhonchi appreciated. On 2LPM O2 [place] Normal respiratory rate and rhythm.  Cardiovascular: Heart: RRR. Normal S1/S2. No murmurs/gallops/rubs. No LE edema.   Abdomen:  Soft. NT.  ND. Normoactive BS present. No rebound tenderness, guarding,  fluid or masses/organomegaly present.   Genitourinary: Genital exam deferred.  Musculoskeletal:  No deformities, induration, or joint effusion. Non-tender to palpation.   Skin:  Color, temperature, texture, turgor normal. No rashes, nodules, or evidence of bleeding/bruising. Neurologic: Grossly intact. 5/5 bilateral strength in UE and LE. Normal tone and bulk. Sensation intact.  Reflexes 2+ in all extremities.   Psychologic: Normal affect/mood. Intact speech, language, and memory   Data Reviewed:  Latest Reference Range & Units 03/18/24 05:41  Sodium 135 - 145 mmol/L 136  Potassium 3.5 - 5.1 mmol/L 4.7  Chloride 98 - 111 mmol/L 101  CO2 22 - 32 mmol/L 22  Glucose 70 - 99 mg/dL 831 (H)  BUN 8 - 23 mg/dL 18  Creatinine 9.38 - 8.75 mg/dL 9.41 (L)  Calcium 8.9 - 10.3 mg/dL 9.0  Anion gap 5 - 15  13  GFR, Estimated >60 mL/min >60    Latest Reference Range & Units 03/18/24 05:41 03/18/24 07:28 03/18/24 12:25  Lactic Acid, Venous 0.5 - 1.9 mmol/L 4.9 (HH) 2.8 (HH) 1.2  WBC 4.0 - 10.5 K/uL 13.5 (H)    RBC 4.22 - 5.81 MIL/uL 4.53    Hemoglobin 13.0 - 17.0 g/dL 85.5    HCT 60.9 - 47.9 % 43.6    MCV 80.0 - 100.0 fL 96.2    MCH 26.0 - 34.0 pg 31.8    MCHC 30.0 - 36.0 g/dL 66.9    RDW 88.4 - 84.4 % 15.8 (H)    Platelets 150 -  400 K/uL 196    nRBC 0.0 - 0.2 % 0.0    (HH): Data is critically high (H): Data is abnormally high   Disposition: Status is: Observation  Planned Discharge Destination: Home  Time spent: 35 minutes  Author: Terral DELENA Seashore, MD 03/18/2024 10:59 AM  For on call review [email or web address].

## 2024-03-18 NOTE — Assessment & Plan Note (Addendum)
 Acute respiratory failure with hypoxia Recent exacerbation requiring O2, weaned off prior to discharge Patient presenting with increased work of breathing, wheezing with hypoxia to 88 Schedule and as needed nebulized bronchodilators IV steroids Rocephin  and Doxy due to severity of exacerbation and recurrence Flutter valve and incentive spirometer Supplemental oxygen and wean as tolerated

## 2024-03-18 NOTE — Plan of Care (Signed)

## 2024-03-18 NOTE — Assessment & Plan Note (Addendum)
 SIRS with Lactic acidosis Elevated lactic acid level suspect secondary to increased work of breathing and hypoxia Leukocytosis suspect related to recent steroids during exacerbation couple weeks prior Procalcitonin <0.10 Getting Rocephin  and Doxy due to severity of exacerbation and recurrence Will get an extended respiratory viral panel Continue to trend lactic acid

## 2024-03-19 DIAGNOSIS — J441 Chronic obstructive pulmonary disease with (acute) exacerbation: Secondary | ICD-10-CM | POA: Diagnosis not present

## 2024-03-19 LAB — RESPIRATORY PANEL BY PCR

## 2024-03-19 MED ORDER — PREDNISONE 20 MG PO TABS: 40.0000 mg | ORAL_TABLET | Freq: Every day | ORAL | 0 refills | Status: AC

## 2024-03-19 MED ORDER — DOXYCYCLINE HYCLATE 100 MG PO TABS: 100.0000 mg | ORAL_TABLET | Freq: Two times a day (BID) | ORAL | 0 refills | Status: AC

## 2024-03-19 MED ORDER — ALBUTEROL SULFATE HFA 108 (90 BASE) MCG/ACT IN AERS: 2.0000 | INHALATION_SPRAY | Freq: Four times a day (QID) | RESPIRATORY_TRACT | 1 refills | Status: AC | PRN

## 2024-03-19 MED ORDER — GUAIFENESIN ER 600 MG PO TB12: 600.0000 mg | ORAL_TABLET | Freq: Two times a day (BID) | ORAL | 0 refills | Status: AC

## 2024-03-19 NOTE — Plan of Care (Signed)
  Problem: Health Behavior/Discharge Planning: Goal: Ability to manage health-related needs will improve Outcome: Progressing   Problem: Clinical Measurements: Goal: Ability to maintain clinical measurements within normal limits will improve Outcome: Progressing   Problem: Activity: Goal: Risk for activity intolerance will decrease Outcome: Progressing   Problem: Activity: Goal: Ability to tolerate increased activity will improve Outcome: Progressing   Problem: Respiratory: Goal: Ability to maintain a clear airway will improve Outcome: Progressing Goal: Levels of oxygenation will improve Outcome: Progressing Goal: Ability to maintain adequate ventilation will improve Outcome: Progressing

## 2024-03-19 NOTE — Progress Notes (Signed)
 Resting O2 on room air was 93% Ambulating on Room air around the nursing station x 2 was 93-95% Patient remain on room air no distress
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
## 2024-03-23 LAB — CULTURE, BLOOD (ROUTINE X 2)
Culture: NO GROWTH
Culture: NO GROWTH
Special Requests: ADEQUATE

## 2024-04-23 ENCOUNTER — Inpatient Hospital Stay
Admission: EM | Admit: 2024-04-23 | Discharge: 2024-04-28 | DRG: 871 | Disposition: A | Discharge status: Home / Self Care | Attending: Internal Medicine | Admitting: Internal Medicine

## 2024-04-23 ENCOUNTER — Emergency Department

## 2024-04-23 ENCOUNTER — Other Ambulatory Visit: Payer: Self-pay

## 2024-04-23 DIAGNOSIS — Z716 Tobacco abuse counseling: Secondary | ICD-10-CM

## 2024-04-23 DIAGNOSIS — A419 Sepsis, unspecified organism: Secondary | ICD-10-CM | POA: Diagnosis not present

## 2024-04-23 DIAGNOSIS — N12 Tubulo-interstitial nephritis, not specified as acute or chronic: Secondary | ICD-10-CM | POA: Diagnosis present

## 2024-04-23 DIAGNOSIS — N3001 Acute cystitis with hematuria: Secondary | ICD-10-CM | POA: Diagnosis present

## 2024-04-23 DIAGNOSIS — N170 Acute kidney failure with tubular necrosis: Secondary | ICD-10-CM | POA: Diagnosis present

## 2024-04-23 DIAGNOSIS — K5900 Constipation, unspecified: Secondary | ICD-10-CM | POA: Diagnosis present

## 2024-04-23 DIAGNOSIS — R6520 Severe sepsis without septic shock: Secondary | ICD-10-CM | POA: Diagnosis present

## 2024-04-23 DIAGNOSIS — N10 Acute tubulo-interstitial nephritis: Secondary | ICD-10-CM | POA: Diagnosis present

## 2024-04-23 DIAGNOSIS — E871 Hypo-osmolality and hyponatremia: Secondary | ICD-10-CM | POA: Diagnosis present

## 2024-04-23 DIAGNOSIS — Z79899 Other long term (current) drug therapy: Secondary | ICD-10-CM | POA: Diagnosis not present

## 2024-04-23 DIAGNOSIS — J449 Chronic obstructive pulmonary disease, unspecified: Secondary | ICD-10-CM | POA: Diagnosis present

## 2024-04-23 DIAGNOSIS — N400 Enlarged prostate without lower urinary tract symptoms: Secondary | ICD-10-CM | POA: Diagnosis present

## 2024-04-23 DIAGNOSIS — N179 Acute kidney failure, unspecified: Secondary | ICD-10-CM | POA: Diagnosis present

## 2024-04-23 DIAGNOSIS — B9620 Unspecified Escherichia coli [E. coli] as the cause of diseases classified elsewhere: Secondary | ICD-10-CM | POA: Diagnosis not present

## 2024-04-23 DIAGNOSIS — E872 Acidosis, unspecified: Secondary | ICD-10-CM | POA: Diagnosis present

## 2024-04-23 DIAGNOSIS — Z7951 Long term (current) use of inhaled steroids: Secondary | ICD-10-CM | POA: Diagnosis not present

## 2024-04-23 DIAGNOSIS — J441 Chronic obstructive pulmonary disease with (acute) exacerbation: Secondary | ICD-10-CM | POA: Diagnosis present

## 2024-04-23 DIAGNOSIS — I10 Essential (primary) hypertension: Secondary | ICD-10-CM | POA: Diagnosis present

## 2024-04-23 DIAGNOSIS — E876 Hypokalemia: Secondary | ICD-10-CM | POA: Diagnosis present

## 2024-04-23 DIAGNOSIS — A4151 Sepsis due to Escherichia coli [E. coli]: Principal | ICD-10-CM | POA: Diagnosis present

## 2024-04-23 DIAGNOSIS — F17210 Nicotine dependence, cigarettes, uncomplicated: Secondary | ICD-10-CM | POA: Diagnosis present

## 2024-04-23 LAB — URINALYSIS, ROUTINE W REFLEX MICROSCOPIC
Bilirubin Urine: NEGATIVE
Glucose, UA: NEGATIVE mg/dL
Ketones, ur: NEGATIVE mg/dL
Nitrite: NEGATIVE
Protein, ur: 30 mg/dL — AB
RBC / HPF: >50 RBC/hpf (ref 0–5)
Specific Gravity, Urine: 1.014 (ref 1.005–1.030)
Squamous Epithelial / HPF: 0 /HPF (ref 0–5)
WBC, UA: >50 WBC/hpf (ref 0–5)
pH: 6 (ref 5.0–8.0)

## 2024-04-23 LAB — CBC
HCT: 36.3 % — ABNORMAL LOW (ref 39.0–52.0)
Hemoglobin: 13 g/dL (ref 13.0–17.0)
MCH: 31.9 pg (ref 26.0–34.0)
MCHC: 35.8 g/dL (ref 30.0–36.0)
MCV: 89.2 fL (ref 80.0–100.0)
Platelets: 373 K/uL (ref 150–400)
RBC: 4.07 MIL/uL — ABNORMAL LOW (ref 4.22–5.81)
RDW: 15.4 % (ref 11.5–15.5)
WBC: 33.1 K/uL — ABNORMAL HIGH (ref 4.0–10.5)
nRBC: 0 % (ref 0.0–0.2)

## 2024-04-23 LAB — COMPREHENSIVE METABOLIC PANEL WITH GFR
ALT: 11 U/L (ref 0–44)
AST: 30 U/L (ref 15–41)
Albumin: 2.9 g/dL — ABNORMAL LOW (ref 3.5–5.0)
Alkaline Phosphatase: 156 U/L — ABNORMAL HIGH (ref 38–126)
Anion gap: 18 — ABNORMAL HIGH (ref 5–15)
BUN: 36 mg/dL — ABNORMAL HIGH (ref 8–23)
CO2: 22 mmol/L (ref 22–32)
Calcium: 8.7 mg/dL — ABNORMAL LOW (ref 8.9–10.3)
Chloride: 89 mmol/L — ABNORMAL LOW (ref 98–111)
Creatinine, Ser: 1.97 mg/dL — ABNORMAL HIGH (ref 0.61–1.24)
GFR, Estimated: 37 mL/min — ABNORMAL LOW (ref >60)
Glucose, Bld: 86 mg/dL (ref 70–99)
Potassium: 3.7 mmol/L (ref 3.5–5.1)
Sodium: 129 mmol/L — ABNORMAL LOW (ref 135–145)
Total Bilirubin: 0.9 mg/dL (ref 0.0–1.2)
Total Protein: 7.3 g/dL (ref 6.5–8.1)

## 2024-04-23 LAB — LIPASE, BLOOD: Lipase: 46 U/L (ref 11–51)

## 2024-04-23 LAB — LACTIC ACID, PLASMA: Lactic Acid, Venous: 1.3 mmol/L (ref 0.5–1.9)

## 2024-04-23 MED ORDER — SODIUM CHLORIDE 0.9 % IV BOLUS
1000.0000 mL | INTRAVENOUS | Status: AC
  Administered 2024-04-23 (×2): 1000 mL via INTRAVENOUS

## 2024-04-23 MED ORDER — SENNOSIDES-DOCUSATE SODIUM 8.6-50 MG PO TABS: 1.0000 | ORAL_TABLET | Freq: Every evening | ORAL | Status: DC | PRN

## 2024-04-23 MED ORDER — MORPHINE SULFATE (PF) 4 MG/ML IV SOLN
4.0000 mg | Freq: Once | INTRAVENOUS | Status: AC
  Administered 2024-04-23: 4 mg via INTRAVENOUS
  Filled 2024-04-23: qty 1

## 2024-04-23 MED ORDER — HYDRALAZINE HCL 20 MG/ML IJ SOLN
5.0000 mg | Freq: Four times a day (QID) | INTRAMUSCULAR | Status: DC | PRN
  Administered 2024-04-24 – 2024-04-25 (×2): 5 mg via INTRAVENOUS
  Filled 2024-04-23 (×2): qty 1

## 2024-04-23 MED ORDER — ACETAMINOPHEN 650 MG RE SUPP: 650.0000 mg | Freq: Four times a day (QID) | RECTAL | Status: DC | PRN

## 2024-04-23 MED ORDER — ONDANSETRON HCL 4 MG/2ML IJ SOLN
4.0000 mg | Freq: Once | INTRAMUSCULAR | Status: AC
  Administered 2024-04-23: 4 mg via INTRAVENOUS
  Filled 2024-04-23: qty 2

## 2024-04-23 MED ORDER — ONDANSETRON HCL 4 MG/2ML IJ SOLN
4.0000 mg | Freq: Four times a day (QID) | INTRAMUSCULAR | Status: DC | PRN
  Administered 2024-04-25: 11:00:00 4 mg via INTRAVENOUS
  Filled 2024-04-23: qty 2

## 2024-04-23 MED ORDER — SODIUM CHLORIDE 0.9 % IV SOLN
2.0000 g | INTRAVENOUS | Status: DC
  Administered 2024-04-24 – 2024-04-27 (×4): 2 g via INTRAVENOUS
  Filled 2024-04-23 (×5): qty 20

## 2024-04-23 MED ORDER — SODIUM CHLORIDE 0.9 % IV BOLUS: 1000.0000 mL | Freq: Once | INTRAVENOUS | Status: DC

## 2024-04-23 MED ORDER — TAMSULOSIN HCL 0.4 MG PO CAPS
0.4000 mg | ORAL_CAPSULE | Freq: Every day | ORAL | Status: DC
  Administered 2024-04-23 – 2024-04-27 (×5): 0.4 mg via ORAL
  Filled 2024-04-23 (×5): qty 1

## 2024-04-23 MED ORDER — IOHEXOL 350 MG/ML SOLN
100.0000 mL | Freq: Once | INTRAVENOUS | Status: AC | PRN
  Administered 2024-04-23: 100 mL via INTRAVENOUS

## 2024-04-23 MED ORDER — IPRATROPIUM-ALBUTEROL 0.5-2.5 (3) MG/3ML IN SOLN: 3.0000 mL | Freq: Four times a day (QID) | RESPIRATORY_TRACT | Status: DC | PRN

## 2024-04-23 MED ORDER — ONDANSETRON HCL 4 MG PO TABS: 4.0000 mg | ORAL_TABLET | Freq: Four times a day (QID) | ORAL | Status: DC | PRN

## 2024-04-23 MED ORDER — SODIUM CHLORIDE 0.9 % IV SOLN
2.0000 g | Freq: Once | INTRAVENOUS | Status: AC
  Administered 2024-04-23: 2 g via INTRAVENOUS
  Filled 2024-04-23: qty 20

## 2024-04-23 MED ORDER — HYDROMORPHONE HCL 1 MG/ML IJ SOLN
0.5000 mg | INTRAMUSCULAR | Status: DC | PRN
  Administered 2024-04-23 – 2024-04-25 (×4): 1 mg via INTRAVENOUS
  Filled 2024-04-23 (×4): qty 1

## 2024-04-23 MED ORDER — LACTATED RINGERS IV SOLN: INTRAVENOUS | Status: AC

## 2024-04-23 MED ORDER — ACETAMINOPHEN 325 MG PO TABS
650.0000 mg | ORAL_TABLET | Freq: Four times a day (QID) | ORAL | Status: DC | PRN
  Administered 2024-04-23 – 2024-04-25 (×4): 650 mg via ORAL
  Filled 2024-04-23 (×4): qty 2

## 2024-04-23 NOTE — ED Provider Notes (Signed)
°  Physical Exam  BP 128/78   Pulse (!) 107   Temp 98.4 F (36.9 C)   Resp 18   Ht 5' 9 (1.753 m)   Wt 68 kg   SpO2 95%   BMI 22.15 kg/m   Physical Exam  Procedures  Procedures  ED Course / MDM    Medical Decision Making Amount and/or Complexity of Data Reviewed Labs: ordered. Radiology: ordered.  Risk Prescription drug management. Decision regarding hospitalization.   This patient was signed out to me pending urinalysis.  Urinalysis was performed and does show findings concerning for a UTI.  The patient has already received IV Rocephin  as well has had blood cultures collected and lab work collected.  I discussed the patient's case with hospitalist who is in agreement with plan for admission.       Malissa Slay M, MD 04/24/24 [phone]

## 2024-04-23 NOTE — ED Triage Notes (Addendum)
 Pt to ED for abd, back pain x1 week. Pt reports has been taking meds for back pain and possibly caused bloating. Reports loose stools since taking laxative to get meds out of my system Also reports feels like has PNA, recent cough and congestion

## 2024-04-23 NOTE — Consult Note (Signed)
 CODE SEPSIS - PHARMACY COMMUNICATION  **Broad Spectrum Antibiotics should be administered within 1 hour of Sepsis diagnosis**  Time Code Sepsis Called/Page Received: 1502  Antibiotics Ordered: rocephin    Time of 1st antibiotic administration: 1528  Additional action taken by pharmacy: none  If necessary, Name of Provider/Nurse Contacted: n/a    Annabella LOISE Banks ,PharmD Clinical Pharmacist  04/23/2024  3:17 PM

## 2024-04-23 NOTE — H&P (Signed)
 History and Physical    Edward Macias FMW:[id number] DOB: [birth year] DOA: 04/23/2024  PCP: Pcp, No (Confirm with patient/family/NH records and if not entered, this has to be entered at [facility] point of entry) Patient coming from: Home  I have personally briefly reviewed patient's old medical records in [facility]  Chief Complaint: Back pain, fever and chills  HPI: Edward Macias is a 63 y.o. male with medical history significant of HTN, asthma/COPD, presented with worsening of bilateral back pain and fever and chills.  Symptoms started 4 days ago, when patient started to develop bilateral lower back pain, sharp-like, constant and gradually getting worse but denied any nauseous vomiting diarrhea.  Last few days he has had episode of subjective fever and chills and noticed that his urine has turned to pink color.  He denied any dysuria but admitted has been having increased urine frequency.  At baseline he was never diagnosed with BPH however he has a chronic increasing nocturnal urea, broken urine stream, but was never seen by a specialist.  ED Course: Afebrile, pulse rate 107 blood pressure 128/78 O2 saturation 95% room air.  Blood work showed WBC 33.1 hemoglobin 13 BUN 36 creatinine 1.9 compared to baseline 1.9 glucose 129 bicarb 22K 3.7.  CTA chest abdomen pelvis dissection study negative for dissection but showed signs of bilateral pyelonephritis.  Patient was given IV bolus in ED started on ceftriaxone .  Review of Systems: As per HPI otherwise 14 point review of systems negative.    Past Medical History:  Diagnosis Date   Arthritis    Asthma    AS A CHILD-NO INHALERS   Asthma exacerbation 04/09/2019   COPD (chronic obstructive pulmonary disease) (HCC)    Pneumonia    Sepsis (HCC) 07/24/2018    Past Surgical History:  Procedure Laterality Date   INGUINAL HERNIA REPAIR Left 10/03/2016   Procedure: HERNIA REPAIR INGUINAL ADULT;  Surgeon: Claudene Larinda Bolder, MD;  Location: [facility]  ORS;  Service: General;  Laterality: Left;   INSERTION OF MESH Bilateral 03/19/2022   Procedure: INSERTION OF MESH;  Surgeon: Lane Shope, MD;  Location: [facility] ORS;  Service: General;  Laterality: Bilateral;     reports that he has been smoking cigarettes. He has a 39 pack-year smoking history. He has never used smokeless tobacco. He reports current alcohol use. He reports that he does not use drugs.  Allergies[1]  History reviewed. No pertinent family history.   Prior to Admission medications  Medication Sig Start Date End Date Taking? Authorizing Provider  albuterol  (VENTOLIN  HFA) 108 (90 Base) MCG/ACT inhaler Inhale 2 puffs into the lungs every 6 (six) hours as needed for wheezing or shortness of breath. 03/19/24  Yes Kadali, Renuka A, MD  amLODipine  (NORVASC ) 10 MG tablet Take 1 tablet (10 mg total) by mouth daily. Patient not taking: Reported on 04/23/2024 03/06/24   Dorinda Drue DASEN, MD  fluticasone -salmeterol (ADVAIR ) 250-50 MCG/ACT AEPB Inhale 1 puff into the lungs daily. Patient not taking: Reported on 04/23/2024    [provider]    Physical Exam: Vitals:   04/23/24 1241 04/23/24 1242  BP: 128/78   Pulse: (!) 107   Resp: 18   Temp: 98.4 F (36.9 C)   SpO2: 95%   Weight:  68 kg  Height:  5' 9 (1.753 m)    Constitutional: NAD, calm, comfortable Vitals:   04/23/24 1241 04/23/24 1242  BP: 128/78   Pulse: (!) 107   Resp: 18   Temp: 98.4 F (36.9 C)  SpO2: 95%   Weight:  68 kg  Height:  5' 9 (1.753 m)   Eyes: PERRL, lids and conjunctivae normal ENMT: Mucous membranes are dry. Posterior pharynx clear of any exudate or lesions.Normal dentition.  Neck: normal, supple, no masses, no thyromegaly Respiratory: clear to auscultation bilaterally, no wheezing, no crackles. Normal respiratory effort. No accessory muscle use.  Cardiovascular: Regular rate and rhythm, no murmurs / rubs / gallops. No extremity edema. 2+ pedal pulses. No carotid bruits.  Abdomen:  B/L CVA tenderness, no masses palpated. No hepatosplenomegaly. Bowel sounds positive.  Musculoskeletal: no clubbing / cyanosis. No joint deformity upper and lower extremities. Good ROM, no contractures. Normal muscle tone.  Skin: no rashes, lesions, ulcers. No induration Neurologic: CN 2-12 grossly intact. Sensation intact, DTR normal. Strength 5/5 in all 4.  Psychiatric: Normal judgment and insight. Alert and oriented x 3. Normal mood.     Labs on Admission: I have personally reviewed following labs and imaging studies  CBC: Recent Labs  Lab 04/23/24 1244  WBC 33.1*  HGB 13.0  HCT 36.3*  MCV 89.2  PLT 373   Basic Metabolic Panel: Recent Labs  Lab 04/23/24 1244  NA 129*  K 3.7  CL 89*  CO2 22  GLUCOSE 86  BUN 36*  CREATININE 1.97*  CALCIUM 8.7*   GFR: Estimated Creatinine Clearance: 36.4 mL/min (A) (by C-G formula based on SCr of 1.97 mg/dL (H)). Liver Function Tests: Recent Labs  Lab 04/23/24 1244  AST 30  ALT 11  ALKPHOS 156*  BILITOT 0.9  PROT 7.3  ALBUMIN 2.9*   Recent Labs  Lab 04/23/24 1244  LIPASE 46   No results for input(s): AMMONIA in the last 168 hours. Coagulation Profile: No results for input(s): INR, PROTIME in the last 168 hours. Cardiac Enzymes: No results for input(s): CKTOTAL, CKMB, CKMBINDEX, TROPONINI in the last 168 hours. BNP (last 3 results) No results for input(s): PROBNP in the last 8760 hours. HbA1C: No results for input(s): HGBA1C in the last 72 hours. CBG: No results for input(s): GLUCAP in the last 168 hours. Lipid Profile: No results for input(s): CHOL, HDL, LDLCALC, TRIG, CHOLHDL, LDLDIRECT in the last 72 hours. Thyroid Function Tests: No results for input(s): TSH, T4TOTAL, FREET4, T3FREE, THYROIDAB in the last 72 hours. Anemia Panel: No results for input(s): VITAMINB12, FOLATE, FERRITIN, TIBC, IRON, RETICCTPCT in the last 72 hours. Urine analysis:    Component  Value Date/Time   COLORURINE YELLOW (A) 04/23/2024 1519   APPEARANCEUR HAZY (A) 04/23/2024 1519   APPEARANCEUR Clear 05/16/2020 1009   LABSPEC 1.014 04/23/2024 1519   PHURINE 6.0 04/23/2024 1519   GLUCOSEU NEGATIVE 04/23/2024 1519   HGBUR LARGE (A) 04/23/2024 1519   BILIRUBINUR NEGATIVE 04/23/2024 1519   BILIRUBINUR Negative 05/16/2020 1009   KETONESUR NEGATIVE 04/23/2024 1519   PROTEINUR 30 (A) 04/23/2024 1519   NITRITE NEGATIVE 04/23/2024 1519   LEUKOCYTESUR LARGE (A) 04/23/2024 1519    Radiological Exams on Admission: CT Angio Chest/Abd/Pel for Dissection W and/or Wo Contrast Result Date: 04/23/2024 CLINICAL DATA:  Acute aortic syndrome suspected.  Back pain. EXAM: CT ANGIOGRAPHY CHEST, ABDOMEN AND PELVIS TECHNIQUE: Non-contrast CT of the chest was initially obtained. Multidetector CT imaging through the chest, abdomen and pelvis was performed using the standard protocol during bolus administration of intravenous contrast. Multiplanar reconstructed images and MIPs were obtained and reviewed to evaluate the vascular anatomy. RADIATION DOSE REDUCTION: This exam was performed according to the departmental dose-optimization program which includes automated exposure control, adjustment  of the mA and/or kV according to patient size and/or use of iterative reconstruction technique. CONTRAST:  OMNIPAQUE  IOHEXOL  350 MG/ML SOLN COMPARISON:  02/26/2022, 03/17/2024. FINDINGS: CTA CHEST FINDINGS Cardiovascular: The heart is normal in size and there is a small pericardial effusion. Scattered coronary artery calcifications are present. There is atherosclerotic calcification of the aorta without evidence of aneurysm or dissection. The pulmonary trunk is normal in caliber. Mediastinum/Nodes: No enlarged mediastinal, hilar, or axillary lymph nodes. Thyroid gland, trachea, and esophagus demonstrate no significant findings. Lungs/Pleura: There is a small pleural effusion on the right and trace pleural  effusion on the left. Centrilobular emphysematous changes are present in the lungs. Atelectasis is present bilaterally. No pneumothorax is seen. There is a stable 4 mm nodule in the anterior aspect of the right upper lobe, axial image 56. Musculoskeletal: Radiopaque densities are noted in the posterior chest wall, compatible with known shrapnel. Degenerative changes are present in the thoracic spine. No acute osseous abnormality is seen. Review of the MIP images confirms the above findings. CTA ABDOMEN AND PELVIS FINDINGS VASCULAR Aorta: Normal caliber aorta without aneurysm, dissection, vasculitis or significant stenosis. Aortic atherosclerosis. Celiac: Patent without evidence of aneurysm, dissection, vasculitis or significant stenosis. SMA: Patent without evidence of aneurysm, dissection, vasculitis or significant stenosis. Renals: Both renal arteries are patent without evidence of aneurysm, dissection, vasculitis, fibromuscular dysplasia or significant stenosis. IMA: Patent without evidence of aneurysm, dissection, vasculitis or significant stenosis. Inflow: Patent without evidence of aneurysm, dissection, vasculitis or significant stenosis. Veins: No obvious venous abnormality within the limitations of this arterial phase study. Review of the MIP images confirms the above findings. NON-VASCULAR Hepatobiliary: No focal liver abnormality is seen. No gallstones, gallbladder wall thickening, or biliary dilatation. Pancreas: Unremarkable. No pancreatic ductal dilatation or surrounding inflammatory changes. Spleen: Normal in size without focal abnormality. Adrenals/Urinary Tract: The adrenal glands are within normal limits. There is patchy hypoenhancement of the kidneys bilaterally, concerning for pyelonephritis. No renal calculus or hydronephrosis is seen. The bladder is within normal limits. Stomach/Bowel: Stomach is within normal limits. No bowel obstruction, free air, or pneumatosis is seen. Scattered diverticula  are present along the colon without evidence of diverticulitis. Appendix appears normal. Lymphatic: No abdominal or pelvic lymphadenopathy by size criteria. Reproductive: Prostate is unremarkable. Other: No abdominopelvic ascites. Musculoskeletal: Degenerative changes are present in the lumbar spine. No acute osseous abnormality. Scattered radiopaque structures are noted in the abdomen and pelvis, compatible with known shrapnel. Review of the MIP images confirms the above findings. IMPRESSION: 1. Aortic atherosclerosis without evidence of aneurysm or dissection. 2. Marked patchy hypoenhancement of the kidneys bilaterally, compatible with pyelonephritis. No renal calculus or obstructive uropathy is seen. 3. Small pleural effusion on the right and trace pleural effusion on the left with a associated atelectasis. 4. Emphysema. 5. Coronary artery calcifications. Electronically Signed   By: Leita Birmingham M.D.   On: 04/23/2024 14:57    EKG: Ordered  Assessment/Plan Principal Problem:   Pyelonephritis Active Problems:   Acute pyelonephritis  (please populate well all problems here in Problem List. (For example, if patient is on BP meds at home and you resume or decide to hold them, it is a problem that needs to be her. Same for CAD, COPD, HLD and so on)  Sepsis with acute endorgan damage Bilateral pyelonephritis Complicated UTI with hematuria BPH, likely - Sepsis as evidenced by tachycardia, leukocytosis, source infection is bilateral pyonephritis and complicated UTI with hematuria - Likely has some baseline BPH undiagnosed.  Start  Flomax , referred to outpatient urology. - Urine culture ordered - Will give IV bolus x 2 - Continue maintenance IV fluid  AKI - Likely prerenal secondary to sepsis - IV bolus and continue maintenance IV fluid as above.  HTN - Start as needed hydralazine   Asthma/COPD - Denied any breathing symptoms, no wheezing on exam - Continue as needed albuterol   DVT  prophylaxis: SCD Code Status: Full code Family Communication: None at bedside Disposition Plan: [facility] with sepsis, from pyelonephritis requiring IV antibiotics, expect more than 2 midnight hospital stay. Consults called: None Admission status: Telemetry admission   Cort ONEIDA Mana MD [facility] Pager [phone]  04/23/2024, 4:21 PM       [1] No Known Allergies

## 2024-04-23 NOTE — ED Provider Notes (Signed)
 [facility] Provider Note    Event Date/Time   First MD Initiated Contact with Patient 04/23/24 1245     (approximate)   History   Abdominal Pain and Back Pain   HPI  Edward Macias is a 63 y.o. male with a history of sepsis, COPD, pneumonia presents with complaints of mid back pain as well as epigastric and abdominal pain which developed over the last 24 hours, he reports the pain is moderate to severe.  Denies shortness of breath but does have an occasional cough.  No fevers reported.     Physical Exam   Triage Vital Signs: ED Triage Vitals  Encounter Vitals Group     BP 04/23/24 1241 128/78     Girls Systolic BP Percentile --      Girls Diastolic BP Percentile --      Boys Systolic BP Percentile --      Boys Diastolic BP Percentile --      Pulse Rate 04/23/24 1241 (!) 107     Resp 04/23/24 1241 18     Temp 04/23/24 1241 98.4 F (36.9 C)     Temp src --      SpO2 04/23/24 1241 95 %     Weight 04/23/24 1242 68 kg (150 lb)     Height 04/23/24 1242 1.753 m (5' 9)     Head Circumference --      Peak Flow --      Pain Score 04/23/24 1242 1     Pain Loc --      Pain Education --      Exclude from Growth Chart --     Most recent vital signs: Vitals:   04/23/24 1241  BP: 128/78  Pulse: (!) 107  Resp: 18  Temp: 98.4 F (36.9 C)  SpO2: 95%     General: Awake, no distress.  CV:  Good peripheral perfusion.  Tachycardia Resp:  Normal effort.  Clear to auscultation, scattered mild wheezes Abd:  No distention.  Soft, nontender no pulsatile mass Other:  Back: No rash   ED Results / Procedures / Treatments   Labs (all labs ordered are listed, but only abnormal results are displayed) Labs Reviewed  COMPREHENSIVE METABOLIC PANEL WITH GFR - Abnormal; Notable for the following components:      Result Value   Sodium 129 (*)    Chloride 89 (*)    BUN 36 (*)    Creatinine, Ser 1.97 (*)    Calcium 8.7 (*)    Albumin 2.9 (*)    Alkaline  Phosphatase 156 (*)    GFR, Estimated 37 (*)    Anion gap 18 (*)    All other components within normal limits  CBC - Abnormal; Notable for the following components:   WBC 33.1 (*)    RBC 4.07 (*)    HCT 36.3 (*)    All other components within normal limits  CULTURE, BLOOD (ROUTINE X 2)  CULTURE, BLOOD (ROUTINE X 2)  LIPASE, BLOOD  URINALYSIS, ROUTINE W REFLEX MICROSCOPIC  LACTIC ACID, PLASMA     EKG  ED ECG REPORT I, Lamar Price, the attending physician, personally viewed and interpreted this ECG.  Date: 04/23/2024  Rhythm: normal sinus rhythm QRS Axis: normal Intervals: normal ST/T Wave abnormalities: normal Narrative Interpretation: no evidence of acute ischemia    RADIOLOGY CT angio chest abdomen pelvis    PROCEDURES:  Critical Care performed:   Procedures   MEDICATIONS ORDERED IN ED: Medications  lactated ringers  infusion (has no administration in time range)  cefTRIAXone  (ROCEPHIN ) 2 g in sodium chloride  0.9 % 100 mL IVPB (has no administration in time range)  morphine  (PF) 4 MG/ML injection 4 mg (4 mg Intravenous Given 04/23/24 1335)  ondansetron  (ZOFRAN ) injection 4 mg (4 mg Intravenous Given 04/23/24 1335)  iohexol  (OMNIPAQUE ) 350 MG/ML injection 100 mL (100 mLs Intravenous Contrast Given 04/23/24 1415)     IMPRESSION / MDM / ASSESSMENT AND PLAN / ED COURSE  I reviewed the triage vital signs and the nursing notes. Patient's presentation is most consistent with acute presentation with potential threat to life or bodily function.  Patient presents with back pain abdominal pain as described above, he is tachycardic upon arrival  Lab work is concerning for significantly elevated white blood cell count of 33,000.  Differential includes pneumonia, intra-abdominal infection, dissection or AAA  Will send for CT chest and abdomen/pelvis  CT scan demonstrates findings most consistent with pyelonephritis, urine still pending  Given elevated white blood  cell count, mild tachycardia concern for sepsis, will activate code sepsis, lactic pending, blood cultures ordered, will treat with IV Rocephin   Have asked my colleague to follow-up on urinalysis, labs, likely admission to the hospitalist team        FINAL CLINICAL IMPRESSION(S) / ED DIAGNOSES   Final diagnoses:  Sepsis, due to unspecified organism, unspecified whether acute organ dysfunction present [facility])     Rx / DC Orders   ED Discharge Orders     None        Note:  This document was prepared using Dragon voice recognition software and may include unintentional dictation errors.   Arlander Charleston, MD 04/23/24 [phone]

## 2024-04-23 NOTE — Sepsis Progress Note (Signed)
 Sepsis protocol monitored by eLink

## 2024-04-24 DIAGNOSIS — A4151 Sepsis due to Escherichia coli [E. coli]: Secondary | ICD-10-CM | POA: Insufficient documentation

## 2024-04-24 DIAGNOSIS — E876 Hypokalemia: Secondary | ICD-10-CM | POA: Insufficient documentation

## 2024-04-24 DIAGNOSIS — N179 Acute kidney failure, unspecified: Secondary | ICD-10-CM | POA: Insufficient documentation

## 2024-04-24 DIAGNOSIS — E871 Hypo-osmolality and hyponatremia: Secondary | ICD-10-CM | POA: Insufficient documentation

## 2024-04-24 LAB — BLOOD CULTURE ID PANEL (REFLEXED) - BCID2

## 2024-04-24 LAB — CBC
HCT: 31 % — ABNORMAL LOW (ref 39.0–52.0)
Hemoglobin: 11.1 g/dL — ABNORMAL LOW (ref 13.0–17.0)
MCH: 31.8 pg (ref 26.0–34.0)
MCHC: 35.8 g/dL (ref 30.0–36.0)
MCV: 88.8 fL (ref 80.0–100.0)
Platelets: 374 K/uL (ref 150–400)
RBC: 3.49 MIL/uL — ABNORMAL LOW (ref 4.22–5.81)
RDW: 15.4 % (ref 11.5–15.5)
WBC: 30.2 K/uL — ABNORMAL HIGH (ref 4.0–10.5)
nRBC: 0 % (ref 0.0–0.2)

## 2024-04-24 LAB — BASIC METABOLIC PANEL WITH GFR
Anion gap: 15 (ref 5–15)
BUN: 33 mg/dL — ABNORMAL HIGH (ref 8–23)
CO2: 22 mmol/L (ref 22–32)
Calcium: 8.3 mg/dL — ABNORMAL LOW (ref 8.9–10.3)
Chloride: 93 mmol/L — ABNORMAL LOW (ref 98–111)
Creatinine, Ser: 1.94 mg/dL — ABNORMAL HIGH (ref 0.61–1.24)
GFR, Estimated: 38 mL/min — ABNORMAL LOW (ref >60)
Glucose, Bld: 102 mg/dL — ABNORMAL HIGH (ref 70–99)
Potassium: 3.2 mmol/L — ABNORMAL LOW (ref 3.5–5.1)
Sodium: 131 mmol/L — ABNORMAL LOW (ref 135–145)

## 2024-04-24 MED ORDER — POTASSIUM CHLORIDE IN NACL 20-0.9 MEQ/L-% IV SOLN
INTRAVENOUS | Status: DC
  Filled 2024-04-24 (×2): qty 1000

## 2024-04-24 MED ORDER — POTASSIUM CHLORIDE CRYS ER 20 MEQ PO TBCR
40.0000 meq | EXTENDED_RELEASE_TABLET | ORAL | Status: AC
  Administered 2024-04-24 (×2): 40 meq via ORAL
  Filled 2024-04-24 (×2): qty 2

## 2024-04-24 NOTE — Plan of Care (Signed)
  Problem: Clinical Measurements: Goal: Ability to maintain clinical measurements within normal limits will improve Outcome: Progressing   Problem: Activity: Goal: Risk for activity intolerance will decrease Outcome: Progressing   Problem: Elimination: Goal: Will not experience complications related to urinary retention Outcome: Progressing   Problem: Skin Integrity: Goal: Risk for impaired skin integrity will decrease Outcome: Progressing

## 2024-04-24 NOTE — Progress Notes (Signed)
 PHARMACY - PHYSICIAN COMMUNICATION CRITICAL VALUE ALERT - BLOOD CULTURE IDENTIFICATION (BCID)  Results for orders placed or performed during the hospital encounter of 04/23/24  Blood culture (routine x 2)     Status: None (Preliminary result)   Collection Time: 04/23/24  3:02 PM   Specimen: BLOOD  Result Value Ref Range Status   Specimen Description BLOOD RIGHT ANTECUBITAL  Final   Special Requests   Final    BOTTLES DRAWN AEROBIC AND ANAEROBIC Blood Culture adequate volume   Culture  Setup Time   Final    Organism ID to follow GRAM NEGATIVE RODS AEROBIC BOTTLE ONLY Performed at [facility], [address] [place], [place] [address]    Culture GRAM NEGATIVE RODS  Final   Report Status PENDING  Incomplete  Blood culture (routine x 2)     Status: None (Preliminary result)   Collection Time: 04/23/24  3:07 PM   Specimen: BLOOD  Result Value Ref Range Status   Specimen Description BLOOD BLOOD LEFT HAND  Final   Special Requests   Final    BOTTLES DRAWN AEROBIC AND ANAEROBIC Blood Culture adequate volume   Culture   Final    NO GROWTH < 24 HOURS Performed at [facility], [address] [place], [place] [address]    Report Status PENDING  Incomplete    BCID Results: 1 (aerobic) of 4 bottles w/ E. Coli, no resistance.  Pt currently on Ceftriaxone  2 gm q24h.  Name of provider contacted: DOROTHA Peaches, MD   Changes to prescribed antibiotics required: No changes needed at this time.  Rankin CANDIE Dills, PharmD, [facility] 04/24/2024 6:28 AM

## 2024-04-24 NOTE — Plan of Care (Signed)
  Problem: Health Behavior/Discharge Planning: Goal: Ability to manage health-related needs will improve Outcome: Progressing   Problem: Clinical Measurements: Goal: Will remain free from infection Outcome: Progressing   Problem: Activity: Goal: Risk for activity intolerance will decrease Outcome: Progressing   Problem: Coping: Goal: Level of anxiety will decrease Outcome: Progressing   Problem: Pain Managment: Goal: General experience of comfort will improve and/or be controlled Outcome: Progressing   Problem: Skin Integrity: Goal: Risk for impaired skin integrity will decrease Outcome: Progressing

## 2024-04-24 NOTE — Progress Notes (Signed)
°   04/24/24 1818  Vitals  Temp (!) 102.1 F (38.9 C)  Temp Source Oral  BP (!) 146/83  MAP (mmHg) 99  BP Location Right Arm  BP Method Automatic  Patient Position (if appropriate) Lying  Pulse Rate (!) 120  Pulse Rate Source Dinamap  Level of Consciousness  Level of Consciousness Alert  MEWS COLOR  MEWS Score Color Red  Oxygen Therapy  SpO2 100 %  O2 Device Nasal Cannula  O2 Flow Rate (L/min) 2 L/min  MEWS Score  MEWS Temp 2  MEWS Systolic 0  MEWS Pulse 2  MEWS RR 0  MEWS LOC 0  MEWS Score 4   Pt states, I feel the way I felt when I first got here. Will give Tylenol .

## 2024-04-24 NOTE — Progress Notes (Addendum)
°   04/24/24 0833  Vitals  Temp (!) 101.6 F (38.7 C)  Temp Source Oral  BP (!) 154/89  MAP (mmHg) 107  BP Location Right Arm  BP Method Automatic  Patient Position (if appropriate) Lying  Pulse Rate (!) 122  Pulse Rate Source Monitor  Resp 18  MEWS COLOR  MEWS Score Color Red  Oxygen Therapy  SpO2 90 %  MEWS Score  MEWS Temp 2  MEWS Systolic 0  MEWS Pulse 2  MEWS RR 0  MEWS LOC 0  MEWS Score 4   Pt feel general malaise, SHOB, back pain 4/10. Placed on 2L O2 and oxygen increased to 90%. When arrived to pt room O2 sat was 73-80%. PRN Tylenol  650mg  given.

## 2024-04-24 NOTE — Hospital Course (Signed)
 Edward Macias is a 63 y.o. male with medical history significant of HTN, asthma/COPD, presented with worsening of bilateral back pain and fever and chills.  Patient also had dysuria and frequent urination. He had fever up with a Tmax of 101.6, tachycardia and tachypnea.  Leukocytosis of 33.1.  Acute kidney injury of 1.97. Urine abnormal, urine culture sent out.  Blood culture grew E. coli. Patient has been giving fluids, he is also receiving Rocephin .

## 2024-04-24 NOTE — Progress Notes (Signed)
°  Progress Note   Patient: Edward Macias FMW:[id number] DOB: 14-Dec-1960 DOA: 04/23/2024     1 DOS: the patient was seen and examined on 04/24/2024   Brief hospital course: Edward Macias is a 63 y.o. male with medical history significant of HTN, asthma/COPD, presented with worsening of bilateral back pain and fever and chills.  Patient also had dysuria and frequent urination. He had fever up with a Tmax of 101.6, tachycardia and tachypnea.  Leukocytosis of 33.1.  Acute kidney injury of 1.97. Urine abnormal, urine culture sent out.  Blood culture grew E. coli. Patient has been giving fluids, he is also receiving Rocephin .   Principal Problem:   Pyelonephritis Active Problems:   Severe sepsis (HCC)   COPD (chronic obstructive pulmonary disease) (HCC)   Acute pyelonephritis   Hyponatremia   Hypokalemia   AKI (acute kidney injury)   E. coli septicemia (HCC)   Assessment and Plan: Severe sepsis with E. coli septicemia secondary to pyelonephritis. Bilateral pyelonephritis secondary to E. coli. Acute cystitis secondary to E. coli. Patient met severe sepsis criteria with tachycardia, high fever, severe leukocytosis and acute renal failure. This is a secondary to acute pyelonephritis, blood culture also growing E. coli. Patient still has significant tachycardia, fever Hexim down.  I will continue fluids.  Continue Rocephin  while pending final culture results.  Acute kidney injury secondary to sepsis. Hyponatremia. Hypokalemia. Replete potassium, restart normal saline with 20 mEq of KCl, monitor renal function. Patient still has significant dysuria, obtain bladder scan to decide on residual to see if patient has any urinary tension. CT abdomen/pelvis did not show any hydronephrosis.  Essential hypertension. Continue to monitor.  COPD. No exacerbation.      Subjective:  Patient still has some dysuria, back pain.  No longer has a fever today.  No shortness of breath.  Physical  Exam: Vitals:   04/24/24 0438 04/24/24 0833 04/24/24 0951 04/24/24 1115  BP: (!) 160/94 (!) 154/89 122/79 (!) 141/84  Pulse: (!) 110 (!) 122 (!) 105 (!) 104  Resp: 18 18    Temp: 99.9 F (37.7 C) (!) 101.6 F (38.7 C) 100 F (37.8 C) 98.7 F (37.1 C)  TempSrc:  Oral Oral Oral  SpO2: (!) 84% 90% 98% 94%  Weight:      Height:       General exam: Appears calm and comfortable  Respiratory system: Clear to auscultation. Respiratory effort normal. Cardiovascular system: Regular with tachycardia. No JVD, murmurs, rubs, gallops or clicks. No pedal edema. Gastrointestinal system: Abdomen is nondistended, soft and nontender. No organomegaly or masses felt. Normal bowel sounds heard. Central nervous system: Alert and oriented x3. No focal neurological deficits. Extremities: Symmetric 5 x 5 power. Skin: No rashes, lesions or ulcers Psychiatry: Judgement and insight appear normal. Mood & affect appropriate.    Data Reviewed:  Reviewed CT scan results and lab results.  Family Communication: None  Disposition: Status is: Inpatient Remains inpatient appropriate because: Severity of disease, IV antibiotics.     Time spent: 55 minutes  Author: Murvin Mana, MD 04/24/2024 11:31 AM  For on call review [email or web address].

## 2024-04-25 ENCOUNTER — Inpatient Hospital Stay

## 2024-04-25 DIAGNOSIS — B9620 Unspecified Escherichia coli [E. coli] as the cause of diseases classified elsewhere: Secondary | ICD-10-CM

## 2024-04-25 DIAGNOSIS — N12 Tubulo-interstitial nephritis, not specified as acute or chronic: Secondary | ICD-10-CM | POA: Diagnosis not present

## 2024-04-25 LAB — CBC
HCT: 32.1 % — ABNORMAL LOW (ref 39.0–52.0)
Hemoglobin: 11.4 g/dL — ABNORMAL LOW (ref 13.0–17.0)
MCH: 31.4 pg (ref 26.0–34.0)
MCHC: 35.5 g/dL (ref 30.0–36.0)
MCV: 88.4 fL (ref 80.0–100.0)
Platelets: 375 K/uL (ref 150–400)
RBC: 3.63 MIL/uL — ABNORMAL LOW (ref 4.22–5.81)
RDW: 15.7 % — ABNORMAL HIGH (ref 11.5–15.5)
WBC: 24.5 K/uL — ABNORMAL HIGH (ref 4.0–10.5)
nRBC: 0 % (ref 0.0–0.2)

## 2024-04-25 LAB — BASIC METABOLIC PANEL WITH GFR
Anion gap: 14 (ref 5–15)
BUN: 25 mg/dL — ABNORMAL HIGH (ref 8–23)
CO2: 18 mmol/L — ABNORMAL LOW (ref 22–32)
Calcium: 8.8 mg/dL — ABNORMAL LOW (ref 8.9–10.3)
Chloride: 95 mmol/L — ABNORMAL LOW (ref 98–111)
Creatinine, Ser: 1.71 mg/dL — ABNORMAL HIGH (ref 0.61–1.24)
GFR, Estimated: 44 mL/min — ABNORMAL LOW (ref >60)
Glucose, Bld: 97 mg/dL (ref 70–99)
Potassium: 4.6 mmol/L (ref 3.5–5.1)
Sodium: 127 mmol/L — ABNORMAL LOW (ref 135–145)

## 2024-04-25 LAB — MAGNESIUM: Magnesium: 1.9 mg/dL (ref 1.7–2.4)

## 2024-04-25 MED ORDER — LACTULOSE 10 GM/15ML PO SOLN
20.0000 g | Freq: Once | ORAL | Status: AC
  Administered 2024-04-25: 11:00:00 20 g via ORAL
  Filled 2024-04-25: qty 30

## 2024-04-25 MED ORDER — FLEET ENEMA RE ENEM
1.0000 | ENEMA | Freq: Once | RECTAL | Status: AC
  Administered 2024-04-25: 16:00:00 1 via RECTAL

## 2024-04-25 MED ORDER — ALBUTEROL SULFATE (2.5 MG/3ML) 0.083% IN NEBU
2.5000 mg | INHALATION_SOLUTION | RESPIRATORY_TRACT | Status: DC | PRN
  Filled 2024-04-25: qty 3

## 2024-04-25 MED ORDER — OXYCODONE-ACETAMINOPHEN 5-325 MG PO TABS: 1.0000 | ORAL_TABLET | ORAL | Status: DC | PRN

## 2024-04-25 MED ORDER — STERILE WATER FOR INJECTION IV SOLN
INTRAVENOUS | Status: DC
  Filled 2024-04-25: qty 150
  Filled 2024-04-25 (×2): qty 1000

## 2024-04-25 MED ORDER — PANTOPRAZOLE SODIUM 40 MG PO TBEC
40.0000 mg | DELAYED_RELEASE_TABLET | Freq: Every day | ORAL | Status: DC
  Administered 2024-04-25 – 2024-04-26 (×2): 40 mg via ORAL
  Filled 2024-04-25 (×2): qty 1

## 2024-04-25 MED ORDER — IPRATROPIUM-ALBUTEROL 0.5-2.5 (3) MG/3ML IN SOLN
3.0000 mL | Freq: Four times a day (QID) | RESPIRATORY_TRACT | Status: DC
  Administered 2024-04-25 – 2024-04-26 (×4): 3 mL via RESPIRATORY_TRACT
  Filled 2024-04-25 (×5): qty 3

## 2024-04-25 NOTE — Plan of Care (Signed)
°  Problem: Education: Goal: Knowledge of General Education information will improve Description: Including pain rating scale, medication(s)/side effects and non-pharmacologic comfort measures Outcome: Progressing   Problem: Clinical Measurements: Goal: Ability to maintain clinical measurements within normal limits will improve Outcome: Progressing Goal: Will remain free from infection Outcome: Progressing Goal: Diagnostic test results will improve Outcome: Progressing Goal: Respiratory complications will improve Outcome: Progressing Goal: Cardiovascular complication will be avoided Outcome: Progressing   Problem: Coping: Goal: Level of anxiety will decrease Outcome: Progressing   Problem: Nutrition: Goal: Adequate nutrition will be maintained Outcome: Progressing   Problem: Activity: Goal: Risk for activity intolerance will decrease Outcome: Progressing

## 2024-04-25 NOTE — TOC CM/SW Note (Signed)
 Transition of Care [facility] - Inpatient Brief Assessment   Patient Details  Name: Merrit Friesen MRN: [id number] Date of Birth: 1960/12/03  Transition of Care [facility]) CM/SW Contact:    Daved JONETTA Hamilton, RN Phone Number: 04/25/2024, 9:39 AM   Clinical Narrative:   Transition of Care (TOC) Screening Note   Patient Details  Name: Al Bracewell Date of Birth: 30-Jun-1960   Transition of Care [facility]) CM/SW Contact:    Daved JONETTA Hamilton, RN Phone Number: 04/25/2024, 9:39 AM  PCP resources have been added to AVS  Transition of Care Department [facility]) has reviewed patient and no TOC needs have been identified at this time. If new patient transition needs arise, please place a TOC consult.    Transition of Care Asessment: Insurance and Status: Insurance coverage has been reviewed Patient has primary care physician: No (Resources added to AVS)   Prior level of function:: Independent Prior/Current Home Services: No current home services Social Drivers of Health Review: SDOH reviewed interventions complete Readmission risk has been reviewed: Yes Transition of care needs: no transition of care needs at this time

## 2024-04-25 NOTE — Consult Note (Signed)
 Infectious Disease     Reason for Consul Pyelonephritis    Referring Physician: Laurita Pillion, MD  Date of Admission:  04/23/2024   Principal Problem:   Pyelonephritis Active Problems:   Severe sepsis (HCC)   COPD (chronic obstructive pulmonary disease) (HCC)   Acute pyelonephritis   Hyponatremia   Hypokalemia   AKI (acute kidney injury)   E. coli septicemia (HCC)   HPI: Edward Macias is a 63 y.o. male with a history of hypertension asthma COPD admitted with back pain fevers and chills and dysuria.  On admission December 13 he was afebrile but spiked temperatures to 101.7.  His white count initially was 33.  On admit his creatinine was 1.94 LFTs were normal..  Urine sample showed 50 white cells. Urine cultures are growing greater than 100,000 E. coli.  Blood cultures 1 set positive for E. coli.  HIV was negative in October He had a CTangio chest abdomen pelvis December 13 that showed aortic arteriosclerosis, podiatry hypoenhancement of the kidneys bilaterally compatible with pyelonephritis but no obstruction or stones.  Small pleural effusions on the right and trace on the left and Emphysema He was started on ceftriaxone .  We are consulted because he continues to have some fevers. Today he reports he is feeling significantly better.  He says he was a 10 out of 10 as how ill he was when he came in he is now down to 6 out of 10.  Still feels a little achy. Past Medical History:  Diagnosis Date   Arthritis    Asthma    AS A CHILD-NO INHALERS   Asthma exacerbation 04/09/2019   COPD (chronic obstructive pulmonary disease) (HCC)    Pneumonia    Sepsis (HCC) 07/24/2018   Past Surgical History:  Procedure Laterality Date   INGUINAL HERNIA REPAIR Left 10/03/2016   Procedure: HERNIA REPAIR INGUINAL ADULT;  Surgeon: Claudene Larinda Bolder, MD;  Location: [facility] ORS;  Service: General;  Laterality: Left;   INSERTION OF MESH Bilateral 03/19/2022   Procedure: INSERTION OF MESH;  Surgeon: Lane Shope, MD;  Location: [facility] OR[facility];  Service: General;  Laterality: Bilateral;   Social History[1] History reviewed. No pertinent family history.  Allergies: Allergies[2]  Current antibiotics: Antibiotics Given (last 72 hours)     Date/Time Action Medication Dose Rate   04/23/24 1528 New Bag/Given   cefTRIAXone  (ROCEPHIN ) 2 g in sodium chloride  0.9 % 100 mL IVPB 2 g 200 mL/hr   04/24/24 0845 New Bag/Given   cefTRIAXone  (ROCEPHIN ) 2 g in sodium chloride  0.9 % 100 mL IVPB 2 g 200 mL/hr   04/25/24 1100 New Bag/Given   cefTRIAXone  (ROCEPHIN ) 2 g in sodium chloride  0.9 % 100 mL IVPB 2 g 200 mL/hr       MEDICATIONS:  ipratropium-albuterol   3 mL Nebulization Q6H   pantoprazole   40 mg Oral Daily   sodium phosphate   1 enema Rectal Once   tamsulosin   0.4 mg Oral QPC supper    Review of Systems - 11 systems reviewed and negative per HPI   OBJECTIVE: Temp:  [98.6 F (37 C)-102.1 F (38.9 C)] 100 F (37.8 C) (12/15 1139) Pulse Rate:  [101-123] 106 (12/15 1139) Resp:  [17-18] 18 (12/15 0450) BP: (130-158)/(63-100) 132/63 (12/15 1139) SpO2:  [96 %-100 %] 98 % (12/15 1139) Physical Exam  Constitutional: He is oriented to person, place, and time. He appears well-developed and well-nourished. No distress.  HENT:  Mouth/Throat: Oropharynx is clear and moist. No oropharyngeal exudate.  Cardiovascular: Normal  rate, regular rhythm and normal heart sounds. Exam reveals no gallop and no friction rub.  No murmur heard.  Pulmonary/Chest: Effort normal and breath sounds normal. No respiratory distress. He has no wheezes.  Abdominal: Soft. Bowel sounds are normal. He exhibits no distension. There is no tenderness.  Mild CVA tenderness Lymphadenopathy:  He has no cervical adenopathy.  Neurological: He is alert and oriented to person, place, and time.  Skin: Skin is warm and dry. No rash noted. No erythema.  Psychiatric: He has a normal mood and affect. His behavior is normal.      LABS: Results for orders placed or performed during the hospital encounter of 04/23/24 (from the past 48 hours)  Blood culture (routine x 2)     Status: Abnormal (Preliminary result)   Collection Time: 04/23/24  3:02 PM   Specimen: BLOOD  Result Value Ref Range   Specimen Description      BLOOD RIGHT ANTECUBITAL Performed at [facility], [address] [place], [place] [address]    Special Requests      BOTTLES DRAWN AEROBIC AND ANAEROBIC Blood Culture adequate volume Performed at [facility], [address] [place], [place] [address]    Culture  Setup Time      GRAM NEGATIVE RODS AEROBIC BOTTLE ONLY CRITICAL RESULT CALLED TO, READ BACK BY AND VERIFIED WITH: NATHAN B., PHARM D AT 9371 04/24/24 RAM GRAM STAIN REVIEWED-AGREE WITH RESULT    Culture (A)     ESCHERICHIA COLI SUSCEPTIBILITIES TO FOLLOW Performed at [facility], [address], [place], [place] [address]    Report Status PENDING   Lactic acid, plasma     Status: None   Collection Time: 04/23/24  3:02 PM  Result Value Ref Range   Lactic Acid, Venous 1.3 0.5 - 1.9 mmol/L    Comment: Performed at [facility], [address] [place], [place] [address]  Blood Culture ID Panel (Reflexed)     Status: Abnormal   Collection Time: 04/23/24  3:02 PM  Result Value Ref Range   Enterococcus faecalis NOT DETECTED NOT DETECTED   Enterococcus Faecium NOT DETECTED NOT DETECTED   Listeria monocytogenes NOT DETECTED NOT DETECTED   Staphylococcus species NOT DETECTED NOT DETECTED   Staphylococcus aureus (BCID) NOT DETECTED NOT DETECTED   Staphylococcus epidermidis NOT DETECTED NOT DETECTED   Staphylococcus lugdunensis NOT DETECTED NOT DETECTED   Streptococcus species NOT DETECTED NOT DETECTED   Streptococcus agalactiae NOT DETECTED NOT DETECTED   Streptococcus pneumoniae NOT DETECTED NOT DETECTED   Streptococcus pyogenes NOT DETECTED NOT DETECTED   A.calcoaceticus-baumannii NOT DETECTED  NOT DETECTED   Bacteroides fragilis NOT DETECTED NOT DETECTED   Enterobacterales DETECTED (A) NOT DETECTED    Comment: Enterobacterales represent a large order of gram negative bacteria, not a single organism. CRITICAL RESULT CALLED TO, READ BACK BY AND VERIFIED WITH: RANKIN WENDI GOWER D AT 9371 04/24/24 RAM    Enterobacter cloacae complex NOT DETECTED NOT DETECTED   Escherichia coli DETECTED (A) NOT DETECTED    Comment: CRITICAL RESULT CALLED TO, READ BACK BY AND VERIFIED WITH: RANKIN WENDI GOWER D AT 0628 04/24/24 RAM    Klebsiella aerogenes NOT DETECTED NOT DETECTED   Klebsiella oxytoca NOT DETECTED NOT DETECTED   Klebsiella pneumoniae NOT DETECTED NOT DETECTED   Proteus species NOT DETECTED NOT DETECTED   Salmonella species NOT DETECTED NOT DETECTED   Serratia marcescens NOT DETECTED NOT DETECTED   Haemophilus influenzae NOT DETECTED NOT DETECTED  Neisseria meningitidis NOT DETECTED NOT DETECTED   Pseudomonas aeruginosa NOT DETECTED NOT DETECTED   Stenotrophomonas maltophilia NOT DETECTED NOT DETECTED   Candida albicans NOT DETECTED NOT DETECTED   Candida auris NOT DETECTED NOT DETECTED   Candida glabrata NOT DETECTED NOT DETECTED   Candida krusei NOT DETECTED NOT DETECTED   Candida parapsilosis NOT DETECTED NOT DETECTED   Candida tropicalis NOT DETECTED NOT DETECTED   Cryptococcus neoformans/gattii NOT DETECTED NOT DETECTED   CTX-M ESBL NOT DETECTED NOT DETECTED   Carbapenem resistance IMP NOT DETECTED NOT DETECTED   Carbapenem resistance KPC NOT DETECTED NOT DETECTED   Carbapenem resistance NDM NOT DETECTED NOT DETECTED   Carbapenem resist OXA 48 LIKE NOT DETECTED NOT DETECTED   Carbapenem resistance VIM NOT DETECTED NOT DETECTED    Comment: Performed at [facility], [address] [place], [place] [address]  Blood culture (routine x 2)     Status: None (Preliminary result)   Collection Time: 04/23/24  3:07 PM   Specimen: BLOOD  Result Value Ref Range    Specimen Description BLOOD BLOOD LEFT HAND    Special Requests      BOTTLES DRAWN AEROBIC AND ANAEROBIC Blood Culture adequate volume   Culture      NO GROWTH 2 DAYS Performed at [facility], [address] [place], [place] [address]    Report Status PENDING   Urinalysis, Routine w reflex microscopic -Urine, Clean Catch     Status: Abnormal   Collection Time: 04/23/24  3:19 PM  Result Value Ref Range   Color, Urine YELLOW (A) YELLOW   APPearance HAZY (A) CLEAR   Specific Gravity, Urine 1.014 1.005 - 1.030   pH 6.0 5.0 - 8.0   Glucose, UA NEGATIVE NEGATIVE mg/dL   Hgb urine dipstick LARGE (A) NEGATIVE   Bilirubin Urine NEGATIVE NEGATIVE   Ketones, ur NEGATIVE NEGATIVE mg/dL   Protein, ur 30 (A) NEGATIVE mg/dL   Nitrite NEGATIVE NEGATIVE   Leukocytes,Ua LARGE (A) NEGATIVE   RBC / HPF >50 0 - 5 RBC/hpf   WBC, UA >50 0 - 5 WBC/hpf   Bacteria, UA RARE (A) NONE SEEN   Squamous Epithelial / HPF 0 0 - 5 /HPF   Mucus PRESENT     Comment: Performed at [facility], [address], [place], [place] [address]  Urine Culture (for pregnant, neutropenic or urologic patients or patients with an indwelling urinary catheter)     Status: Abnormal (Preliminary result)   Collection Time: 04/23/24  3:19 PM   Specimen: Urine, Random  Result Value Ref Range   Specimen Description      URINE, RANDOM Performed at [facility], [address] [place], [place] [address]    Special Requests      NONE Performed at [facility], [address] [place], [place] [address]    Culture (A)     >=100,000 COLONIES/mL ESCHERICHIA COLI SUSCEPTIBILITIES TO FOLLOW Performed at [facility], [address] [place], [place] [address]    Report Status PENDING   CBC     Status: Abnormal   Collection Time: 04/24/24  6:24 AM  Result Value Ref Range   WBC 30.2 (H) 4.0 - 10.5 K/uL   RBC 3.49 (L) 4.22 - 5.81 MIL/uL   Hemoglobin 11.1 (L) 13.0 - 17.0 g/dL   HCT 68.9  (L) 60.9 - 52.0 %   MCV 88.8 80.0 - 100.0 fL   MCH 31.8 26.0 - 34.0 pg  MCHC 35.8 30.0 - 36.0 g/dL   RDW 84.5 88.4 - 84.4 %   Platelets 374 150 - 400 K/uL   nRBC 0.0 0.0 - 0.2 %    Comment: Performed at [facility], [address] [place], [place] [address]  Basic metabolic panel     Status: Abnormal   Collection Time: 04/24/24  6:24 AM  Result Value Ref Range   Sodium 131 (L) 135 - 145 mmol/L   Potassium 3.2 (L) 3.5 - 5.1 mmol/L   Chloride 93 (L) 98 - 111 mmol/L   CO2 22 22 - 32 mmol/L   Glucose, Bld 102 (H) 70 - 99 mg/dL    Comment: Glucose reference range applies only to samples taken after fasting for at least 8 hours.   BUN 33 (H) 8 - 23 mg/dL   Creatinine, Ser 8.05 (H) 0.61 - 1.24 mg/dL   Calcium 8.3 (L) 8.9 - 10.3 mg/dL   GFR, Estimated 38 (L) >60 mL/min    Comment: (NOTE) Calculated using the CKD-EPI Creatinine Equation (2021)    Anion gap 15 5 - 15    Comment: Performed at [facility], [address] [place], [place] [address]  Basic metabolic panel with GFR     Status: Abnormal   Collection Time: 04/25/24  6:06 AM  Result Value Ref Range   Sodium 127 (L) 135 - 145 mmol/L   Potassium 4.6 3.5 - 5.1 mmol/L   Chloride 95 (L) 98 - 111 mmol/L   CO2 18 (L) 22 - 32 mmol/L   Glucose, Bld 97 70 - 99 mg/dL    Comment: Glucose reference range applies only to samples taken after fasting for at least 8 hours.   BUN 25 (H) 8 - 23 mg/dL   Creatinine, Ser 8.28 (H) 0.61 - 1.24 mg/dL   Calcium 8.8 (L) 8.9 - 10.3 mg/dL   GFR, Estimated 44 (L) >60 mL/min    Comment: (NOTE) Calculated using the CKD-EPI Creatinine Equation (2021)    Anion gap 14 5 - 15    Comment: Performed at [facility], [address] [place], [place] [address]  Magnesium      Status: None   Collection Time: 04/25/24  6:06 AM  Result Value Ref Range   Magnesium  1.9 1.7 - 2.4 mg/dL    Comment: Performed at [facility], [address] [place], [place] [address]  CBC      Status: Abnormal   Collection Time: 04/25/24  6:06 AM  Result Value Ref Range   WBC 24.5 (H) 4.0 - 10.5 K/uL   RBC 3.63 (L) 4.22 - 5.81 MIL/uL   Hemoglobin 11.4 (L) 13.0 - 17.0 g/dL   HCT 67.8 (L) 60.9 - 47.9 %   MCV 88.4 80.0 - 100.0 fL   MCH 31.4 26.0 - 34.0 pg   MCHC 35.5 30.0 - 36.0 g/dL   RDW 84.2 (H) 88.4 - 84.4 %   Platelets 375 150 - 400 K/uL   nRBC 0.0 0.0 - 0.2 %    Comment: Performed at [facility], [address] [place], [place] [address]   No components found for: ESR, C REACTIVE PROTEIN MICRO: Recent Results (from the past 720 hours)  Blood culture (routine x 2)     Status: Abnormal (Preliminary result)   Collection Time: 04/23/24  3:02 PM   Specimen: BLOOD  Result Value Ref Range Status   Specimen Description   Final    BLOOD RIGHT ANTECUBITAL Performed at  [facility], [address], [place], [place] [address]    Special Requests   Final    BOTTLES DRAWN AEROBIC AND ANAEROBIC Blood Culture adequate volume Performed at [facility], [address] [place], [place] [address]    Culture  Setup Time   Final    GRAM NEGATIVE RODS AEROBIC BOTTLE ONLY CRITICAL RESULT CALLED TO, READ BACK BY AND VERIFIED WITH: NATHAN B., PHARM D AT 9371 04/24/24 RAM GRAM STAIN REVIEWED-AGREE WITH RESULT    Culture (A)  Final    ESCHERICHIA COLI SUSCEPTIBILITIES TO FOLLOW Performed at [facility], [address] [place], [place] [address]    Report Status PENDING  Incomplete  Blood Culture ID Panel (Reflexed)     Status: Abnormal   Collection Time: 04/23/24  3:02 PM  Result Value Ref Range Status   Enterococcus faecalis NOT DETECTED NOT DETECTED Final   Enterococcus Faecium NOT DETECTED NOT DETECTED Final   Listeria monocytogenes NOT DETECTED NOT DETECTED Final   Staphylococcus species NOT DETECTED NOT DETECTED Final   Staphylococcus aureus (BCID) NOT DETECTED NOT DETECTED Final   Staphylococcus epidermidis NOT DETECTED NOT  DETECTED Final   Staphylococcus lugdunensis NOT DETECTED NOT DETECTED Final   Streptococcus species NOT DETECTED NOT DETECTED Final   Streptococcus agalactiae NOT DETECTED NOT DETECTED Final   Streptococcus pneumoniae NOT DETECTED NOT DETECTED Final   Streptococcus pyogenes NOT DETECTED NOT DETECTED Final   A.calcoaceticus-baumannii NOT DETECTED NOT DETECTED Final   Bacteroides fragilis NOT DETECTED NOT DETECTED Final   Enterobacterales DETECTED (A) NOT DETECTED Final    Comment: Enterobacterales represent a large order of gram negative bacteria, not a single organism. CRITICAL RESULT CALLED TO, READ BACK BY AND VERIFIED WITH: RANKIN WENDI GOWER D AT 9371 04/24/24 RAM    Enterobacter cloacae complex NOT DETECTED NOT DETECTED Final   Escherichia coli DETECTED (A) NOT DETECTED Final    Comment: CRITICAL RESULT CALLED TO, READ BACK BY AND VERIFIED WITH: RANKIN WENDI GOWER D AT 0628 04/24/24 RAM    Klebsiella aerogenes NOT DETECTED NOT DETECTED Final   Klebsiella oxytoca NOT DETECTED NOT DETECTED Final   Klebsiella pneumoniae NOT DETECTED NOT DETECTED Final   Proteus species NOT DETECTED NOT DETECTED Final   Salmonella species NOT DETECTED NOT DETECTED Final   Serratia marcescens NOT DETECTED NOT DETECTED Final   Haemophilus influenzae NOT DETECTED NOT DETECTED Final   Neisseria meningitidis NOT DETECTED NOT DETECTED Final   Pseudomonas aeruginosa NOT DETECTED NOT DETECTED Final   Stenotrophomonas maltophilia NOT DETECTED NOT DETECTED Final   Candida albicans NOT DETECTED NOT DETECTED Final   Candida auris NOT DETECTED NOT DETECTED Final   Candida glabrata NOT DETECTED NOT DETECTED Final   Candida krusei NOT DETECTED NOT DETECTED Final   Candida parapsilosis NOT DETECTED NOT DETECTED Final   Candida tropicalis NOT DETECTED NOT DETECTED Final   Cryptococcus neoformans/gattii NOT DETECTED NOT DETECTED Final   CTX-M ESBL NOT DETECTED NOT DETECTED Final   Carbapenem resistance IMP NOT  DETECTED NOT DETECTED Final   Carbapenem resistance KPC NOT DETECTED NOT DETECTED Final   Carbapenem resistance NDM NOT DETECTED NOT DETECTED Final   Carbapenem resist OXA 48 LIKE NOT DETECTED NOT DETECTED Final   Carbapenem resistance VIM NOT DETECTED NOT DETECTED Final    Comment: Performed at [facility], [address] [place], [place] [address]  Blood culture (routine x 2)     Status: None (Preliminary result)   Collection Time: 04/23/24  3:07 PM   Specimen: BLOOD  Result Value Ref Range Status   Specimen Description BLOOD BLOOD LEFT HAND  Final   Special Requests   Final    BOTTLES DRAWN AEROBIC AND ANAEROBIC Blood Culture adequate volume   Culture   Final    NO GROWTH 2 DAYS Performed at [facility], [address], [place], [place] [address]    Report Status PENDING  Incomplete  Urine Culture (for pregnant, neutropenic or urologic patients or patients with an indwelling urinary catheter)     Status: Abnormal (Preliminary result)   Collection Time: 04/23/24  3:19 PM   Specimen: Urine, Random  Result Value Ref Range Status   Specimen Description   Final    URINE, RANDOM Performed at [facility], [address], [place], [place] [address]    Special Requests   Final    NONE Performed at [facility], [address], [place], [place] [address]    Culture (A)  Final    >=100,000 COLONIES/mL ESCHERICHIA COLI SUSCEPTIBILITIES TO FOLLOW Performed at [facility], [address], [place], [place] [address]    Report Status PENDING  Incomplete    IMAGING: CT Angio Chest/Abd/Pel for Dissection W and/or Wo Contrast Result Date: 04/23/2024 CLINICAL DATA:  Acute aortic syndrome suspected.  Back pain. EXAM: CT ANGIOGRAPHY CHEST, ABDOMEN AND PELVIS TECHNIQUE: Non-contrast CT of the chest was initially obtained. Multidetector CT imaging through the chest, abdomen and pelvis was performed using the standard protocol during bolus  administration of intravenous contrast. Multiplanar reconstructed images and MIPs were obtained and reviewed to evaluate the vascular anatomy. RADIATION DOSE REDUCTION: This exam was performed according to the departmental dose-optimization program which includes automated exposure control, adjustment of the mA and/or kV according to patient size and/or use of iterative reconstruction technique. CONTRAST:  OMNIPAQUE  IOHEXOL  350 MG/ML SOLN COMPARISON:  02/26/2022, 03/17/2024. FINDINGS: CTA CHEST FINDINGS Cardiovascular: The heart is normal in size and there is a small pericardial effusion. Scattered coronary artery calcifications are present. There is atherosclerotic calcification of the aorta without evidence of aneurysm or dissection. The pulmonary trunk is normal in caliber. Mediastinum/Nodes: No enlarged mediastinal, hilar, or axillary lymph nodes. Thyroid gland, trachea, and esophagus demonstrate no significant findings. Lungs/Pleura: There is a small pleural effusion on the right and trace pleural effusion on the left. Centrilobular emphysematous changes are present in the lungs. Atelectasis is present bilaterally. No pneumothorax is seen. There is a stable 4 mm nodule in the anterior aspect of the right upper lobe, axial image 56. Musculoskeletal: Radiopaque densities are noted in the posterior chest wall, compatible with known shrapnel. Degenerative changes are present in the thoracic spine. No acute osseous abnormality is seen. Review of the MIP images confirms the above findings. CTA ABDOMEN AND PELVIS FINDINGS VASCULAR Aorta: Normal caliber aorta without aneurysm, dissection, vasculitis or significant stenosis. Aortic atherosclerosis. Celiac: Patent without evidence of aneurysm, dissection, vasculitis or significant stenosis. SMA: Patent without evidence of aneurysm, dissection, vasculitis or significant stenosis. Renals: Both renal arteries are patent without evidence of aneurysm, dissection,  vasculitis, fibromuscular dysplasia or significant stenosis. IMA: Patent without evidence of aneurysm, dissection, vasculitis or significant stenosis. Inflow: Patent without evidence of aneurysm, dissection, vasculitis or significant stenosis. Veins: No obvious venous abnormality within the limitations of this arterial phase study. Review of the MIP images confirms the above findings. NON-VASCULAR Hepatobiliary: No focal liver abnormality is seen. No gallstones, gallbladder wall thickening, or biliary dilatation. Pancreas: Unremarkable. No  pancreatic ductal dilatation or surrounding inflammatory changes. Spleen: Normal in size without focal abnormality. Adrenals/Urinary Tract: The adrenal glands are within normal limits. There is patchy hypoenhancement of the kidneys bilaterally, concerning for pyelonephritis. No renal calculus or hydronephrosis is seen. The bladder is within normal limits. Stomach/Bowel: Stomach is within normal limits. No bowel obstruction, free air, or pneumatosis is seen. Scattered diverticula are present along the colon without evidence of diverticulitis. Appendix appears normal. Lymphatic: No abdominal or pelvic lymphadenopathy by size criteria. Reproductive: Prostate is unremarkable. Other: No abdominopelvic ascites. Musculoskeletal: Degenerative changes are present in the lumbar spine. No acute osseous abnormality. Scattered radiopaque structures are noted in the abdomen and pelvis, compatible with known shrapnel. Review of the MIP images confirms the above findings. IMPRESSION: 1. Aortic atherosclerosis without evidence of aneurysm or dissection. 2. Marked patchy hypoenhancement of the kidneys bilaterally, compatible with pyelonephritis. No renal calculus or obstructive uropathy is seen. 3. Small pleural effusion on the right and trace pleural effusion on the left with a associated atelectasis. 4. Emphysema. 5. Coronary artery calcifications. Electronically Signed   By: Leita Birmingham M.D.    On: 04/23/2024 14:57    Assessment:   Edward Macias is a 63 y.o. male with history of multiple medical problems including hypertension asthma COPD admitted with 4 days of back pain fevers and chills.  He was found to have pyelonephritis with acute kidney injury and E. coli bacteremia and UTI.  Clinically he is improving.  Recommendations Continue ceftriaxone  pending sensitivities.  Likely can transition to orals based on sensitivities.  Today's date 3 of treatment.  People often have persistent fevers with this degree of pyelonephritis for several days. He will likely need a relatively prolonged course.  Unclear why he developed a UTI this magnitude.  His prostate looked normal per report on the CT.  He will need at some point PSA checked although I would not check it in the acute setting.   Thank you very much for allowing me to participate in the care of this patient. Please call with questions.   Alm SQUIBB. Epifanio, MD       [1]  Social History Tobacco Use   Smoking status: Every Day    Current packs/day: 1.00    Average packs/day: 1 pack/day for 39.0 years (39.0 ttl pk-yrs)    Types: Cigarettes   Smokeless tobacco: Never  Vaping Use   Vaping status: Never Used  Substance Use Topics   Alcohol use: Yes    Comment: occasional   Drug use: No  [2] No Known Allergies

## 2024-04-25 NOTE — Discharge Instructions (Signed)
 Some PCP options in [place] area- not a comprehensive list  [facility]- [phone] [facility]- [phone] [facility]- [phone] [facility]- [phone] [facility]- [phone] Nichole [phone]  or [facility] Health Physician Referral Line [phone]   [facility] Primary Care Provider List  [facility] HealthCare at [facility] [address] [place], [place] [address] [phone]  [facility] HealthCare at [facility] [address] [place], [place] [address] [phone]  [facility] Patient [facility] [address] [place], [place], [place] [address] [phone]  [facility] Primary Care at [facility] [address], [place], [place] [address] [phone]  [facility] Primary Care at [facility] [address], [place], [place] [address] [phone]  [facility] [address] [place], [place], [place] [address] [phone]  [facility] [address], [place], [place] [address] [phone]  [facility] [address], [place], [place] [address] [phone]  [facility] [address] [place], [place] [address] [phone]  [facility] [address], [place], [place] [address] [phone]  [facility] and [facility] [address] [place], [address], [place], [place] [address] [phone]  [facility] [address], [place], [place] [address] [phone]  [facility] Healthcare at [facility] [address] [place], [place], [place] [address] [phone]  [facility] HealthCare at [facility] [address], [place], [place] [address] [phone]  [facility] HealthCare at [facility] [address] [place], [place] [address] [phone]   Do you feel isolated?  [facility] offers a [employer] that anyone can call toll free at [phone]. The friendship line is available 24 hours a day  [facility] is a Program of All-inclusive Care for the Elderly (PACE). Their mission is to promote and sustain the independence of seniors wishing to remain in the community. They provide seniors with comprehensive long-term health, social, medical and dietary care. Their program is a safe alternative to nursing home care. [phone]  [facility] Physical Address [facility] [address] [place], [place] [address] Phone: [phone]. . Online zoom yoga class, connect with others without leaving your home [facility] offers [place] dance cardio sessions for individuals via Zoom. This program provides: - Dance fitness activities Please contact program for more information. Servinganyone in need adults 18+ hiv/aids individuals families Call [phone]  Email [email or web address] .com to get more info  [employer] offers an online [employer] to individuals where they can receive help to focus on their best health. Whether you're a [employer] member or not, the neighborhood center offers a... Main Serviceshealth education  exercise & fitness  community support services  recreation  virtual support Other Servicessupport groups Servinganyone in need adults young adults teens seniors individuals families [email or web address] to get more info  Schedule on their website  [facility] offers an array of activities for adults age 41 and over. This program provides:- Fitness and health programs- Tech classes- Activity books Main Serviceshealth education  community support services  exercise & fitness  recreation  more education Servingseniors  Call [phone]    For more  resources go online to [email or web address] and type in you zipcode

## 2024-04-25 NOTE — Progress Notes (Signed)
 Progress Note   Patient: Edward Macias FMW:[id number] DOB: [birth year] DOA: 04/23/2024     2 DOS: the patient was seen and examined on 04/25/2024   Brief hospital course: Edward Macias is a 63 y.o. male with medical history significant of HTN, asthma/COPD, presented with worsening of bilateral back pain and fever and chills.  Patient also had dysuria and frequent urination. He had fever up with a Tmax of 101.6, tachycardia and tachypnea.  Leukocytosis of 33.1.  Acute kidney injury of 1.97. Urine abnormal, urine culture sent out.  Blood culture grew E. coli. Patient has been giving fluids, he is also receiving Rocephin .   Principal Problem:   Pyelonephritis Active Problems:   Severe sepsis (HCC)   COPD (chronic obstructive pulmonary disease) (HCC)   Acute pyelonephritis   Hyponatremia   Hypokalemia   AKI (acute kidney injury)   E. coli septicemia (HCC)   Assessment and Plan: Severe sepsis with E. coli septicemia secondary to pyelonephritis. Bilateral pyelonephritis secondary to E. coli. Acute cystitis secondary to E. coli. Patient met severe sepsis criteria with tachycardia, high fever, severe leukocytosis and acute renal failure. This is a secondary to acute pyelonephritis, 1/2 bottles of blood culture also growing E. coli.  Urine culture still pending. CT abdomen/pelvis did not show any kidney stone or hydronephrosis.  Bladder scan did not show any urinary tension. Patient still had a fever, poor appetite.  Blood culture does not seem to have significant resistant genes.  Rocephin  appear appropriate. Will continue to treat with Rocephin , will consider perform renal ultrasound to rule out abscess if fever persists. Patient is still very sick, will follow closely    Acute kidney injury secondary to sepsis. Hyponatremia. Hypokalemia. Metabolic acidosis. Patient does not have hydronephrosis or urinary retention.  AKI appears to be secondary to sepsis.  Sodium level dropped down to  127, patient has been drinking large amount of water  due to dry mouth.  I will place on fluid restriction of 1800 mL, I will continue IV fluids, but changed to bicarb.  Potassium now has normalized.   Essential hypertension. Continue to monitor.   COPD. No exacerbation.         Subjective:  Patient is complaining of dry mouth, he is drinking large amount of water , to the point that his stomach is bloated.  He has not had a bowel movement since admission.  But no abdominal pain.  Physical Exam: Vitals:   04/24/24 1924 04/24/24 2045 04/25/24 0450 04/25/24 0819  BP: 138/81 130/71 (!) 157/91 139/86  Pulse: (!) 110 (!) 102  (!) 101  Resp:  17 18   Temp: (!) 101.7 F (38.7 C) 99.3 F (37.4 C) 99.6 F (37.6 C) 98.6 F (37 C)  TempSrc: Oral     SpO2: 100% 96% 99% 99%  Weight:      Height:       General exam: Appears calm and comfortable  Respiratory system: Clear to auscultation. Respiratory effort normal. Cardiovascular system: Regular and tachycardic. No JVD, murmurs, rubs, gallops or clicks. No pedal edema. Gastrointestinal system: Abdomen is nondistended, soft and nontender. No organomegaly or masses felt. Normal bowel sounds heard. Central nervous system: Alert and oriented. No focal neurological deficits. Extremities: Symmetric 5 x 5 power. Skin: No rashes, lesions or ulcers Psychiatry: Judgement and insight appear normal. Mood & affect appropriate.    Data Reviewed:  Lab results reviewed.  Family Communication: None  Disposition: Status is: Inpatient Remains inpatient appropriate because: Severity of disease, IV treatment.  Time spent: 55 minutes  Author: Murvin Mana, MD 04/25/2024 9:51 AM  For on call review [email or web address].

## 2024-04-26 DIAGNOSIS — N179 Acute kidney failure, unspecified: Secondary | ICD-10-CM | POA: Diagnosis not present

## 2024-04-26 DIAGNOSIS — N12 Tubulo-interstitial nephritis, not specified as acute or chronic: Secondary | ICD-10-CM | POA: Diagnosis not present

## 2024-04-26 DIAGNOSIS — B9620 Unspecified Escherichia coli [E. coli] as the cause of diseases classified elsewhere: Secondary | ICD-10-CM | POA: Diagnosis not present

## 2024-04-26 LAB — BASIC METABOLIC PANEL WITH GFR
Anion gap: 10 (ref 5–15)
BUN: 21 mg/dL (ref 8–23)
CO2: 28 mmol/L (ref 22–32)
Calcium: 8.7 mg/dL — ABNORMAL LOW (ref 8.9–10.3)
Chloride: 95 mmol/L — ABNORMAL LOW (ref 98–111)
Creatinine, Ser: 1.64 mg/dL — ABNORMAL HIGH (ref 0.61–1.24)
GFR, Estimated: 46 mL/min — ABNORMAL LOW (ref >60)
Glucose, Bld: 115 mg/dL — ABNORMAL HIGH (ref 70–99)
Potassium: 4.2 mmol/L (ref 3.5–5.1)
Sodium: 133 mmol/L — ABNORMAL LOW (ref 135–145)

## 2024-04-26 LAB — CBC
HCT: 29.1 % — ABNORMAL LOW (ref 39.0–52.0)
Hemoglobin: 10.5 g/dL — ABNORMAL LOW (ref 13.0–17.0)
MCH: 31.9 pg (ref 26.0–34.0)
MCHC: 36.1 g/dL — ABNORMAL HIGH (ref 30.0–36.0)
MCV: 88.4 fL (ref 80.0–100.0)
Platelets: 356 K/uL (ref 150–400)
RBC: 3.29 MIL/uL — ABNORMAL LOW (ref 4.22–5.81)
RDW: 15.6 % — ABNORMAL HIGH (ref 11.5–15.5)
WBC: 24.8 K/uL — ABNORMAL HIGH (ref 4.0–10.5)
nRBC: 0 % (ref 0.0–0.2)

## 2024-04-26 LAB — CULTURE, BLOOD (ROUTINE X 2): Special Requests: ADEQUATE

## 2024-04-26 LAB — MAGNESIUM: Magnesium: 1.6 mg/dL — ABNORMAL LOW (ref 1.7–2.4)

## 2024-04-26 LAB — URINE CULTURE: Culture: >=100000 — AB

## 2024-04-26 LAB — PHOSPHORUS: Phosphorus: 2.9 mg/dL (ref 2.5–4.6)

## 2024-04-26 MED ORDER — ENSURE PLUS HIGH PROTEIN PO LIQD
237.0000 mL | Freq: Two times a day (BID) | ORAL | Status: DC
  Administered 2024-04-26 – 2024-04-27 (×3): 237 mL via ORAL

## 2024-04-26 MED ORDER — SENNOSIDES-DOCUSATE SODIUM 8.6-50 MG PO TABS
2.0000 | ORAL_TABLET | Freq: Two times a day (BID) | ORAL | Status: DC
  Administered 2024-04-26 – 2024-04-28 (×4): 2 via ORAL
  Filled 2024-04-26 (×4): qty 2

## 2024-04-26 MED ORDER — FLUTICASONE FUROATE-VILANTEROL 100-25 MCG/ACT IN AEPB
1.0000 | INHALATION_SPRAY | Freq: Every day | RESPIRATORY_TRACT | Status: DC
  Administered 2024-04-26 – 2024-04-28 (×3): 1 via RESPIRATORY_TRACT
  Filled 2024-04-26: qty 28

## 2024-04-26 MED ORDER — MAGNESIUM SULFATE 2 GM/50ML IV SOLN
2.0000 g | Freq: Once | INTRAVENOUS | Status: AC
  Administered 2024-04-26: 10:00:00 2 g via INTRAVENOUS
  Filled 2024-04-26: qty 50

## 2024-04-26 MED ORDER — POLYETHYLENE GLYCOL 3350 17 G PO PACK
17.0000 g | PACK | Freq: Every day | ORAL | Status: DC
  Administered 2024-04-26 – 2024-04-28 (×3): 17 g via ORAL
  Filled 2024-04-26 (×3): qty 1

## 2024-04-26 MED ORDER — IPRATROPIUM-ALBUTEROL 0.5-2.5 (3) MG/3ML IN SOLN
3.0000 mL | Freq: Three times a day (TID) | RESPIRATORY_TRACT | Status: DC
  Administered 2024-04-26 – 2024-04-28 (×5): 3 mL via RESPIRATORY_TRACT
  Filled 2024-04-26 (×7): qty 3

## 2024-04-26 MED ORDER — SODIUM CHLORIDE 0.9 % IV SOLN: INTRAVENOUS | Status: AC

## 2024-04-26 NOTE — Plan of Care (Signed)
  Problem: Coping: Goal: Level of anxiety will decrease Outcome: Progressing   Problem: Elimination: Goal: Will not experience complications related to bowel motility Outcome: Progressing   Problem: Pain Managment: Goal: General experience of comfort will improve and/or be controlled Outcome: Progressing   Problem: Safety: Goal: Ability to remain free from injury will improve Outcome: Progressing   Problem: Skin Integrity: Goal: Risk for impaired skin integrity will decrease Outcome: Progressing

## 2024-04-26 NOTE — Progress Notes (Signed)
 INFECTIOUS DISEASE PROGRESS NOTE Date of Admission:  04/23/2024     ID: Edward Macias is a 63 y.o. male with urosepsis Principal Problem:   Pyelonephritis Active Problems:   Severe sepsis (HCC)   COPD (chronic obstructive pulmonary disease) (HCC)   COPD with acute exacerbation (HCC)   Acute pyelonephritis   Hyponatremia   Hypokalemia   AKI (acute kidney injury)   E. coli septicemia (HCC)   Hypomagnesemia   Subjective: No fever but white count remains elevated. Feels weak, some abd pain. Some constipation   ROS  Eleven systems are reviewed and negative except per hpi  Medications:  Antibiotics Given (last 72 hours)     Date/Time Action Medication Dose Rate   04/23/24 1528 New Bag/Given   cefTRIAXone  (ROCEPHIN ) 2 g in sodium chloride  0.9 % 100 mL IVPB 2 g 200 mL/hr   04/24/24 0845 New Bag/Given   cefTRIAXone  (ROCEPHIN ) 2 g in sodium chloride  0.9 % 100 mL IVPB 2 g 200 mL/hr   04/25/24 1100 New Bag/Given   cefTRIAXone  (ROCEPHIN ) 2 g in sodium chloride  0.9 % 100 mL IVPB 2 g 200 mL/hr   04/26/24 1221 New Bag/Given   cefTRIAXone  (ROCEPHIN ) 2 g in sodium chloride  0.9 % 100 mL IVPB 2 g 200 mL/hr       feeding supplement  237 mL Oral BID BM   fluticasone  furoate-vilanterol  1 puff Inhalation Daily   ipratropium-albuterol   3 mL Nebulization Q6H   polyethylene glycol  17 g Oral Daily   senna-docusate  2 tablet Oral BID   tamsulosin   0.4 mg Oral QPC supper    Objective: Vital signs in last 24 hours: Temp:  [98 F (36.7 C)-99.6 F (37.6 C)] 99.6 F (37.6 C) (12/16 1201) Pulse Rate:  [96-105] 96 (12/16 1201) Resp:  [15-20] 20 (12/16 1201) BP: (128-162)/(76-92) 128/76 (12/16 1201) SpO2:  [94 %-97 %] 95 % (12/16 1201) Constitutional: He is oriented to person, place, and time. He appears well-developed and well-nourished. No distress.  HENT:  Mouth/Throat: Oropharynx is clear and moist. No oropharyngeal exudate.  Cardiovascular: Normal rate, regular rhythm and normal heart  sounds. Exam reveals no gallop and no friction rub.  No murmur heard.  Pulmonary/Chest: Effort normal and breath sounds normal. No respiratory distress. He has no wheezes.  Abdominal: Soft. Bowel sounds are normal. He exhibits no distension. There is no tenderness.  Mild CVA tenderness Lymphadenopathy:  He has no cervical adenopathy.  Neurological: He is alert and oriented to person, place, and time.  Skin: Skin is warm and dry. No rash noted. No erythema.  Psychiatric: He has a normal mood and affect. His behavior is normal.   Lab Results Recent Labs    04/25/24 0606 04/26/24 0530  WBC 24.5* 24.8*  HGB 11.4* 10.5*  HCT 32.1* 29.1*  NA 127* 133*  K 4.6 4.2  CL 95* 95*  CO2 18* 28  BUN 25* 21  CREATININE 1.71* 1.64*    Microbiology: Results for orders placed or performed during the hospital encounter of 04/23/24  Blood culture (routine x 2)     Status: Abnormal   Collection Time: 04/23/24  3:02 PM   Specimen: BLOOD  Result Value Ref Range Status   Specimen Description   Final    BLOOD RIGHT ANTECUBITAL Performed at [facility], [address] [place] [address], [place], [place] [address]    Special Requests   Final    BOTTLES DRAWN AEROBIC AND ANAEROBIC Blood Culture adequate volume Performed at [facility], [address] [place], [place] [address]    Culture  Setup Time   Final    GRAM NEGATIVE RODS AEROBIC BOTTLE ONLY CRITICAL RESULT CALLED TO, READ BACK BY AND VERIFIED WITH: NATHAN B., PHARM D AT 9371 04/24/24 RAM GRAM STAIN REVIEWED-AGREE WITH RESULT Performed at [facility], [address] [place], [place] [address]    Culture ESCHERICHIA COLI (A)  Final   Report Status 04/26/2024 FINAL  Final   Organism ID, Bacteria ESCHERICHIA COLI  Final      Susceptibility   Escherichia coli - MIC*    AMPICILLIN 8 SENSITIVE Sensitive     CEFAZOLIN  (NON-URINE) 2 SENSITIVE Sensitive     CEFEPIME  <=0.12 SENSITIVE Sensitive     ERTAPENEM <=0.12 SENSITIVE  Sensitive     CEFTRIAXONE  <=0.25 SENSITIVE Sensitive     CIPROFLOXACIN <=0.06 SENSITIVE Sensitive     GENTAMICIN <=1 SENSITIVE Sensitive     MEROPENEM <=0.25 SENSITIVE Sensitive     TRIMETH /SULFA  <=20 SENSITIVE Sensitive     AMPICILLIN/SULBACTAM <=2 SENSITIVE Sensitive     PIP/TAZO Value in next row Sensitive      <=4 SENSITIVEThis is a modified FDA-approved test that has been validated and its performance characteristics determined by the reporting laboratory.  This laboratory is certified under the Clinical Laboratory Improvement Amendments CLIA as qualified to perform high complexity clinical laboratory testing.    * ESCHERICHIA COLI  Blood Culture ID Panel (Reflexed)     Status: Abnormal   Collection Time: 04/23/24  3:02 PM  Result Value Ref Range Status   Enterococcus faecalis NOT DETECTED NOT DETECTED Final   Enterococcus Faecium NOT DETECTED NOT DETECTED Final   Listeria monocytogenes NOT DETECTED NOT DETECTED Final   Staphylococcus species NOT DETECTED NOT DETECTED Final   Staphylococcus aureus (BCID) NOT DETECTED NOT DETECTED Final   Staphylococcus epidermidis NOT DETECTED NOT DETECTED Final   Staphylococcus lugdunensis NOT DETECTED NOT DETECTED Final   Streptococcus species NOT DETECTED NOT DETECTED Final   Streptococcus agalactiae NOT DETECTED NOT DETECTED Final   Streptococcus pneumoniae NOT DETECTED NOT DETECTED Final   Streptococcus pyogenes NOT DETECTED NOT DETECTED Final   A.calcoaceticus-baumannii NOT DETECTED NOT DETECTED Final   Bacteroides fragilis NOT DETECTED NOT DETECTED Final   Enterobacterales DETECTED (A) NOT DETECTED Final    Comment: Enterobacterales represent a large order of gram negative bacteria, not a single organism. CRITICAL RESULT CALLED TO, READ BACK BY AND VERIFIED WITH: RANKIN WENDI GOWER D AT 9371 04/24/24 RAM    Enterobacter cloacae complex NOT DETECTED NOT DETECTED Final   Escherichia coli DETECTED (A) NOT DETECTED Final    Comment: CRITICAL  RESULT CALLED TO, READ BACK BY AND VERIFIED WITH: RANKIN WENDI GOWER D AT 0628 04/24/24 RAM    Klebsiella aerogenes NOT DETECTED NOT DETECTED Final   Klebsiella oxytoca NOT DETECTED NOT DETECTED Final   Klebsiella pneumoniae NOT DETECTED NOT DETECTED Final   Proteus species NOT DETECTED NOT DETECTED Final   Salmonella species NOT DETECTED NOT DETECTED Final   Serratia marcescens NOT DETECTED NOT DETECTED Final   Haemophilus influenzae NOT DETECTED NOT DETECTED Final   Neisseria meningitidis NOT DETECTED NOT DETECTED Final   Pseudomonas aeruginosa NOT DETECTED NOT DETECTED Final   Stenotrophomonas maltophilia NOT DETECTED NOT DETECTED Final   Candida albicans NOT DETECTED NOT DETECTED Final   Candida auris NOT DETECTED NOT DETECTED Final   Candida glabrata NOT DETECTED NOT DETECTED Final   Candida krusei NOT DETECTED NOT DETECTED Final  Candida parapsilosis NOT DETECTED NOT DETECTED Final   Candida tropicalis NOT DETECTED NOT DETECTED Final   Cryptococcus neoformans/gattii NOT DETECTED NOT DETECTED Final   CTX-M ESBL NOT DETECTED NOT DETECTED Final   Carbapenem resistance IMP NOT DETECTED NOT DETECTED Final   Carbapenem resistance KPC NOT DETECTED NOT DETECTED Final   Carbapenem resistance NDM NOT DETECTED NOT DETECTED Final   Carbapenem resist OXA 48 LIKE NOT DETECTED NOT DETECTED Final   Carbapenem resistance VIM NOT DETECTED NOT DETECTED Final    Comment: Performed at [facility], [address] [place], [place] [address]  Blood culture (routine x 2)     Status: None (Preliminary result)   Collection Time: 04/23/24  3:07 PM   Specimen: BLOOD  Result Value Ref Range Status   Specimen Description BLOOD BLOOD LEFT HAND  Final   Special Requests   Final    BOTTLES DRAWN AEROBIC AND ANAEROBIC Blood Culture adequate volume   Culture   Final    NO GROWTH 3 DAYS Performed at [facility], [address], [place], [place] [address]    Report Status PENDING   Incomplete  Urine Culture (for pregnant, neutropenic or urologic patients or patients with an indwelling urinary catheter)     Status: Abnormal   Collection Time: 04/23/24  3:19 PM   Specimen: Urine, Random  Result Value Ref Range Status   Specimen Description   Final    URINE, RANDOM Performed at [facility], [address] [place], [place] [address]    Special Requests   Final    NONE Performed at [facility], [address] [place], [place] [address]    Culture >=100,000 COLONIES/mL ESCHERICHIA COLI (A)  Final   Report Status 04/26/2024 FINAL  Final   Organism ID, Bacteria ESCHERICHIA COLI (A)  Final      Susceptibility   Escherichia coli - MIC*    AMPICILLIN 8 SENSITIVE Sensitive     CEFAZOLIN  (URINE) Value in next row Sensitive      2 SENSITIVEThis is a modified FDA-approved test that has been validated and its performance characteristics determined by the reporting laboratory.  This laboratory is certified under the Clinical Laboratory Improvement Amendments CLIA as qualified to perform high complexity clinical laboratory testing.    CEFEPIME  Value in next row Sensitive      2 SENSITIVEThis is a modified FDA-approved test that has been validated and its performance characteristics determined by the reporting laboratory.  This laboratory is certified under the Clinical Laboratory Improvement Amendments CLIA as qualified to perform high complexity clinical laboratory testing.    ERTAPENEM Value in next row Sensitive      2 SENSITIVEThis is a modified FDA-approved test that has been validated and its performance characteristics determined by the reporting laboratory.  This laboratory is certified under the Clinical Laboratory Improvement Amendments CLIA as qualified to perform high complexity clinical laboratory testing.    CEFTRIAXONE  Value in next row Sensitive      2 SENSITIVEThis is a modified FDA-approved test that has been validated and its performance  characteristics determined by the reporting laboratory.  This laboratory is certified under the Clinical Laboratory Improvement Amendments CLIA as qualified to perform high complexity clinical laboratory testing.    CIPROFLOXACIN Value in next row Sensitive      2 SENSITIVEThis is a modified FDA-approved test that has been validated and its performance characteristics determined by the reporting laboratory.  This laboratory is certified under the Clinical Laboratory  Improvement Amendments CLIA as qualified to perform high complexity clinical laboratory testing.    GENTAMICIN Value in next row Sensitive      2 SENSITIVEThis is a modified FDA-approved test that has been validated and its performance characteristics determined by the reporting laboratory.  This laboratory is certified under the Clinical Laboratory Improvement Amendments CLIA as qualified to perform high complexity clinical laboratory testing.    NITROFURANTOIN Value in next row Sensitive      2 SENSITIVEThis is a modified FDA-approved test that has been validated and its performance characteristics determined by the reporting laboratory.  This laboratory is certified under the Clinical Laboratory Improvement Amendments CLIA as qualified to perform high complexity clinical laboratory testing.    TRIMETH /SULFA  Value in next row Sensitive      2 SENSITIVEThis is a modified FDA-approved test that has been validated and its performance characteristics determined by the reporting laboratory.  This laboratory is certified under the Clinical Laboratory Improvement Amendments CLIA as qualified to perform high complexity clinical laboratory testing.    AMPICILLIN/SULBACTAM Value in next row Sensitive      2 SENSITIVEThis is a modified FDA-approved test that has been validated and its performance characteristics determined by the reporting laboratory.  This laboratory is certified under the Clinical Laboratory Improvement Amendments CLIA as qualified to  perform high complexity clinical laboratory testing.    PIP/TAZO Value in next row Sensitive      <=4 SENSITIVEThis is a modified FDA-approved test that has been validated and its performance characteristics determined by the reporting laboratory.  This laboratory is certified under the Clinical Laboratory Improvement Amendments CLIA as qualified to perform high complexity clinical laboratory testing.    MEROPENEM Value in next row Sensitive      <=4 SENSITIVEThis is a modified FDA-approved test that has been validated and its performance characteristics determined by the reporting laboratory.  This laboratory is certified under the Clinical Laboratory Improvement Amendments CLIA as qualified to perform high complexity clinical laboratory testing.    * >=100,000 COLONIES/mL ESCHERICHIA COLI    Studies/Results: DG Chest 2 View Result Date: 04/25/2024 EXAM: 2 VIEW(S) XRAY OF THE CHEST 04/25/2024 03:12:34 PM COMPARISON: 03/17/2024 CLINICAL HISTORY: Shortness of breath FINDINGS: LUNGS AND PLEURA: New platelike atelectasis in the right lung base and left mid lung. Small effusions are noted bilaterally. No pneumothorax. HEART AND MEDIASTINUM: Aortic atherosclerosis. No acute abnormality of the cardiac and mediastinal silhouettes. BONES AND SOFT TISSUES: Scattered retained metallic BBs. No acute osseous abnormality. IMPRESSION: 1. New platelike atelectasis in the right lung base and left mid lung. 2. Small bilateral pleural effusions. Electronically signed by: Oneil Devonshire MD 04/25/2024 07:35 PM EST RP Workstation: [id number]   DG Abd 1 View Result Date: 04/25/2024 EXAM: 1 VIEW XRAY OF THE ABDOMEN 04/25/2024 01:00:00 PM COMPARISON: None available. CLINICAL HISTORY: Abdominal distension FINDINGS: BOWEL: Nonobstructive bowel gas pattern. SOFT TISSUES: Scattered metallic foreign bodies overlying the abdomen. Vascular calcifications. BONES: No acute fracture. IMPRESSION: 1. No acute findings. Electronically  signed by: Oneil Devonshire MD 04/25/2024 07:34 PM EST RP Workstation: [id number]    Assessment/Plan: Thiago Ragsdale is a 63 y.o. male with history of multiple medical problems including hypertension asthma COPD admitted with 4 days of back pain fevers and chills.  He was found to have pyelonephritis with acute kidney injury and E. coli bacteremia and UTI.  Clinically he is improving.   Recommendations Continue ceftriaxone  while inpt   Likely can transition to orals based on sensitivities when stable and wbc  decreased.  T Patients  often have persistent fevers with this degree of pyelonephritis for several days. He will likely need a relatively prolonged course.   Unclear why he developed a UTI this magnitude.  His prostate looked normal per report on the CT.  He will need at some point PSA checked although I would not check it in the acute setting.   Thank you very much for the consult. Will follow with you.  Alm SHAUNNA Needle   04/26/2024, 3:13 PM

## 2024-04-26 NOTE — Progress Notes (Signed)
 Progress Note   Patient: Edward Macias FMW:[id number] DOB: 03-Sep-1960 DOA: 04/23/2024     3 DOS: the patient was seen and examined on 04/26/2024   Brief hospital course: Jawaan Adachi is a 63 y.o. male with medical history significant of HTN, asthma/COPD, presented with worsening of bilateral back pain and fever and chills.  Patient also had dysuria and frequent urination. He had fever up with a Tmax of 101.6, tachycardia and tachypnea.  Leukocytosis of 33.1.  Acute kidney injury of 1.97.  Blood culture grew E. coli.  Urine culture also growing E. coli pansensitive. Patient has been giving fluids, he is also receiving Rocephin . Patient is treated with Rocephin , also receiving fluids for AKI.  Bladder scan no urinary retention.  CT scan did not show any hydronephrosis.   Principal Problem:   Pyelonephritis Active Problems:   Severe sepsis (HCC)   COPD (chronic obstructive pulmonary disease) (HCC)   Acute pyelonephritis   Hyponatremia   Hypokalemia   AKI (acute kidney injury)   E. coli septicemia (HCC)   Hypomagnesemia   Assessment and Plan: Severe sepsis with E. coli septicemia secondary to pyelonephritis. Bilateral pyelonephritis secondary to E. coli. Acute cystitis with hematuria secondary to E. coli. Patient met severe sepsis criteria with tachycardia, high fever, severe leukocytosis and acute renal failure. This is secondary to acute pyelonephritis, 1/2 bottles of blood culture also growing E. coli.  Urine culture still pending. CT abdomen/pelvis did not show any kidney stone or hydronephrosis.  Bladder scan did not show any urinary tension. Patient still had a fever, poor appetite.  Blood culture does not seem to have significant resistant genes, final culture still pending.  Urine culture came back with E. coli pansensitive. Patient finally is improving, no fever for last 24 hours.  Seen by ID, Rocephin  appears to be appropriate.     Acute kidney injury secondary to  sepsis. Hyponatremia. Hypokalemia. Metabolic acidosis. Hypomagnesemia Patient does not have hydronephrosis or urinary retention.  AKI appears to be secondary to sepsis.  Sodium level dropped down to 127, patient has been drinking large amount of water  due to dry mouth.  I have placed on fluid restriction of 1800 mL, and continued IV fluids.  Potassium now has normalized. Metabolic acidosis has improved after giving bicarb drip.  Sodium level also gradually improving, will need to replete potassium.  COPD exacerbation. Patient had a significant short of breath and tachypnea, was placed on 2 L oxygen without documented hypoxia.  I have scheduled a DuoNeb every 6 hours, continued albuterol  every 2 hours as needed.  Also scheduled Breo.  Essential hypertension. Continue to monitor.        Subjective:  Patient had a constipation, had a Fleet enema, had a bowel movement afterwards.  He has some nausea, poor appetite. He still has some short of breath, tachypnea.  He has a cough, largely nonproductive.  Physical Exam: Vitals:   04/25/24 1944 04/26/24 0218 04/26/24 0442 04/26/24 0758  BP: 134/78  (!) 140/80 136/80  Pulse: 100  100 99  Resp: 15  16 20   Temp: 99.2 F (37.3 C)  98.1 F (36.7 C) 98 F (36.7 C)  TempSrc: Oral  Oral Oral  SpO2: 96% 96% 97% 96%  Weight:      Height:       General exam: Appears calm and comfortable  Respiratory system: Decreased breath sounds with wheezes. Respiratory effort normal. Cardiovascular system: S1 & S2 heard, RRR. No JVD, murmurs, rubs, gallops or clicks. No pedal  edema. Gastrointestinal system: Abdomen is nondistended, soft and nontender. No organomegaly or masses felt. Normal bowel sounds heard. Central nervous system: Alert and oriented. No focal neurological deficits. Extremities: Symmetric 5 x 5 power. Skin: No rashes, lesions or ulcers Psychiatry: Judgement and insight appear normal. Mood & affect appropriate.    Data  Reviewed:  X-ray reviewed, lab results reviewed.  Family Communication: None  Disposition: Status is: Inpatient Remains inpatient appropriate because: Severity of disease, IV treatment     Time spent: 50 minutes  Author: Murvin Mana, MD 04/26/2024 11:07 AM  For on call review [email or web address].

## 2024-04-27 ENCOUNTER — Inpatient Hospital Stay

## 2024-04-27 DIAGNOSIS — N12 Tubulo-interstitial nephritis, not specified as acute or chronic: Secondary | ICD-10-CM | POA: Diagnosis not present

## 2024-04-27 DIAGNOSIS — B9620 Unspecified Escherichia coli [E. coli] as the cause of diseases classified elsewhere: Secondary | ICD-10-CM | POA: Diagnosis not present

## 2024-04-27 DIAGNOSIS — N179 Acute kidney failure, unspecified: Secondary | ICD-10-CM | POA: Diagnosis not present

## 2024-04-27 LAB — BASIC METABOLIC PANEL WITH GFR
Anion gap: 8 (ref 5–15)
BUN: 16 mg/dL (ref 8–23)
CO2: 28 mmol/L (ref 22–32)
Calcium: 8.4 mg/dL — ABNORMAL LOW (ref 8.9–10.3)
Chloride: 95 mmol/L — ABNORMAL LOW (ref 98–111)
Creatinine, Ser: 1.36 mg/dL — ABNORMAL HIGH (ref 0.61–1.24)
GFR, Estimated: 58 mL/min — ABNORMAL LOW (ref >60)
Glucose, Bld: 123 mg/dL — ABNORMAL HIGH (ref 70–99)
Potassium: 3.9 mmol/L (ref 3.5–5.1)
Sodium: 131 mmol/L — ABNORMAL LOW (ref 135–145)

## 2024-04-27 LAB — CBC
HCT: 26.5 % — ABNORMAL LOW (ref 39.0–52.0)
Hemoglobin: 9.7 g/dL — ABNORMAL LOW (ref 13.0–17.0)
MCH: 31.6 pg (ref 26.0–34.0)
MCHC: 36.6 g/dL — ABNORMAL HIGH (ref 30.0–36.0)
MCV: 86.3 fL (ref 80.0–100.0)
Platelets: 344 K/uL (ref 150–400)
RBC: 3.07 MIL/uL — ABNORMAL LOW (ref 4.22–5.81)
RDW: 15.5 % (ref 11.5–15.5)
WBC: 26 K/uL — ABNORMAL HIGH (ref 4.0–10.5)
nRBC: 0 % (ref 0.0–0.2)

## 2024-04-27 LAB — MAGNESIUM: Magnesium: 1.7 mg/dL (ref 1.7–2.4)

## 2024-04-27 MED ORDER — SODIUM CHLORIDE 0.9 % IV SOLN: INTRAVENOUS | Status: AC

## 2024-04-27 MED ORDER — NICOTINE 21 MG/24HR TD PT24
21.0000 mg | MEDICATED_PATCH | Freq: Every day | TRANSDERMAL | Status: DC
  Administered 2024-04-27 – 2024-04-28 (×2): 21 mg via TRANSDERMAL
  Filled 2024-04-27 (×2): qty 1

## 2024-04-27 MED ORDER — AMLODIPINE BESYLATE 10 MG PO TABS
10.0000 mg | ORAL_TABLET | Freq: Every day | ORAL | Status: DC
  Administered 2024-04-27 – 2024-04-28 (×2): 10 mg via ORAL
  Filled 2024-04-27 (×2): qty 1

## 2024-04-27 MED ORDER — ORAL CARE MOUTH RINSE: 15.0000 mL | OROMUCOSAL | Status: DC | PRN

## 2024-04-27 MED ORDER — LEVOFLOXACIN IN D5W 750 MG/150ML IV SOLN
750.0000 mg | Freq: Every day | INTRAVENOUS | Status: DC
  Administered 2024-04-27 – 2024-04-28 (×2): 750 mg via INTRAVENOUS
  Filled 2024-04-27 (×2): qty 150

## 2024-04-27 NOTE — Plan of Care (Signed)

## 2024-04-27 NOTE — Progress Notes (Signed)
 Progress Note   Patient: Edward Macias FMW:[id number] DOB: 11/05/1960 DOA: 04/23/2024     4 DOS: the patient was seen and examined on 04/27/2024   Brief hospital course: Edward Macias is a 63 y.o. male with medical history significant of HTN, asthma/COPD, presented with worsening of bilateral back pain and fever and chills.  Patient also had dysuria and frequent urination. He had fever up with a Tmax of 101.6, tachycardia and tachypnea.  Leukocytosis of 33.1.  Acute kidney injury of 1.97.  Blood culture grew E. coli.  Urine culture also growing E. coli pansensitive. Patient has been giving fluids, he is also receiving Rocephin . Patient is treated with Rocephin , also receiving fluids for AKI.  Bladder scan no urinary retention.  CT scan did not show any hydronephrosis.   Assessment and Plan:  Severe sepsis with E. coli septicemia secondary to pyelonephritis. Bilateral pyelonephritis secondary to E. coli. Acute cystitis with hematuria secondary to E. coli. Patient met severe sepsis criteria with tachycardia, high fever, severe leukocytosis and acute renal failure. Serum creatinine on admission was 1.97 compared to baseline of 0.5. CT abdomen/pelvis did not show any kidney stone or hydronephrosis.  Bladder scan did not show any urinary tension. Blood and urine culture yielded pansensitive E. coli Patient has been on IV Rocephin  with persistent leukocytosis Appreciate ID input, recommends to switch patient to Levaquin  750 mg daily Patient will need a 2-week course of therapy     Acute kidney injury secondary to sepsis. Hyponatremia. Hypokalemia. Metabolic acidosis. Hypomagnesemia Patient does not have hydronephrosis or urinary retention.   AKI appears to be secondary to ATN from sepsis.   Renal function has improved but not back to baseline yet Metabolic acidosis has resolved following bicarbonate infusion Sodium levels still low but improving     COPD exacerbation. Nicotine   dependence Continue scheduled and as needed bronchodilator therapy Continue inhaled steroids Continue oxygen supplementation at 2 L Attempts were made to wean patient off oxygen and at rest on room air pulse oximetry was 85% Continue oxygen supplementation at 2 L to maintain pulse oximetry greater than 92% Patient will likely need home oxygen on discharge Continue nicotine  transdermal patch   BPH Continue Flomax     Hypertension Continue amlodipine  10 mg daily      Subjective: Requesting to be discharged home.  No new complaints  Physical Exam: Vitals:   04/27/24 0408 04/27/24 0718 04/27/24 1033 04/27/24 1139  BP: (!) 144/83 130/78  (!) 148/78  Pulse: 100 94  87  Resp:  17  18  Temp: 98.1 F (36.7 C) 99.8 F (37.7 C)  98.9 F (37.2 C)  TempSrc: Oral     SpO2: 97% 98% (!) 86% 99%  Weight:      Height:       General exam: Appears calm and comfortable  Respiratory system: Scattered wheezes. Respiratory effort normal. Cardiovascular system: S1 & S2 heard, RRR. No JVD, murmurs, rubs, gallops or clicks. No pedal edema. Gastrointestinal system: Abdomen is nondistended, soft and nontender. No organomegaly or masses felt. Normal bowel sounds heard. Central nervous system: Alert and oriented. No focal neurological deficits. Extremities: Symmetric 5 x 5 power. Skin: No rashes, lesions or ulcers Psychiatry: Judgement and insight appear normal. Mood & affect appropriate.      Data Reviewed: Labs reviewed.  Magnesium  1.7, white count 26.0, sodium 131, creatinine 1.36 Labs reviewed  Family Communication: Plan of care was discussed with patient in detail. He verbalizes understanding and agrees with the plan  Disposition:  Status is: Inpatient Remains inpatient appropriate because: On IV Abx  Planned Discharge Destination: Home    Time spent: 55 minutes  Author: Aimee Somerset, MD 04/27/2024 3:42 PM  For on call review [email or web address].

## 2024-04-27 NOTE — Plan of Care (Signed)

## 2024-04-27 NOTE — Progress Notes (Signed)
 INFECTIOUS DISEASE PROGRESS NOTE Date of Admission:  04/23/2024     ID: Edward Macias is a 63 y.o. male with urosepsis Principal Problem:   Pyelonephritis Active Problems:   Severe sepsis (HCC)   COPD (chronic obstructive pulmonary disease) (HCC)   COPD with acute exacerbation (HCC)   Acute pyelonephritis   Hyponatremia   Hypokalemia   AKI (acute kidney injury)   E. coli septicemia (HCC)   Hypomagnesemia   Subjective: No fever but white count remains elevated at 26. Getting CT chest. Reports feeling better. No fevers, chills, cough, flank pain. Still urinary urgency  ROS  Eleven systems are reviewed and negative except per hpi  Medications:  Antibiotics Given (last 72 hours)     Date/Time Action Medication Dose Rate   04/25/24 1100 New Bag/Given   cefTRIAXone  (ROCEPHIN ) 2 g in sodium chloride  0.9 % 100 mL IVPB 2 g 200 mL/hr   04/26/24 1221 New Bag/Given   cefTRIAXone  (ROCEPHIN ) 2 g in sodium chloride  0.9 % 100 mL IVPB 2 g 200 mL/hr   04/27/24 0912 New Bag/Given   cefTRIAXone  (ROCEPHIN ) 2 g in sodium chloride  0.9 % 100 mL IVPB 2 g 200 mL/hr       feeding supplement  237 mL Oral BID BM   fluticasone  furoate-vilanterol  1 puff Inhalation Daily   ipratropium-albuterol   3 mL Nebulization TID   nicotine   21 mg Transdermal Daily   polyethylene glycol  17 g Oral Daily   senna-docusate  2 tablet Oral BID   tamsulosin   0.4 mg Oral QPC supper    Objective: Vital signs in last 24 hours: Temp:  [98.1 F (36.7 C)-99.8 F (37.7 C)] 98.9 F (37.2 C) (12/17 1139) Pulse Rate:  [87-110] 87 (12/17 1139) Resp:  [17-20] 18 (12/17 1139) BP: (130-148)/(76-92) 148/78 (12/17 1139) SpO2:  [86 %-100 %] 99 % (12/17 1139) Constitutional: He is oriented to person, place, and time. He appears well-developed and well-nourished. No distress.  HENT:  Mouth/Throat: Oropharynx is clear and moist. No oropharyngeal exudate.  Cardiovascular: Normal rate, regular rhythm and normal heart sounds. Exam  reveals no gallop and no friction rub.  No murmur heard.  Pulmonary/Chest: Effort normal and breath sounds normal. No respiratory distress. He has no wheezes.  Abdominal: Soft. Bowel sounds are normal. He exhibits no distension. There is no tenderness.  Mild CVA tenderness Lymphadenopathy:  He has no cervical adenopathy.  Neurological: He is alert and oriented to person, place, and time.  Skin: Skin is warm and dry. No rash noted. No erythema.  Psychiatric: He has a normal mood and affect. His behavior is normal.   Lab Results Recent Labs    04/26/24 0530 04/27/24 0555  WBC 24.8* 26.0*  HGB 10.5* 9.7*  HCT 29.1* 26.5*  NA 133* 131*  K 4.2 3.9  CL 95* 95*  CO2 28 28  BUN 21 16  CREATININE 1.64* 1.36*    Microbiology: Results for orders placed or performed during the hospital encounter of 04/23/24  Blood culture (routine x 2)     Status: Abnormal   Collection Time: 04/23/24  3:02 PM   Specimen: BLOOD  Result Value Ref Range Status   Specimen Description   Final    BLOOD RIGHT ANTECUBITAL Performed at [facility], [address], [place], [place] [address]    Special Requests   Final    BOTTLES DRAWN AEROBIC AND ANAEROBIC Blood Culture adequate volume Performed at [facility], [address] [place], [place]  [address]    Culture  Setup Time   Final    GRAM NEGATIVE RODS AEROBIC BOTTLE ONLY CRITICAL RESULT CALLED TO, READ BACK BY AND VERIFIED WITH: NATHAN B., PHARM D AT 9371 04/24/24 RAM GRAM STAIN REVIEWED-AGREE WITH RESULT Performed at [facility], [address], [place], [place] [address]    Culture ESCHERICHIA COLI (A)  Final   Report Status 04/26/2024 FINAL  Final   Organism ID, Bacteria ESCHERICHIA COLI  Final      Susceptibility   Escherichia coli - MIC*    AMPICILLIN 8 SENSITIVE Sensitive     CEFAZOLIN  (NON-URINE) 2 SENSITIVE Sensitive     CEFEPIME  <=0.12 SENSITIVE Sensitive     ERTAPENEM <=0.12 SENSITIVE Sensitive      CEFTRIAXONE  <=0.25 SENSITIVE Sensitive     CIPROFLOXACIN <=0.06 SENSITIVE Sensitive     GENTAMICIN <=1 SENSITIVE Sensitive     MEROPENEM <=0.25 SENSITIVE Sensitive     TRIMETH /SULFA  <=20 SENSITIVE Sensitive     AMPICILLIN/SULBACTAM <=2 SENSITIVE Sensitive     PIP/TAZO Value in next row Sensitive      <=4 SENSITIVEThis is a modified FDA-approved test that has been validated and its performance characteristics determined by the reporting laboratory.  This laboratory is certified under the Clinical Laboratory Improvement Amendments CLIA as qualified to perform high complexity clinical laboratory testing.    * ESCHERICHIA COLI  Blood Culture ID Panel (Reflexed)     Status: Abnormal   Collection Time: 04/23/24  3:02 PM  Result Value Ref Range Status   Enterococcus faecalis NOT DETECTED NOT DETECTED Final   Enterococcus Faecium NOT DETECTED NOT DETECTED Final   Listeria monocytogenes NOT DETECTED NOT DETECTED Final   Staphylococcus species NOT DETECTED NOT DETECTED Final   Staphylococcus aureus (BCID) NOT DETECTED NOT DETECTED Final   Staphylococcus epidermidis NOT DETECTED NOT DETECTED Final   Staphylococcus lugdunensis NOT DETECTED NOT DETECTED Final   Streptococcus species NOT DETECTED NOT DETECTED Final   Streptococcus agalactiae NOT DETECTED NOT DETECTED Final   Streptococcus pneumoniae NOT DETECTED NOT DETECTED Final   Streptococcus pyogenes NOT DETECTED NOT DETECTED Final   A.calcoaceticus-baumannii NOT DETECTED NOT DETECTED Final   Bacteroides fragilis NOT DETECTED NOT DETECTED Final   Enterobacterales DETECTED (A) NOT DETECTED Final    Comment: Enterobacterales represent a large order of gram negative bacteria, not a single organism. CRITICAL RESULT CALLED TO, READ BACK BY AND VERIFIED WITH: RANKIN WENDI GOWER D AT 9371 04/24/24 RAM    Enterobacter cloacae complex NOT DETECTED NOT DETECTED Final   Escherichia coli DETECTED (A) NOT DETECTED Final    Comment: CRITICAL RESULT CALLED TO,  READ BACK BY AND VERIFIED WITH: RANKIN WENDI GOWER D AT 0628 04/24/24 RAM    Klebsiella aerogenes NOT DETECTED NOT DETECTED Final   Klebsiella oxytoca NOT DETECTED NOT DETECTED Final   Klebsiella pneumoniae NOT DETECTED NOT DETECTED Final   Proteus species NOT DETECTED NOT DETECTED Final   Salmonella species NOT DETECTED NOT DETECTED Final   Serratia marcescens NOT DETECTED NOT DETECTED Final   Haemophilus influenzae NOT DETECTED NOT DETECTED Final   Neisseria meningitidis NOT DETECTED NOT DETECTED Final   Pseudomonas aeruginosa NOT DETECTED NOT DETECTED Final   Stenotrophomonas maltophilia NOT DETECTED NOT DETECTED Final   Candida albicans NOT DETECTED NOT DETECTED Final   Candida auris NOT DETECTED NOT DETECTED Final   Candida glabrata NOT DETECTED NOT DETECTED Final   Candida krusei NOT DETECTED NOT DETECTED Final   Candida parapsilosis NOT DETECTED NOT  DETECTED Final   Candida tropicalis NOT DETECTED NOT DETECTED Final   Cryptococcus neoformans/gattii NOT DETECTED NOT DETECTED Final   CTX-M ESBL NOT DETECTED NOT DETECTED Final   Carbapenem resistance IMP NOT DETECTED NOT DETECTED Final   Carbapenem resistance KPC NOT DETECTED NOT DETECTED Final   Carbapenem resistance NDM NOT DETECTED NOT DETECTED Final   Carbapenem resist OXA 48 LIKE NOT DETECTED NOT DETECTED Final   Carbapenem resistance VIM NOT DETECTED NOT DETECTED Final    Comment: Performed at [facility], [address] [place], [place] [address]  Blood culture (routine x 2)     Status: None (Preliminary result)   Collection Time: 04/23/24  3:07 PM   Specimen: BLOOD  Result Value Ref Range Status   Specimen Description BLOOD BLOOD LEFT HAND  Final   Special Requests   Final    BOTTLES DRAWN AEROBIC AND ANAEROBIC Blood Culture adequate volume   Culture   Final    NO GROWTH 4 DAYS Performed at [facility], [address] [place], [place] [address]    Report Status PENDING  Incomplete  Urine  Culture (for pregnant, neutropenic or urologic patients or patients with an indwelling urinary catheter)     Status: Abnormal   Collection Time: 04/23/24  3:19 PM   Specimen: Urine, Random  Result Value Ref Range Status   Specimen Description   Final    URINE, RANDOM Performed at [facility], [address], [place], [place] [address]    Special Requests   Final    NONE Performed at [facility], [address] [place], [place] [address]    Culture >=100,000 COLONIES/mL ESCHERICHIA COLI (A)  Final   Report Status 04/26/2024 FINAL  Final   Organism ID, Bacteria ESCHERICHIA COLI (A)  Final      Susceptibility   Escherichia coli - MIC*    AMPICILLIN 8 SENSITIVE Sensitive     CEFAZOLIN  (URINE) Value in next row Sensitive      2 SENSITIVEThis is a modified FDA-approved test that has been validated and its performance characteristics determined by the reporting laboratory.  This laboratory is certified under the Clinical Laboratory Improvement Amendments CLIA as qualified to perform high complexity clinical laboratory testing.    CEFEPIME  Value in next row Sensitive      2 SENSITIVEThis is a modified FDA-approved test that has been validated and its performance characteristics determined by the reporting laboratory.  This laboratory is certified under the Clinical Laboratory Improvement Amendments CLIA as qualified to perform high complexity clinical laboratory testing.    ERTAPENEM Value in next row Sensitive      2 SENSITIVEThis is a modified FDA-approved test that has been validated and its performance characteristics determined by the reporting laboratory.  This laboratory is certified under the Clinical Laboratory Improvement Amendments CLIA as qualified to perform high complexity clinical laboratory testing.    CEFTRIAXONE  Value in next row Sensitive      2 SENSITIVEThis is a modified FDA-approved test that has been validated and its performance characteristics  determined by the reporting laboratory.  This laboratory is certified under the Clinical Laboratory Improvement Amendments CLIA as qualified to perform high complexity clinical laboratory testing.    CIPROFLOXACIN Value in next row Sensitive      2 SENSITIVEThis is a modified FDA-approved test that has been validated and its performance characteristics determined by the reporting laboratory.  This laboratory is certified under the Clinical Laboratory Improvement Amendments CLIA as qualified  to perform high complexity clinical laboratory testing.    GENTAMICIN Value in next row Sensitive      2 SENSITIVEThis is a modified FDA-approved test that has been validated and its performance characteristics determined by the reporting laboratory.  This laboratory is certified under the Clinical Laboratory Improvement Amendments CLIA as qualified to perform high complexity clinical laboratory testing.    NITROFURANTOIN Value in next row Sensitive      2 SENSITIVEThis is a modified FDA-approved test that has been validated and its performance characteristics determined by the reporting laboratory.  This laboratory is certified under the Clinical Laboratory Improvement Amendments CLIA as qualified to perform high complexity clinical laboratory testing.    TRIMETH /SULFA  Value in next row Sensitive      2 SENSITIVEThis is a modified FDA-approved test that has been validated and its performance characteristics determined by the reporting laboratory.  This laboratory is certified under the Clinical Laboratory Improvement Amendments CLIA as qualified to perform high complexity clinical laboratory testing.    AMPICILLIN/SULBACTAM Value in next row Sensitive      2 SENSITIVEThis is a modified FDA-approved test that has been validated and its performance characteristics determined by the reporting laboratory.  This laboratory is certified under the Clinical Laboratory Improvement Amendments CLIA as qualified to perform high  complexity clinical laboratory testing.    PIP/TAZO Value in next row Sensitive      <=4 SENSITIVEThis is a modified FDA-approved test that has been validated and its performance characteristics determined by the reporting laboratory.  This laboratory is certified under the Clinical Laboratory Improvement Amendments CLIA as qualified to perform high complexity clinical laboratory testing.    MEROPENEM Value in next row Sensitive      <=4 SENSITIVEThis is a modified FDA-approved test that has been validated and its performance characteristics determined by the reporting laboratory.  This laboratory is certified under the Clinical Laboratory Improvement Amendments CLIA as qualified to perform high complexity clinical laboratory testing.    * >=100,000 COLONIES/mL ESCHERICHIA COLI    Studies/Results: DG Chest 2 View Result Date: 04/25/2024 EXAM: 2 VIEW(S) XRAY OF THE CHEST 04/25/2024 03:12:34 PM COMPARISON: 03/17/2024 CLINICAL HISTORY: Shortness of breath FINDINGS: LUNGS AND PLEURA: New platelike atelectasis in the right lung base and left mid lung. Small effusions are noted bilaterally. No pneumothorax. HEART AND MEDIASTINUM: Aortic atherosclerosis. No acute abnormality of the cardiac and mediastinal silhouettes. BONES AND SOFT TISSUES: Scattered retained metallic BBs. No acute osseous abnormality. IMPRESSION: 1. New platelike atelectasis in the right lung base and left mid lung. 2. Small bilateral pleural effusions. Electronically signed by: Oneil Devonshire MD 04/25/2024 07:35 PM EST RP Workstation: [id number]    Assessment/Plan: Edward Macias is a 63 y.o. male with history of multiple medical problems including hypertension asthma COPD admitted with 4 days of back pain fevers and chills.  He was found to have pyelonephritis with acute kidney injury and E. coli bacteremia and UTI.     Recommendations Will change ceftriaxone  to levofloxacin  for better renal penetration given persistent wbc although  clinically improving.  He will likely need a relatively prolonged course.   If worsens or leukocytosis does not improve in next 1-2 days without other obvious etiology can repeat CT to look for development of renal abscess.  Unclear why he developed a UTI this magnitude.  His prostate looked normal per report on the CT.  He will need at some point PSA checked although I would not check it in the acute setting.  Thank you very much for the consult. Will follow with you.  Alm SHAUNNA Needle   04/27/2024, 1:10 PM

## 2024-04-28 ENCOUNTER — Other Ambulatory Visit: Payer: Self-pay

## 2024-04-28 DIAGNOSIS — N12 Tubulo-interstitial nephritis, not specified as acute or chronic: Secondary | ICD-10-CM | POA: Diagnosis not present

## 2024-04-28 LAB — CBC WITH DIFFERENTIAL/PLATELET
Abs Immature Granulocytes: 0.84 K/uL — ABNORMAL HIGH (ref 0.00–0.07)
Basophils Absolute: 0.1 K/uL (ref 0.0–0.1)
Basophils Relative: 0 %
Eosinophils Absolute: 0 K/uL (ref 0.0–0.5)
Eosinophils Relative: 0 %
HCT: 27.2 % — ABNORMAL LOW (ref 39.0–52.0)
Hemoglobin: 9.7 g/dL — ABNORMAL LOW (ref 13.0–17.0)
Immature Granulocytes: 3 %
Lymphocytes Relative: 7 %
Lymphs Abs: 1.7 K/uL (ref 0.7–4.0)
MCH: 31.5 pg (ref 26.0–34.0)
MCHC: 35.7 g/dL (ref 30.0–36.0)
MCV: 88.3 fL (ref 80.0–100.0)
Monocytes Absolute: 2.3 K/uL — ABNORMAL HIGH (ref 0.1–1.0)
Monocytes Relative: 10 %
Neutro Abs: 19.5 K/uL — ABNORMAL HIGH (ref 1.7–7.7)
Neutrophils Relative %: 80 %
Platelets: 374 K/uL (ref 150–400)
RBC: 3.08 MIL/uL — ABNORMAL LOW (ref 4.22–5.81)
RDW: 15.8 % — ABNORMAL HIGH (ref 11.5–15.5)
WBC: 24.4 K/uL — ABNORMAL HIGH (ref 4.0–10.5)
nRBC: 0 % (ref 0.0–0.2)

## 2024-04-28 LAB — BASIC METABOLIC PANEL WITH GFR
Anion gap: 10 (ref 5–15)
BUN: 13 mg/dL (ref 8–23)
CO2: 27 mmol/L (ref 22–32)
Calcium: 7.8 mg/dL — ABNORMAL LOW (ref 8.9–10.3)
Chloride: 96 mmol/L — ABNORMAL LOW (ref 98–111)
Creatinine, Ser: 1.25 mg/dL — ABNORMAL HIGH (ref 0.61–1.24)
GFR, Estimated: >60 mL/min (ref >60)
Glucose, Bld: 103 mg/dL — ABNORMAL HIGH (ref 70–99)
Potassium: 4.1 mmol/L (ref 3.5–5.1)
Sodium: 133 mmol/L — ABNORMAL LOW (ref 135–145)

## 2024-04-28 MED ORDER — IPRATROPIUM-ALBUTEROL 0.5-2.5 (3) MG/3ML IN SOLN
3.0000 mL | Freq: Three times a day (TID) | RESPIRATORY_TRACT | 1 refills | Status: AC
  Filled 2024-04-28: qty 360, 40d supply, fill #0

## 2024-04-28 MED ORDER — AMLODIPINE BESYLATE 10 MG PO TABS
10.0000 mg | ORAL_TABLET | Freq: Every day | ORAL | 1 refills | Status: AC
  Filled 2024-04-28: qty 30, 30d supply, fill #0

## 2024-04-28 MED ORDER — LEVOFLOXACIN 750 MG PO TABS
750.0000 mg | ORAL_TABLET | Freq: Every day | ORAL | 0 refills | Status: DC
  Filled 2024-04-28: qty 12, 12d supply, fill #0

## 2024-04-28 MED ORDER — FLUTICASONE-SALMETEROL 250-50 MCG/ACT IN AEPB
1.0000 | INHALATION_SPRAY | Freq: Every day | RESPIRATORY_TRACT | 0 refills | Status: AC
  Filled 2024-04-28: qty 60, 30d supply, fill #0

## 2024-04-28 MED FILL — Tamsulosin HCl Cap 0.4 MG: 0.4000 mg | ORAL | 30 days supply | Qty: 30 | Fill #0 | Status: AC

## 2024-04-28 NOTE — Progress Notes (Addendum)
 02 sats on room air while sitting: 94%  02 sats on room air with ambulation: 76%  O2 satsz on 2L with ambulation: 95%

## 2024-04-28 NOTE — Plan of Care (Signed)
  Problem: Education: Goal: Knowledge of General Education information will improve Description: Including pain rating scale, medication(s)/side effects and non-pharmacologic comfort measures Outcome: Progressing   Problem: Clinical Measurements: Goal: Ability to maintain clinical measurements within normal limits will improve Outcome: Progressing   Problem: Activity: Goal: Risk for activity intolerance will decrease Outcome: Progressing   Problem: Nutrition: Goal: Adequate nutrition will be maintained Outcome: Progressing   Problem: Coping: Goal: Level of anxiety will decrease Outcome: Progressing   Problem: Pain Managment: Goal: General experience of comfort will improve and/or be controlled Outcome: Progressing   Problem: Safety: Goal: Ability to remain free from injury will improve Outcome: Progressing

## 2024-04-28 NOTE — Progress Notes (Signed)
 PT Cancellation Note  Patient Details Name: Macon Sandiford MRN: [id number] DOB: 1960/09/14   Cancelled Treatment:    Reason Eval/Treat Not Completed: PT screened, no needs identified, will sign off Orders received, chart reviewed. Per chart and RN, patient is independent and at baseline other than new O2 requirement. No skilled PT needs identified. PT will complete orders.   Maryanne Finder, PT, DPT Physical Therapist -   [facility]   Angelis Gates A Vander Kueker 04/28/2024, 9:00 AM

## 2024-04-28 NOTE — TOC Initial Note (Signed)
 Transition of Care [facility]) - Initial/Assessment Note    Patient Details  Name: Edward Macias MRN: [id number] Date of Birth: 11/28/1960  Transition of Care [facility]) CM/SW Contact:    Nathanael CHRISTELLA Ring, RN Phone Number: 04/28/2024, 10:36 AM  Clinical Narrative:                 Met with patient at the bedside.  Introduced self and explained role in discharge planning.  Provider is going to discharge patient home today and he needs home O2.  His insurance is not in-network with our regular DME agency so we have to go through another company.  He is in-network with [employer].  He will have a copay and the company reports that he needs to put a credit card on file, he says that he has a debit card.  He lives with his girlfriend in [place], he drove himself here and is comfortable driving himself home.  He is independent in ADL's.  CM will get oxygen order signed and send to [facility] representative, Rosina, to get oxygen delivered to the bedside.  Expected Discharge Plan: Home/Self Care Barriers to Discharge: Equipment Delay   Patient Goals and CMS Choice            Expected Discharge Plan and Services   Discharge Planning Services: CM Consult   Living arrangements for the past 2 months: Apartment                 DME Arranged: Oxygen DME Agency: Other - Comment [employer]) Date DME Agency Contacted: 04/28/24 Time DME Agency Contacted: 1000 Representative spoke with at DME Agency: Rosina HH Arranged: NA          Prior Living Arrangements/Services Living arrangements for the past 2 months: Apartment Lives with:: Significant Other Patient language and need for interpreter reviewed:: Yes Do you feel safe going back to the place where you live?: Yes      Need for Family Participation in Patient Care: Yes (Comment) Care giver support system in place?: Yes (comment)   Criminal Activity/Legal Involvement Pertinent to Current Situation/Hospitalization: No - Comment as needed  Activities of  Daily Living   ADL Screening (condition at time of admission) Independently performs ADLs?: Yes (appropriate for developmental age) Is the patient deaf or have difficulty hearing?: No Does the patient have difficulty seeing, even when wearing glasses/contacts?: No Does the patient have difficulty concentrating, remembering, or making decisions?: No  Permission Sought/Granted   Permission granted to share information with : Yes, Verbal Permission Granted     Permission granted to share info w AGENCY: [employer]        Emotional Assessment Appearance:: Appears stated age Attitude/Demeanor/Rapport: Engaged Affect (typically observed): Accepting Orientation: : Oriented to Self, Oriented to Place, Oriented to  Time, Oriented to Situation Alcohol / Substance Use: Not Applicable Psych Involvement: No (comment)  Admission diagnosis:  Pyelonephritis [N12] Sepsis, due to unspecified organism, unspecified whether acute organ dysfunction present [facility]) [A41.9] Patient Active Problem List   Diagnosis Date Noted   Hypomagnesemia 04/26/2024   Hyponatremia 04/24/2024   Hypokalemia 04/24/2024   AKI (acute kidney injury) 04/24/2024   E. coli septicemia (HCC) 04/24/2024   Acute pyelonephritis 04/23/2024   Pyelonephritis 04/23/2024   COPD with acute exacerbation (HCC) 03/18/2024   COPD exacerbation (HCC) 03/03/2024   Right inguinal hernia 03/20/2022   Recurrent left inguinal hernia 03/06/2022   COPD (chronic obstructive pulmonary disease) (HCC) 04/17/2020   Tobacco abuse 04/17/2020   History of prediabetes 04/17/2020  Elevated lipids 12/13/2019   Encounter to establish care 11/24/2019   History of asthma 11/24/2019   Chronic right hip pain 11/24/2019   Peripheral neuropathy 04/10/2019   Wheezing 04/09/2019   Severe sepsis (HCC) 07/24/2018   PCP:  Freddrick No Pharmacy:   [facility] [address]), [place] - [address] [place] ([address]) [place]  [address] Phone: [phone] Fax: [phone]  [facility] [address], [place] - [address] [place] [address] Phone: [phone] Fax: [phone]  [facility] [address] [place] [address] Phone: [phone] Fax: [phone]     Social Drivers of Health (SDOH) Social History: SDOH Screenings   Food Insecurity: No Food Insecurity (04/23/2024)  Housing: Low Risk (04/23/2024)  Transportation Needs: No Transportation Needs (04/23/2024)  Utilities: Not At Risk (04/23/2024)  Social Connections: Socially Isolated (04/23/2024)  Tobacco Use: High Risk (04/23/2024)   SDOH Interventions: Social Connections Interventions: Inpatient TOC, Other (Comment) (Resources added to AVS)   Readmission Risk Interventions     No data to display

## 2024-04-28 NOTE — Discharge Summary (Addendum)
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   --  2.9  --   --    Liver Function Tests: Recent Labs  Lab 04/23/24 1244  AST 30  ALT 11  ALKPHOS 156*  BILITOT 0.9  PROT 7.3  ALBUMIN 2.9*   CBG: No results for input(s): GLUCAP in the last 168 hours.  Discharge time spent: greater than 30 minutes.  Signed: Aimee Somerset, MD [facility] 04/28/2024

## 2024-04-29 LAB — CULTURE, BLOOD (ROUTINE X 2)
Culture  Setup Time: NO GROWTH
Special Requests: ADEQUATE

## 2024-04-30 DIAGNOSIS — N151 Renal and perinephric abscess: Secondary | ICD-10-CM | POA: Insufficient documentation

## 2024-05-01 ENCOUNTER — Emergency Department

## 2024-05-01 ENCOUNTER — Other Ambulatory Visit: Payer: Self-pay

## 2024-05-01 ENCOUNTER — Inpatient Hospital Stay
Admission: EM | Admit: 2024-05-01 | Discharge: 2024-05-11 | DRG: 871 | Disposition: A | Discharge status: Home / Self Care | Attending: Internal Medicine | Admitting: Internal Medicine

## 2024-05-01 DIAGNOSIS — N10 Acute tubulo-interstitial nephritis: Secondary | ICD-10-CM | POA: Diagnosis present

## 2024-05-01 DIAGNOSIS — E8721 Acute metabolic acidosis: Secondary | ICD-10-CM | POA: Diagnosis present

## 2024-05-01 DIAGNOSIS — T508X5A Adverse effect of diagnostic agents, initial encounter: Secondary | ICD-10-CM | POA: Diagnosis present

## 2024-05-01 DIAGNOSIS — N1411 Contrast-induced nephropathy: Secondary | ICD-10-CM | POA: Diagnosis present

## 2024-05-01 DIAGNOSIS — B9620 Unspecified Escherichia coli [E. coli] as the cause of diseases classified elsewhere: Secondary | ICD-10-CM

## 2024-05-01 DIAGNOSIS — N12 Tubulo-interstitial nephritis, not specified as acute or chronic: Secondary | ICD-10-CM | POA: Diagnosis present

## 2024-05-01 DIAGNOSIS — B1920 Unspecified viral hepatitis C without hepatic coma: Secondary | ICD-10-CM | POA: Diagnosis present

## 2024-05-01 DIAGNOSIS — A419 Sepsis, unspecified organism: Principal | ICD-10-CM | POA: Diagnosis present

## 2024-05-01 DIAGNOSIS — K7031 Alcoholic cirrhosis of liver with ascites: Secondary | ICD-10-CM | POA: Diagnosis present

## 2024-05-01 DIAGNOSIS — F1090 Alcohol use, unspecified, uncomplicated: Secondary | ICD-10-CM | POA: Insufficient documentation

## 2024-05-01 DIAGNOSIS — N138 Other obstructive and reflux uropathy: Secondary | ICD-10-CM | POA: Diagnosis present

## 2024-05-01 DIAGNOSIS — A4151 Sepsis due to Escherichia coli [E. coli]: Secondary | ICD-10-CM | POA: Diagnosis present

## 2024-05-01 DIAGNOSIS — N179 Acute kidney failure, unspecified: Secondary | ICD-10-CM | POA: Diagnosis present

## 2024-05-01 DIAGNOSIS — F1010 Alcohol abuse, uncomplicated: Secondary | ICD-10-CM | POA: Diagnosis present

## 2024-05-01 DIAGNOSIS — N401 Enlarged prostate with lower urinary tract symptoms: Secondary | ICD-10-CM | POA: Diagnosis present

## 2024-05-01 DIAGNOSIS — G629 Polyneuropathy, unspecified: Secondary | ICD-10-CM | POA: Diagnosis present

## 2024-05-01 DIAGNOSIS — N151 Renal and perinephric abscess: Secondary | ICD-10-CM | POA: Diagnosis present

## 2024-05-01 DIAGNOSIS — F17210 Nicotine dependence, cigarettes, uncomplicated: Secondary | ICD-10-CM | POA: Diagnosis present

## 2024-05-01 DIAGNOSIS — J4489 Other specified chronic obstructive pulmonary disease: Secondary | ICD-10-CM | POA: Diagnosis present

## 2024-05-01 DIAGNOSIS — J449 Chronic obstructive pulmonary disease, unspecified: Secondary | ICD-10-CM | POA: Diagnosis not present

## 2024-05-01 DIAGNOSIS — E871 Hypo-osmolality and hyponatremia: Secondary | ICD-10-CM | POA: Diagnosis present

## 2024-05-01 DIAGNOSIS — I10 Essential (primary) hypertension: Secondary | ICD-10-CM | POA: Diagnosis present

## 2024-05-01 DIAGNOSIS — K5900 Constipation, unspecified: Secondary | ICD-10-CM | POA: Diagnosis present

## 2024-05-01 DIAGNOSIS — Z9981 Dependence on supplemental oxygen: Secondary | ICD-10-CM

## 2024-05-01 DIAGNOSIS — N390 Urinary tract infection, site not specified: Secondary | ICD-10-CM | POA: Diagnosis not present

## 2024-05-01 DIAGNOSIS — R7881 Bacteremia: Secondary | ICD-10-CM

## 2024-05-01 DIAGNOSIS — Z8701 Personal history of pneumonia (recurrent): Secondary | ICD-10-CM | POA: Diagnosis not present

## 2024-05-01 DIAGNOSIS — B182 Chronic viral hepatitis C: Secondary | ICD-10-CM | POA: Diagnosis not present

## 2024-05-01 DIAGNOSIS — R6520 Severe sepsis without septic shock: Secondary | ICD-10-CM | POA: Diagnosis present

## 2024-05-01 DIAGNOSIS — Z1152 Encounter for screening for COVID-19: Secondary | ICD-10-CM | POA: Diagnosis not present

## 2024-05-01 DIAGNOSIS — Z7951 Long term (current) use of inhaled steroids: Secondary | ICD-10-CM | POA: Diagnosis not present

## 2024-05-01 DIAGNOSIS — Z87440 Personal history of urinary (tract) infections: Secondary | ICD-10-CM

## 2024-05-01 DIAGNOSIS — R10A0 Flank pain, unspecified side: Secondary | ICD-10-CM

## 2024-05-01 DIAGNOSIS — Z720 Tobacco use: Secondary | ICD-10-CM | POA: Diagnosis present

## 2024-05-01 DIAGNOSIS — Z8709 Personal history of other diseases of the respiratory system: Secondary | ICD-10-CM

## 2024-05-01 DIAGNOSIS — D72829 Elevated white blood cell count, unspecified: Secondary | ICD-10-CM | POA: Diagnosis not present

## 2024-05-01 LAB — RESP PANEL BY RT-PCR (RSV, FLU A&B, COVID)  RVPGX2
Influenza A by PCR: NEGATIVE
Influenza B by PCR: NEGATIVE
Resp Syncytial Virus by PCR: NEGATIVE
SARS Coronavirus 2 by RT PCR: NEGATIVE

## 2024-05-01 LAB — LIPASE, BLOOD: Lipase: 53 U/L — ABNORMAL HIGH (ref 11–51)

## 2024-05-01 LAB — COMPREHENSIVE METABOLIC PANEL WITH GFR
ALT: 39 U/L (ref 0–44)
AST: 46 U/L — ABNORMAL HIGH (ref 15–41)
Albumin: 2.8 g/dL — ABNORMAL LOW (ref 3.5–5.0)
Alkaline Phosphatase: 84 U/L (ref 38–126)
Anion gap: 15 (ref 5–15)
BUN: 20 mg/dL (ref 8–23)
CO2: 23 mmol/L (ref 22–32)
Calcium: 8.5 mg/dL — ABNORMAL LOW (ref 8.9–10.3)
Chloride: 96 mmol/L — ABNORMAL LOW (ref 98–111)
Creatinine, Ser: 1.75 mg/dL — ABNORMAL HIGH (ref 0.61–1.24)
GFR, Estimated: 43 mL/min — ABNORMAL LOW
Glucose, Bld: 109 mg/dL — ABNORMAL HIGH (ref 70–99)
Potassium: 4 mmol/L (ref 3.5–5.1)
Sodium: 133 mmol/L — ABNORMAL LOW (ref 135–145)
Total Bilirubin: 1.3 mg/dL — ABNORMAL HIGH (ref 0.0–1.2)
Total Protein: 7 g/dL (ref 6.5–8.1)

## 2024-05-01 LAB — CBC WITH DIFFERENTIAL/PLATELET
Abs Immature Granulocytes: 0.97 K/uL — ABNORMAL HIGH (ref 0.00–0.07)
Basophils Absolute: 0.1 K/uL (ref 0.0–0.1)
Basophils Relative: 0 %
Eosinophils Absolute: 0 K/uL (ref 0.0–0.5)
Eosinophils Relative: 0 %
HCT: 31.8 % — ABNORMAL LOW (ref 39.0–52.0)
Hemoglobin: 10.8 g/dL — ABNORMAL LOW (ref 13.0–17.0)
Immature Granulocytes: 3 %
Lymphocytes Relative: 6 %
Lymphs Abs: 1.8 K/uL (ref 0.7–4.0)
MCH: 31 pg (ref 26.0–34.0)
MCHC: 34 g/dL (ref 30.0–36.0)
MCV: 91.4 fL (ref 80.0–100.0)
Monocytes Absolute: 1.8 K/uL — ABNORMAL HIGH (ref 0.1–1.0)
Monocytes Relative: 7 %
Neutro Abs: 23.6 K/uL — ABNORMAL HIGH (ref 1.7–7.7)
Neutrophils Relative %: 84 %
Platelets: 634 K/uL — ABNORMAL HIGH (ref 150–400)
RBC: 3.48 MIL/uL — ABNORMAL LOW (ref 4.22–5.81)
RDW: 16.4 % — ABNORMAL HIGH (ref 11.5–15.5)
Smear Review: NORMAL
WBC: 28.3 K/uL — ABNORMAL HIGH (ref 4.0–10.5)
nRBC: 0 % (ref 0.0–0.2)

## 2024-05-01 LAB — PRO BRAIN NATRIURETIC PEPTIDE: Pro Brain Natriuretic Peptide: 416 pg/mL — ABNORMAL HIGH

## 2024-05-01 LAB — CBC
HCT: 28.4 % — ABNORMAL LOW (ref 39.0–52.0)
HCT: 28.2 % — ABNORMAL LOW (ref 39.0–52.0)
Hemoglobin: 9.7 g/dL — ABNORMAL LOW (ref 13.0–17.0)
Hemoglobin: 9.4 g/dL — ABNORMAL LOW (ref 13.0–17.0)
MCH: 31.1 pg (ref 26.0–34.0)
MCH: 30.5 pg (ref 26.0–34.0)
MCHC: 34.2 g/dL (ref 30.0–36.0)
MCHC: 33.3 g/dL (ref 30.0–36.0)
MCV: 91 fL (ref 80.0–100.0)
MCV: 91.6 fL (ref 80.0–100.0)
Platelets: 538 K/uL — ABNORMAL HIGH (ref 150–400)
Platelets: 547 K/uL — ABNORMAL HIGH (ref 150–400)
RBC: 3.12 MIL/uL — ABNORMAL LOW (ref 4.22–5.81)
RBC: 3.08 MIL/uL — ABNORMAL LOW (ref 4.22–5.81)
RDW: 16.3 % — ABNORMAL HIGH (ref 11.5–15.5)
RDW: 16.3 % — ABNORMAL HIGH (ref 11.5–15.5)
WBC: 30.1 K/uL — ABNORMAL HIGH (ref 4.0–10.5)
WBC: 30.9 K/uL — ABNORMAL HIGH (ref 4.0–10.5)
nRBC: 0 % (ref 0.0–0.2)
nRBC: 0 % (ref 0.0–0.2)

## 2024-05-01 LAB — URINALYSIS, W/ REFLEX TO CULTURE (INFECTION SUSPECTED)
Bilirubin Urine: NEGATIVE
Glucose, UA: NEGATIVE mg/dL
Ketones, ur: NEGATIVE mg/dL
Nitrite: NEGATIVE
Protein, ur: 30 mg/dL — AB
Specific Gravity, Urine: 1.008 (ref 1.005–1.030)
WBC, UA: >50 WBC/hpf (ref 0–5)
pH: 6 (ref 5.0–8.0)

## 2024-05-01 LAB — CREATININE, SERUM
Creatinine, Ser: 1.76 mg/dL — ABNORMAL HIGH (ref 0.61–1.24)
GFR, Estimated: 43 mL/min — ABNORMAL LOW

## 2024-05-01 LAB — LACTIC ACID, PLASMA
Lactic Acid, Venous: 1.1 mmol/L (ref 0.5–1.9)
Lactic Acid, Venous: 1.9 mmol/L (ref 0.5–1.9)

## 2024-05-01 LAB — PROTIME-INR
INR: 1.2 (ref 0.8–1.2)
Prothrombin Time: 16.2 s — ABNORMAL HIGH (ref 11.4–15.2)

## 2024-05-01 LAB — APTT: aPTT: 35 s (ref 24–36)

## 2024-05-01 MED ORDER — ONDANSETRON HCL 4 MG PO TABS: 4.0000 mg | ORAL_TABLET | Freq: Four times a day (QID) | ORAL | Status: DC | PRN

## 2024-05-01 MED ORDER — ACETAMINOPHEN 325 MG PO TABS
650.0000 mg | ORAL_TABLET | Freq: Four times a day (QID) | ORAL | Status: DC | PRN
  Administered 2024-05-02 – 2024-05-09 (×12): 650 mg via ORAL
  Filled 2024-05-01 (×10): qty 2

## 2024-05-01 MED ORDER — SODIUM CHLORIDE 0.9 % IV SOLN
2.0000 g | Freq: Once | INTRAVENOUS | Status: AC
  Administered 2024-05-01: 2 g via INTRAVENOUS
  Filled 2024-05-01: qty 12.5

## 2024-05-01 MED ORDER — ONDANSETRON HCL 4 MG/2ML IJ SOLN
4.0000 mg | Freq: Once | INTRAMUSCULAR | Status: AC
  Administered 2024-05-01: 4 mg via INTRAVENOUS
  Filled 2024-05-01: qty 2

## 2024-05-01 MED ORDER — SODIUM CHLORIDE 0.9 % IV SOLN: 2.0000 g | Freq: Once | INTRAVENOUS | Status: DC

## 2024-05-01 MED ORDER — SODIUM CHLORIDE 0.9 % IV SOLN
2.0000 g | Freq: Two times a day (BID) | INTRAVENOUS | Status: DC
  Administered 2024-05-02: 2 g via INTRAVENOUS
  Filled 2024-05-01 (×2): qty 12.5

## 2024-05-01 MED ORDER — LACTATED RINGERS IV SOLN
150.0000 mL/h | INTRAVENOUS | Status: DC
  Administered 2024-05-01 – 2024-05-02 (×2): 150 mL/h via INTRAVENOUS

## 2024-05-01 MED ORDER — FLUTICASONE FUROATE-VILANTEROL 200-25 MCG/ACT IN AEPB
1.0000 | INHALATION_SPRAY | Freq: Every day | RESPIRATORY_TRACT | Status: DC
  Administered 2024-05-02 – 2024-05-11 (×10): 1 via RESPIRATORY_TRACT
  Filled 2024-05-01 (×2): qty 28

## 2024-05-01 MED ORDER — ALBUTEROL SULFATE (2.5 MG/3ML) 0.083% IN NEBU: 2.5000 mg | INHALATION_SOLUTION | Freq: Four times a day (QID) | RESPIRATORY_TRACT | Status: DC | PRN

## 2024-05-01 MED ORDER — AMLODIPINE BESYLATE 5 MG PO TABS
10.0000 mg | ORAL_TABLET | Freq: Every day | ORAL | Status: DC
  Administered 2024-05-02 – 2024-05-05 (×4): 10 mg via ORAL
  Filled 2024-05-01 (×4): qty 2

## 2024-05-01 MED ORDER — ENOXAPARIN SODIUM 40 MG/0.4ML IJ SOSY
40.0000 mg | PREFILLED_SYRINGE | INTRAMUSCULAR | Status: DC
  Administered 2024-05-01 – 2024-05-03 (×2): 40 mg via SUBCUTANEOUS
  Filled 2024-05-01 (×3): qty 0.4

## 2024-05-01 MED ORDER — IOHEXOL 300 MG/ML  SOLN
100.0000 mL | Freq: Once | INTRAMUSCULAR | Status: AC | PRN
  Administered 2024-05-01: 100 mL via INTRAVENOUS

## 2024-05-01 MED ORDER — LACTATED RINGERS IV BOLUS (SEPSIS)
250.0000 mL | Freq: Once | INTRAVENOUS | Status: AC
  Administered 2024-05-01: 250 mL via INTRAVENOUS

## 2024-05-01 MED ORDER — ONDANSETRON HCL 4 MG/2ML IJ SOLN: 4.0000 mg | Freq: Four times a day (QID) | INTRAMUSCULAR | Status: DC | PRN

## 2024-05-01 MED ORDER — ACETAMINOPHEN 650 MG RE SUPP: 650.0000 mg | Freq: Four times a day (QID) | RECTAL | Status: DC | PRN

## 2024-05-01 MED ORDER — IPRATROPIUM-ALBUTEROL 0.5-2.5 (3) MG/3ML IN SOLN
3.0000 mL | Freq: Two times a day (BID) | RESPIRATORY_TRACT | Status: DC
  Administered 2024-05-02: 3 mL via RESPIRATORY_TRACT
  Filled 2024-05-01: qty 3

## 2024-05-01 MED ORDER — LACTATED RINGERS IV BOLUS (SEPSIS)
1000.0000 mL | Freq: Once | INTRAVENOUS | Status: AC
  Administered 2024-05-01: 1000 mL via INTRAVENOUS

## 2024-05-01 MED ORDER — LACTATED RINGERS IV SOLN: INTRAVENOUS | Status: AC

## 2024-05-01 MED ORDER — TAMSULOSIN HCL 0.4 MG PO CAPS
0.4000 mg | ORAL_CAPSULE | Freq: Every day | ORAL | Status: DC
  Administered 2024-05-01 – 2024-05-10 (×10): 0.4 mg via ORAL
  Filled 2024-05-01 (×8): qty 1

## 2024-05-01 MED ORDER — MORPHINE SULFATE (PF) 2 MG/ML IV SOLN
2.0000 mg | INTRAVENOUS | Status: DC | PRN
  Administered 2024-05-02 (×3): 2 mg via INTRAVENOUS
  Filled 2024-05-01 (×4): qty 1

## 2024-05-01 MED ORDER — METRONIDAZOLE 500 MG/100ML IV SOLN
500.0000 mg | Freq: Once | INTRAVENOUS | Status: AC
  Administered 2024-05-01: 500 mg via INTRAVENOUS
  Filled 2024-05-01: qty 100

## 2024-05-01 MED ORDER — IPRATROPIUM-ALBUTEROL 0.5-2.5 (3) MG/3ML IN SOLN
3.0000 mL | Freq: Three times a day (TID) | RESPIRATORY_TRACT | Status: DC
  Administered 2024-05-01: 3 mL via RESPIRATORY_TRACT
  Filled 2024-05-01: qty 3

## 2024-05-01 MED ORDER — HYDROMORPHONE HCL 1 MG/ML IJ SOLN
0.5000 mg | Freq: Once | INTRAMUSCULAR | Status: AC
  Administered 2024-05-01: 0.5 mg via INTRAVENOUS
  Filled 2024-05-01: qty 0.5

## 2024-05-01 NOTE — ED Triage Notes (Addendum)
 Pt comes with c/o belly pain since last week. Pt was seen here then and given meds. Pt unable to keep meds down. Pt states infection in his kidneys. Pt states no N/V. Pt states all lower pain. Pt does wear O2 at night.

## 2024-05-01 NOTE — ED Notes (Signed)
 Xray finished at [facility], using urinal

## 2024-05-01 NOTE — ED Notes (Signed)
 To CT via stretcher, IVF bolus continues

## 2024-05-01 NOTE — ED Provider Notes (Signed)
 "  [facility] Provider Note    Event Date/Time   First MD Initiated Contact with Patient 05/01/24 1547     (approximate)   History   Abdominal Pain   HPI  Edward Macias is a 63 y.o. male with a past medical history of COPD, hypertension, BPH and recent admission to [facility] for E. coli pyelonephritis discharged on Levaquin  with right renal microabscesses who presents to our facility with bilateral flank pain, right lower quadrant abdominal pain, fevers chills and generalized weakness.  Patient presented to our facility initially on 04/23/2024 and was hospitalized to 04/28/2024 for sepsis secondary to pyelonephritis.  He subsequently went to [facility] and his sepsis was initially managed with ceftriaxone  and transition to p.o. Levaquin .  Patient says that he did well the day of discharge but today developed generalized weakness ongoing pain and urinary hesitancy.  He states that he did initially take his dose of Levaquin  but did have an episode of nausea afterwards.  He denies any scrotal or penile swelling.  No SI HI or AVH S     Physical Exam   Triage Vital Signs: ED Triage Vitals  Encounter Vitals Group     BP 05/01/24 1140 113/69     Girls Systolic BP Percentile --      Girls Diastolic BP Percentile --      Boys Systolic BP Percentile --      Boys Diastolic BP Percentile --      Pulse Rate 05/01/24 1140 (!) 105     Resp 05/01/24 1140 20     Temp 05/01/24 1140 98.5 F (36.9 C)     Temp src --      SpO2 05/01/24 1140 92 %     Weight 05/01/24 1139 149 lb (67.6 kg)     Height 05/01/24 1139 5' 9 (1.753 m)     Head Circumference --      Peak Flow --      Pain Score 05/01/24 1139 8     Pain Loc --      Pain Education --      Exclude from Growth Chart --     Most recent vital signs: Vitals:   05/01/24 1945 05/01/24 2141  BP: (!) 148/69   Pulse: (!) 103   Resp: 16   Temp: 97.8 F (36.6 C)   SpO2: 100% 98%    Nursing Triage Note reviewed. Vital signs  reviewed and patients oxygen saturation is normoxix  General: Patient is well nourished, well developed, awake and alert, crying, appears uncomfortable, acute distress Head: Normocephalic and atraumatic Eyes: Normal inspection, extraocular muscles intact, no conjunctival pallor Ear, nose, throat: Normal external exam Neck: Normal range of motion Respiratory: Patient is in mild respiratory distress, lungs CTAB Cardiovascular: Patient is tachycardic, RR GI: Abdomen soft, tender to palpation throughout, bilateral CVA tenderness Back: Normal inspection of the back with good strength and range of motion throughout all ext Extremities: pulses intact with good cap refills, no LE pitting edema or calf tenderness Neuro: The patient is alert and oriented to person, place, and time, appropriately conversive, with 5/5 bilat UE/LE strength, no gross motor or sensory defects noted. Coordination appears to be adequate. Skin: Warm, dry, and intact Psych: anxious mood and affect, no SI or HI  ED Results / Procedures / Treatments   Labs (all labs ordered are listed, but only abnormal results are displayed) Labs Reviewed  LIPASE, BLOOD - Abnormal; Notable for the following components:  Result Value   Lipase 53 (*)    All other components within normal limits  COMPREHENSIVE METABOLIC PANEL WITH GFR - Abnormal; Notable for the following components:   Sodium 133 (*)    Chloride 96 (*)    Glucose, Bld 109 (*)    Creatinine, Ser 1.75 (*)    Calcium 8.5 (*)    Albumin 2.8 (*)    AST 46 (*)    Total Bilirubin 1.3 (*)    GFR, Estimated 43 (*)    All other components within normal limits  CBC - Abnormal; Notable for the following components:   WBC 30.1 (*)    RBC 3.12 (*)    Hemoglobin 9.7 (*)    HCT 28.4 (*)    RDW 16.3 (*)    Platelets 538 (*)    All other components within normal limits  CBC WITH DIFFERENTIAL/PLATELET - Abnormal; Notable for the following components:   WBC 28.3 (*)    RBC  3.48 (*)    Hemoglobin 10.8 (*)    HCT 31.8 (*)    RDW 16.4 (*)    Platelets 634 (*)    Neutro Abs 23.6 (*)    Monocytes Absolute 1.8 (*)    Abs Immature Granulocytes 0.97 (*)    All other components within normal limits  PROTIME-INR - Abnormal; Notable for the following components:   Prothrombin Time 16.2 (*)    All other components within normal limits  PRO BRAIN NATRIURETIC PEPTIDE - Abnormal; Notable for the following components:   Pro Brain Natriuretic Peptide 416.0 (*)    All other components within normal limits  URINALYSIS, W/ REFLEX TO CULTURE (INFECTION SUSPECTED) - Abnormal; Notable for the following components:   Color, Urine YELLOW (*)    APPearance CLOUDY (*)    Hgb urine dipstick LARGE (*)    Protein, ur 30 (*)    Leukocytes,Ua MODERATE (*)    Bacteria, UA RARE (*)    Non Squamous Epithelial PRESENT (*)    All other components within normal limits  CBC - Abnormal; Notable for the following components:   WBC 30.9 (*)    RBC 3.08 (*)    Hemoglobin 9.4 (*)    HCT 28.2 (*)    RDW 16.3 (*)    Platelets 547 (*)    All other components within normal limits  CREATININE, SERUM - Abnormal; Notable for the following components:   Creatinine, Ser 1.76 (*)    GFR, Estimated 43 (*)    All other components within normal limits  RESP PANEL BY RT-PCR (RSV, FLU A&B, COVID)  RVPGX2  CULTURE, BLOOD (ROUTINE X 2)  CULTURE, BLOOD (ROUTINE X 2)  URINE CULTURE  LACTIC ACID, PLASMA  LACTIC ACID, PLASMA  APTT  URINALYSIS, ROUTINE W REFLEX MICROSCOPIC  PROTIME-INR  CORTISOL-AM, BLOOD  COMPREHENSIVE METABOLIC PANEL WITH GFR  CBC     EKG   RADIOLOGY CT abd and pelvis with iv contrast: Consistent with ongoing bilateral pyelonephritis my independent review interpretation radiologist agrees.  Specifically there is no visible stone possible pneumonia Chest x-ray:  trace pleural edema on my independent review interpretation radiologist agrees    PROCEDURES:  Critical Care  performed: Yes, see critical care procedure note(s)  .Critical Care  Performed by: Nicholaus Rolland BRAVO, MD Authorized by: Nicholaus Rolland BRAVO, MD   Critical care provider statement:    Critical care time (minutes):  30   Critical care was necessary to treat or prevent imminent or life-threatening deterioration of  the following conditions:  Sepsis   Critical care was time spent personally by me on the following activities:  Development of treatment plan with patient or surrogate, discussions with consultants, evaluation of patient's response to treatment, examination of patient, ordering and review of laboratory studies, ordering and review of radiographic studies, ordering and performing treatments and interventions, pulse oximetry, re-evaluation of patient's condition and review of old charts    MEDICATIONS ORDERED IN ED: Medications  lactated ringers  infusion ( Intravenous New Bag/Given 05/01/24 1713)  albuterol  (PROVENTIL ) (2.5 MG/3ML) 0.083% nebulizer solution 2.5 mg (has no administration in time range)  amLODipine  (NORVASC ) tablet 10 mg (has no administration in time range)  tamsulosin  (FLOMAX ) capsule 0.4 mg (0.4 mg Oral Given 05/01/24 2140)  fluticasone  furoate-vilanterol (BREO ELLIPTA ) 200-25 MCG/ACT 1 puff (has no administration in time range)  lactated ringers  infusion (150 mL/hr Intravenous New Bag/Given 05/01/24 2140)  enoxaparin  (LOVENOX ) injection 40 mg (40 mg Subcutaneous Given 05/01/24 2140)  ondansetron  (ZOFRAN ) tablet 4 mg (has no administration in time range)    Or  ondansetron  (ZOFRAN ) injection 4 mg (has no administration in time range)  acetaminophen  (TYLENOL ) tablet 650 mg (has no administration in time range)    Or  acetaminophen  (TYLENOL ) suppository 650 mg (has no administration in time range)  morphine  (PF) 2 MG/ML injection 2 mg (has no administration in time range)  ceFEPIme  (MAXIPIME ) 2 g in sodium chloride  0.9 % 100 mL IVPB (has no administration in time range)   ipratropium-albuterol  (DUONEB) 0.5-2.5 (3) MG/3ML nebulizer solution 3 mL (has no administration in time range)  lactated ringers  bolus 1,000 mL (0 mLs Intravenous Stopped 05/01/24 1644)    And  lactated ringers  bolus 1,000 mL (0 mLs Intravenous Stopped 05/01/24 1711)    And  lactated ringers  bolus 250 mL (0 mLs Intravenous Stopped 05/01/24 1719)  ceFEPIme  (MAXIPIME ) 2 g in sodium chloride  0.9 % 100 mL IVPB (0 g Intravenous Stopped 05/01/24 1720)  metroNIDAZOLE  (FLAGYL ) IVPB 500 mg (0 mg Intravenous Stopped 05/01/24 1807)  ondansetron  (ZOFRAN ) injection 4 mg (4 mg Intravenous Given 05/01/24 1627)  HYDROmorphone  (DILAUDID ) injection 0.5 mg (0.5 mg Intravenous Given 05/01/24 1627)  iohexol  (OMNIPAQUE ) 300 MG/ML solution 100 mL (100 mLs Intravenous Contrast Given 05/01/24 1657)     IMPRESSION / MDM / ASSESSMENT AND PLAN / ED COURSE                                Differential diagnosis includes, but is not limited to, ***    ***   Clinical Course as of 05/01/24 2229  Sun May 01, 2024  1550 WBC(!): 30.1 Consistent with severe sepsis [HD]  1550 Creatinine(!): 1.75 More elevated than previous [HD]  1825 Case discussed with hospitalist for admission [HD]    Clinical Course User Index [HD] Nicholaus Rolland BRAVO, MD   -- Risk: 5 This patient has a high risk of morbidity due to further diagnostic testing or treatment. Rationale: This patients evaluation and management involve a high risk of morbidity due to the potential severity of presenting symptoms, need for diagnostic testing, and/or initiation of treatment that may require close monitoring. The differential includes conditions with potential for significant deterioration or requiring escalation of care. Treatment decisions in the ED, including medication administration, procedural interventions, or disposition planning, reflect this level of risk. COPA: 5 The patient has the following acute or chronic illness/injury that poses a  possible threat to life or  bodily function: [X] : The patient has a potentially serious acute condition or an acute exacerbation of a chronic illness requiring urgent evaluation and management in the Emergency Department. The clinical presentation necessitates immediate consideration of life-threatening or function-threatening diagnoses, even if they are ultimately ruled out.   FINAL CLINICAL IMPRESSION(S) / ED DIAGNOSES   Final diagnoses:  None     Rx / DC Orders   ED Discharge Orders     None        Note:  This document was prepared using Dragon voice recognition software and may include unintentional dictation errors. "

## 2024-05-01 NOTE — H&P (Signed)
 " History and Physical    Patient: Edward Macias FMW:[id number] DOB: 1960-12-29 DOA: 05/01/2024 DOS: the patient was seen and examined on 05/01/2024 PCP: Pcp, No  Patient coming from: Home  Chief Complaint:  Chief Complaint  Patient presents with   Abdominal Pain   HPI: [name] is a 63 y.o. male with medical history significant of osteoarthritis, asthma, COPD, recurrent pneumonia and recurrent UTI who has been admitted twice recently with the same complaint of acute pyelonephritis and treated with antibiotics.  Last discharge was on the 18th.  Patient was also at [facility] where he was seen and treated.  During last hospitalization patient was seen by infectious disease.  Patient discharged on oral antibiotics but came back today with fever chills tachycardia and UTI.  Patient meets sepsis criteria again.  Previously had E. coli with septicemia and bacteremia.  Not sure if it is the same now.  Patient will be admitted for sepsis due to recurrent pyelonephritis.  Review of Systems: As mentioned in the history of present illness. All other systems reviewed and are negative. Past Medical History:  Diagnosis Date   Arthritis    Asthma    AS A CHILD-NO INHALERS   Asthma exacerbation 04/09/2019   COPD (chronic obstructive pulmonary disease) (HCC)    Pneumonia    Sepsis (HCC) 07/24/2018   Past Surgical History:  Procedure Laterality Date   INGUINAL HERNIA REPAIR Left 10/03/2016   Procedure: HERNIA REPAIR INGUINAL ADULT;  Surgeon: Claudene Larinda Bolder, MD;  Location: [facility] ORS;  Service: General;  Laterality: Left;   INSERTION OF MESH Bilateral 03/19/2022   Procedure: INSERTION OF MESH;  Surgeon: Lane Shope, MD;  Location: [facility] ORS;  Service: General;  Laterality: Bilateral;   Social History:  reports that he has been smoking cigarettes. He has a 39 pack-year smoking history. He has never used smokeless tobacco. He reports current alcohol use. He reports that he does not use  drugs.  Allergies[1]  History reviewed. No pertinent family history.  Prior to Admission medications  Medication Sig Start Date End Date Taking? Authorizing Provider  albuterol  (VENTOLIN  HFA) 108 (90 Base) MCG/ACT inhaler Inhale 2 puffs into the lungs every 6 (six) hours as needed for wheezing or shortness of breath. 03/19/24   Kadali, Renuka A, MD  amLODipine  (NORVASC ) 10 MG tablet Take 1 tablet (10 mg total) by mouth daily. 04/28/24 05/28/24  Lanetta Lingo, MD  fluticasone -salmeterol (ADVAIR ) 250-50 MCG/ACT AEPB Inhale 1 puff into the lungs daily. 04/28/24 05/28/24  Agbata, Lingo, MD  ipratropium-albuterol  (DUONEB) 0.5-2.5 (3) MG/3ML SOLN Take 3 mLs by nebulization 3 (three) times daily. 04/28/24 06/07/24  Lanetta Lingo, MD  levofloxacin  (LEVAQUIN ) 750 MG tablet Take 1 tablet (750 mg total) by mouth daily for 12 days. 04/28/24 05/10/24  Lanetta Lingo, MD  tamsulosin  (FLOMAX ) 0.4 MG CAPS capsule Take 1 capsule (0.4 mg total) by mouth daily after supper. 04/28/24 05/28/24  Lanetta Lingo, MD    Physical Exam: Vitals:   05/01/24 1800 05/01/24 1815 05/01/24 1830 05/01/24 1845  BP: 120/69  126/73   Pulse: 97 97 97 92  Resp: 20 (!) 21 (!) 21 14  Temp:      TempSrc:      SpO2: 99% 100% 100% 100%  Weight:      Height:       Constitutional: Acute on chronically ill looking NAD, calm, comfortable Eyes: PERRL, lids and conjunctivae normal ENMT: Mucous membranes are dry. Posterior pharynx clear of any exudate or lesions.Normal dentition.  Neck: normal, supple, no masses, no thyromegaly Respiratory: clear to auscultation bilaterally, no wheezing, no crackles. Normal respiratory effort. No accessory muscle use.  Cardiovascular: Regular rate and rhythm, no murmurs / rubs / gallops. No extremity edema. 2+ pedal pulses. No carotid bruits.  Abdomen: no tenderness, no masses palpated. No hepatosplenomegaly. Bowel sounds positive.  Musculoskeletal: Good range of motion, no joint swelling or  tenderness, Skin: no rashes, lesions, ulcers. No induration Neurologic: CN 2-12 grossly intact. Sensation intact, DTR normal. Strength 5/5 in all 4.  Psychiatric: Normal judgment and insight. Alert and oriented x 3. Normal mood  Data Reviewed:  Temperature 98.9, blood pressure 150/83, pulse 127, respiratory 33, oxygen sat 83% on room air.  White count is 13.9 hemoglobin 9.4 platelets 547, sodium 133 chloride 96 creatinine 1.76 and calcium 8.5.  Acute viral screen is negative for COVID flu, RSV, flu.  Urinalysis showing leukocytosis WBC more than 50.  CT abdomen pelvis, CT abdomen pelvis showed ascending UTI and bilateral pyelonephritis with no abscess there is sigmoid diverticulosis with no bowel obstruction small ascites suspicious for cirrhosis and partially visualized small bowel fluid will effusions with partial compression atelectasis of the lower lobes.  Assessment and Plan:  #1 sepsis due to pyelonephritis: Patient will be readmitted to the hospital.  Urine and blood cultures will be obtained.  Initiate IV antibiotics.  May reconsult infectious disease to reevaluate.  #2 COPD: No acute exacerbation.  Continue to monitor  #3 AKI: Continue to monitor renal function.  Hydrate.  Partly obstructive uropathy.  #4 hyponatremia: Hydrate with saline and monitor.  #5 tobacco abuse: Continues to work cessation counseling  #6 history of asthma: Continue home regimen    Advance Care Planning:   Code Status: Full Code   Consults: Will need infectious disease consult  Family Communication: No family at bedside  Severity of Illness: The appropriate patient status for this patient is INPATIENT. Inpatient status is judged to be reasonable and necessary in order to provide the required intensity of service to ensure the patient's safety. The patient's presenting symptoms, physical exam findings, and initial radiographic and laboratory data in the context of their chronic comorbidities is felt to  place them at high risk for further clinical deterioration. Furthermore, it is not anticipated that the patient will be medically stable for discharge from the hospital within 2 midnights of admission.   * I certify that at the point of admission it is my clinical judgment that the patient will require inpatient hospital care spanning beyond 2 midnights from the point of admission due to high intensity of service, high risk for further deterioration and high frequency of surveillance required.*  AuthorBETHA SIM KNOLL, MD 05/01/2024 6:51 PM  For on call review [email or web address].      [1] No Known Allergies  "

## 2024-05-02 DIAGNOSIS — B9620 Unspecified Escherichia coli [E. coli] as the cause of diseases classified elsewhere: Secondary | ICD-10-CM

## 2024-05-02 DIAGNOSIS — R7881 Bacteremia: Secondary | ICD-10-CM | POA: Diagnosis not present

## 2024-05-02 DIAGNOSIS — A419 Sepsis, unspecified organism: Secondary | ICD-10-CM | POA: Diagnosis not present

## 2024-05-02 DIAGNOSIS — F1090 Alcohol use, unspecified, uncomplicated: Secondary | ICD-10-CM | POA: Insufficient documentation

## 2024-05-02 DIAGNOSIS — R6520 Severe sepsis without septic shock: Secondary | ICD-10-CM | POA: Diagnosis not present

## 2024-05-02 DIAGNOSIS — D72829 Elevated white blood cell count, unspecified: Secondary | ICD-10-CM | POA: Diagnosis not present

## 2024-05-02 LAB — COMPREHENSIVE METABOLIC PANEL WITH GFR
ALT: 28 U/L (ref 0–44)
AST: 31 U/L (ref 15–41)
Albumin: 2.6 g/dL — ABNORMAL LOW (ref 3.5–5.0)
Alkaline Phosphatase: 90 U/L (ref 38–126)
Anion gap: 10 (ref 5–15)
BUN: 21 mg/dL (ref 8–23)
CO2: 25 mmol/L (ref 22–32)
Calcium: 8 mg/dL — ABNORMAL LOW (ref 8.9–10.3)
Chloride: 100 mmol/L (ref 98–111)
Creatinine, Ser: 1.7 mg/dL — ABNORMAL HIGH (ref 0.61–1.24)
GFR, Estimated: 44 mL/min — ABNORMAL LOW
Glucose, Bld: 127 mg/dL — ABNORMAL HIGH (ref 70–99)
Potassium: 4 mmol/L (ref 3.5–5.1)
Sodium: 135 mmol/L (ref 135–145)
Total Bilirubin: 1.2 mg/dL (ref 0.0–1.2)
Total Protein: 6.3 g/dL — ABNORMAL LOW (ref 6.5–8.1)

## 2024-05-02 LAB — CORTISOL-AM, BLOOD: Cortisol - AM: 19.6 ug/dL (ref 6.7–22.6)

## 2024-05-02 LAB — CBC
HCT: 25.6 % — ABNORMAL LOW (ref 39.0–52.0)
Hemoglobin: 8.8 g/dL — ABNORMAL LOW (ref 13.0–17.0)
MCH: 31.2 pg (ref 26.0–34.0)
MCHC: 34.4 g/dL (ref 30.0–36.0)
MCV: 90.8 fL (ref 80.0–100.0)
Platelets: 478 K/uL — ABNORMAL HIGH (ref 150–400)
RBC: 2.82 MIL/uL — ABNORMAL LOW (ref 4.22–5.81)
RDW: 16.2 % — ABNORMAL HIGH (ref 11.5–15.5)
WBC: 26.7 K/uL — ABNORMAL HIGH (ref 4.0–10.5)
nRBC: 0 % (ref 0.0–0.2)

## 2024-05-02 LAB — PROTIME-INR
INR: 1.3 — ABNORMAL HIGH (ref 0.8–1.2)
Prothrombin Time: 17.2 s — ABNORMAL HIGH (ref 11.4–15.2)

## 2024-05-02 LAB — GLUCOSE, CAPILLARY: Glucose-Capillary: 108 mg/dL — ABNORMAL HIGH (ref 70–99)

## 2024-05-02 MED ORDER — LACTULOSE 10 GM/15ML PO SOLN
30.0000 g | Freq: Once | ORAL | Status: AC
  Administered 2024-05-02: 30 g via ORAL
  Filled 2024-05-02: qty 60

## 2024-05-02 MED ORDER — SODIUM CHLORIDE 0.9 % IV BOLUS: 500.0000 mL | Freq: Once | INTRAVENOUS | Status: DC

## 2024-05-02 MED ORDER — CEFAZOLIN SODIUM-DEXTROSE 2-4 GM/100ML-% IV SOLN
2.0000 g | Freq: Three times a day (TID) | INTRAVENOUS | Status: DC
  Administered 2024-05-02: 2 g via INTRAVENOUS
  Filled 2024-05-02: qty 100

## 2024-05-02 MED ORDER — PIPERACILLIN-TAZOBACTAM 3.375 G IVPB
3.3750 g | Freq: Three times a day (TID) | INTRAVENOUS | Status: DC
  Administered 2024-05-02 – 2024-05-05 (×8): 3.375 g via INTRAVENOUS
  Filled 2024-05-02 (×8): qty 50

## 2024-05-02 MED ORDER — CEFAZOLIN SODIUM-DEXTROSE 1-4 GM/50ML-% IV SOLN
1.0000 g | Freq: Three times a day (TID) | INTRAVENOUS | Status: DC
  Filled 2024-05-02: qty 50

## 2024-05-02 MED ORDER — FOLIC ACID 1 MG PO TABS
1.0000 mg | ORAL_TABLET | Freq: Every day | ORAL | Status: DC
  Administered 2024-05-02 – 2024-05-11 (×10): 1 mg via ORAL
  Filled 2024-05-02 (×7): qty 1

## 2024-05-02 MED ORDER — MORPHINE SULFATE (PF) 2 MG/ML IV SOLN
1.0000 mg | INTRAVENOUS | Status: DC | PRN
  Administered 2024-05-02: 2 mg via INTRAVENOUS
  Administered 2024-05-03: 1 mg via INTRAVENOUS
  Administered 2024-05-04: 2 mg via INTRAVENOUS
  Filled 2024-05-02 (×3): qty 1

## 2024-05-02 MED ORDER — THIAMINE MONONITRATE 100 MG PO TABS
100.0000 mg | ORAL_TABLET | Freq: Every day | ORAL | Status: DC
  Administered 2024-05-02 – 2024-05-11 (×10): 100 mg via ORAL
  Filled 2024-05-02 (×8): qty 1
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
## 2024-05-02 NOTE — Consult Note (Signed)
 Infectious Disease     Reason for Consult: pyelonephritis     Referring Physician: Kandis Devaughn Sayres, MD  Date of Admission:  05/01/2024   Principal Problem:   Severe sepsis [facility]) Active Problems:   Peripheral neuropathy   History of asthma   COPD (chronic obstructive pulmonary disease) (HCC)   Tobacco abuse   Pyelonephritis   Hyponatremia   AKI (acute kidney injury)   Alcohol use disorder   HPI: Edward Macias is a 63 y.o. male with a history of hypertension asthma COPD intially admitted 12/13 with back pain fevers and chills and dysuria.  On admission December 13 he was afebrile but spiked temperatures to 101.7.  His white count initially was 33.  On admit his creatinine was 1.94 LFTs were normal..  Urine sample showed 50 white cells. Urine cultures are growing greater than 100,000 E. coli.  Blood cultures 1 set positive for E. coli.  HIV was negative in October He had a CTangio chest abdomen pelvis December 13 that showed aortic arteriosclerosis, hypoenhancement of the kidneys bilaterally compatible with pyelonephritis but no obstruction or stones.  Small pleural effusions on the right and trace on the left and Emphysema I saw him 12.15 for persistent fevers and leukocytosis. He defervesced and was dced on levofloxacin  on 12/18 only to go to [facility] ED and be admitted from 12/19-12/20 with similar clinical picture with repeat ct showing possible microabscesses. Seen by Renal and ID. Dced on levofloxacin  again Now with abd bloating, pain,  and had wbc 30, tachycardiac, UA > 50 whites, CT with bil pyelo, small ascites, small bil pleural effusion Started on cefepime . Main co is abd pain, bloating, decreased po tolerance. No diarrhea. Reports no BM in 5-6 days. Mild cough but min sob Past Medical History:  Diagnosis Date   Arthritis    Asthma    AS A CHILD-NO INHALERS   Asthma exacerbation 04/09/2019   COPD (chronic obstructive pulmonary disease) (HCC)    Pneumonia    Sepsis (HCC) 07/24/2018    Past Surgical History:  Procedure Laterality Date   INGUINAL HERNIA REPAIR Left 10/03/2016   Procedure: HERNIA REPAIR INGUINAL ADULT;  Surgeon: Claudene Larinda Bolder, MD;  Location: [facility] ORS;  Service: General;  Laterality: Left;   INSERTION OF MESH Bilateral 03/19/2022   Procedure: INSERTION OF MESH;  Surgeon: Lane Shope, MD;  Location: [facility] ORS;  Service: General;  Laterality: Bilateral;   Social History[1] History reviewed. No pertinent family history.  Allergies: Allergies[2]  Current antibiotics: Antibiotics Given (last 72 hours)     Date/Time Action Medication Dose Rate   05/01/24 1707 New Bag/Given   metroNIDAZOLE  (FLAGYL ) IVPB 500 mg 500 mg 100 mL/hr   05/01/24 1708 New Bag/Given   ceFEPIme  (MAXIPIME ) 2 g in sodium chloride  0.9 % 100 mL IVPB 2 g 200 mL/hr   05/02/24 0444 New Bag/Given   ceFEPIme  (MAXIPIME ) 2 g in sodium chloride  0.9 % 100 mL IVPB 2 g 200 mL/hr       MEDICATIONS:  amLODipine   10 mg Oral Daily   enoxaparin  (LOVENOX ) injection  40 mg Subcutaneous Q24H   fluticasone  furoate-vilanterol  1 puff Inhalation Daily   folic acid   1 mg Oral Daily   tamsulosin   0.4 mg Oral QPC supper   thiamine   100 mg Oral Daily    Review of Systems - 11 systems reviewed and negative per HPI   OBJECTIVE: Temp:  [97.6 F (36.4 C)-99.6 F (37.6 C)] 97.6 F (36.4 C) (12/22 0824) Pulse  Rate:  [90-127] 96 (12/22 0824) Resp:  [14-33] 17 (12/22 0824) BP: (120-157)/(67-89) 131/69 (12/22 0824) SpO2:  [83 %-100 %] 94 % (12/22 0824) FiO2 (%):  [100 %] 100 % (12/21 1721) Physical Exam  Constitutional: He is oriented to person, place, and time. Chroncially ill appearing  HENT:  Mouth/Throat: Oropharynx is clear and moist. No oropharyngeal exudate.  Cardiovascular: Normal rate, regular rhythm and normal heart sounds. Exam reveals no gallop and no friction rub.  No murmur heard.  Pulmonary/Chest: Effort normal and breath sounds normal. No respiratory distress. He has no  wheezes.  Abdominal: Soft.midl distention, mild diffuse ttp  Lymphadenopathy:  He has no cervical adenopathy.  Neurological: He is alert and oriented to person, place, and time.  Skin: Skin is warm and dry. No rash noted. No erythema.  Psychiatric: He has a normal mood and affect. His behavior is normal.    LABS: Results for orders placed or performed during the hospital encounter of 05/01/24 (from the past 48 hours)  Lipase, blood     Status: Abnormal   Collection Time: 05/01/24 11:40 AM  Result Value Ref Range   Lipase 53 (H) 11 - 51 U/L    Comment: Performed at [facility], [address] [place], [place] [address]  Comprehensive metabolic panel     Status: Abnormal   Collection Time: 05/01/24 11:40 AM  Result Value Ref Range   Sodium 133 (L) 135 - 145 mmol/L   Potassium 4.0 3.5 - 5.1 mmol/L   Chloride 96 (L) 98 - 111 mmol/L   CO2 23 22 - 32 mmol/L   Glucose, Bld 109 (H) 70 - 99 mg/dL    Comment: Glucose reference range applies only to samples taken after fasting for at least 8 hours.   BUN 20 8 - 23 mg/dL   Creatinine, Ser 8.24 (H) 0.61 - 1.24 mg/dL   Calcium 8.5 (L) 8.9 - 10.3 mg/dL   Total Protein 7.0 6.5 - 8.1 g/dL   Albumin 2.8 (L) 3.5 - 5.0 g/dL   AST 46 (H) 15 - 41 U/L   ALT 39 0 - 44 U/L   Alkaline Phosphatase 84 38 - 126 U/L   Total Bilirubin 1.3 (H) 0.0 - 1.2 mg/dL   GFR, Estimated 43 (L) >60 mL/min    Comment: (NOTE) Calculated using the CKD-EPI Creatinine Equation (2021)    Anion gap 15 5 - 15    Comment: Performed at [facility], [address] [place], [place] [address]  CBC     Status: Abnormal   Collection Time: 05/01/24 11:40 AM  Result Value Ref Range   WBC 30.1 (H) 4.0 - 10.5 K/uL   RBC 3.12 (L) 4.22 - 5.81 MIL/uL   Hemoglobin 9.7 (L) 13.0 - 17.0 g/dL   HCT 71.5 (L) 60.9 - 47.9 %   MCV 91.0 80.0 - 100.0 fL   MCH 31.1 26.0 - 34.0 pg   MCHC 34.2 30.0 - 36.0 g/dL   RDW 83.6 (H) 88.4 - 84.4 %   Platelets 538 (H) 150 - 400  K/uL   nRBC 0.0 0.0 - 0.2 %    Comment: Performed at [facility], [address] [place], [place] [address]  Culture, blood (routine x 2)     Status: None (Preliminary result)   Collection Time: 05/01/24 12:00 PM   Specimen: BLOOD  Result Value Ref Range   Specimen Description BLOOD BLOOD RIGHT ARM    Special Requests  BOTTLES DRAWN AEROBIC AND ANAEROBIC Blood Culture adequate volume   Culture      NO GROWTH < 24 HOURS Performed at [facility], [address] [place], [place] [address]    Report Status PENDING   Culture, blood (routine x 2)     Status: None (Preliminary result)   Collection Time: 05/01/24 12:42 PM   Specimen: BLOOD  Result Value Ref Range   Specimen Description BLOOD BLOOD LEFT ARM    Special Requests      BOTTLES DRAWN AEROBIC AND ANAEROBIC Blood Culture adequate volume   Culture      NO GROWTH < 24 HOURS Performed at [facility], [address] [place], [place] [address]    Report Status PENDING   CBC with Differential     Status: Abnormal   Collection Time: 05/01/24  3:48 PM  Result Value Ref Range   WBC 28.3 (H) 4.0 - 10.5 K/uL   RBC 3.48 (L) 4.22 - 5.81 MIL/uL   Hemoglobin 10.8 (L) 13.0 - 17.0 g/dL   HCT 68.1 (L) 60.9 - 47.9 %   MCV 91.4 80.0 - 100.0 fL   MCH 31.0 26.0 - 34.0 pg   MCHC 34.0 30.0 - 36.0 g/dL   RDW 83.5 (H) 88.4 - 84.4 %   Platelets 634 (H) 150 - 400 K/uL   nRBC 0.0 0.0 - 0.2 %   Neutrophils Relative % 84 %   Neutro Abs 23.6 (H) 1.7 - 7.7 K/uL   Lymphocytes Relative 6 %   Lymphs Abs 1.8 0.7 - 4.0 K/uL   Monocytes Relative 7 %   Monocytes Absolute 1.8 (H) 0.1 - 1.0 K/uL   Eosinophils Relative 0 %   Eosinophils Absolute 0.0 0.0 - 0.5 K/uL   Basophils Relative 0 %   Basophils Absolute 0.1 0.0 - 0.1 K/uL   WBC Morphology MORPHOLOGY UNREMARKABLE    RBC Morphology MORPHOLOGY UNREMARKABLE    Smear Review Normal platelet morphology    Immature Granulocytes 3 %   Abs Immature Granulocytes 0.97  (H) 0.00 - 0.07 K/uL    Comment: Performed at [facility], [address] [place], [place] [address]  Protime-INR     Status: Abnormal   Collection Time: 05/01/24  3:48 PM  Result Value Ref Range   Prothrombin Time 16.2 (H) 11.4 - 15.2 seconds   INR 1.2 0.8 - 1.2    Comment: (NOTE) INR goal varies based on device and disease states. Performed at [facility], [address] [place], [place] [address]   Pro Brain natriuretic peptide     Status: Abnormal   Collection Time: 05/01/24  3:48 PM  Result Value Ref Range   Pro Brain Natriuretic Peptide 416.0 (H) <300.0 pg/mL    Comment: (NOTE) Age Group        Cut-Points    Interpretation  < 50 years     450 pg/mL       NT-proBNP > 450 pg/mL indicates                                ADHF is likely              50 to 75 years  900 pg/mL      NT-proBNP > 900 pg/mL indicates          ADHF is likely  > 75 years      1800 pg/mL  NT-proBNP > 1800 pg/mL indicates          ADHF is likely                           All ages    Results between       Indeterminate. Further clinical             300 and the cut-   information is needed to determine            point for age group   if ADHF is present.                                                             Elecsys proBNP II/ Elecsys proBNP II STAT           Cut-Point                       Interpretation  300 pg/mL                    NT-proBNP <300pg/mL indicates                             ADHF is not likely  Performed at [facility], [address] [place], [place] [address]   APTT     Status: None   Collection Time: 05/01/24  3:48 PM  Result Value Ref Range   aPTT 35 24 - 36 seconds    Comment: Performed at [facility], [address] [place], [place] [address]  Lactic acid, plasma     Status: None   Collection Time: 05/01/24  4:15 PM  Result Value Ref Range   Lactic Acid, Venous 1.1 0.5 - 1.9 mmol/L    Comment: Performed at [facility], [address] [place], [place] [address]  Urinalysis, w/ Reflex to Culture (Infection Suspected) -Urine, Clean Catch     Status: Abnormal   Collection Time: 05/01/24  4:15 PM  Result Value Ref Range   Specimen Source URINE, CLEAN CATCH    Color, Urine YELLOW (A) YELLOW   APPearance CLOUDY (A) CLEAR   Specific Gravity, Urine 1.008 1.005 - 1.030   pH 6.0 5.0 - 8.0   Glucose, UA NEGATIVE NEGATIVE mg/dL   Hgb urine dipstick LARGE (A) NEGATIVE   Bilirubin Urine NEGATIVE NEGATIVE   Ketones, ur NEGATIVE NEGATIVE mg/dL   Protein, ur 30 (A) NEGATIVE mg/dL   Nitrite NEGATIVE NEGATIVE   Leukocytes,Ua MODERATE (A) NEGATIVE   RBC / HPF 21-50 0 - 5 RBC/hpf   WBC, UA >50 0 - 5 WBC/hpf    Comment:        Reflex urine culture not performed if WBC <=10, OR if Squamous epithelial cells >5. If Squamous epithelial cells >5 suggest recollection.    Bacteria, UA RARE (A) NONE SEEN   Squamous Epithelial / HPF 0-5 0 - 5 /HPF   WBC Clumps PRESENT    Mucus PRESENT    Granular Casts, UA PRESENT    Non Squamous Epithelial PRESENT (A) NONE SEEN    Comment: Performed at [facility], [address], [place], [place] [address]  Resp  panel by RT-PCR (RSV, Flu A&B, Covid) Anterior Nasal Swab     Status: None   Collection Time: 05/01/24  5:30 PM   Specimen: Anterior Nasal Swab  Result Value Ref Range   SARS Coronavirus 2 by RT PCR NEGATIVE NEGATIVE    Comment: (NOTE) SARS-CoV-2 target nucleic acids are NOT DETECTED.  The SARS-CoV-2 RNA is generally detectable in upper respiratory specimens during the acute phase of infection. The lowest concentration of SARS-CoV-2 viral copies this assay can detect is 138 copies/mL. A negative result does not preclude SARS-Cov-2 infection and should not be used as the sole basis for treatment or other patient management decisions. A negative result may occur with  improper specimen collection/handling, submission of specimen other than  nasopharyngeal swab, presence of viral mutation(s) within the areas targeted by this assay, and inadequate number of viral copies(<138 copies/mL). A negative result must be combined with clinical observations, patient history, and epidemiological information. The expected result is Negative.  Fact Sheet for Patients:  bloggercourse.com  Fact Sheet for Healthcare Providers:  seriousbroker.it  This test is no t yet approved or cleared by the [place]  FDA and  has been authorized for detection and/or diagnosis of SARS-CoV-2 by FDA under an Emergency Use Authorization (EUA). This EUA will remain  in effect (meaning this test can be used) for the duration of the COVID-19 declaration under Section 564(b)(1) of the Act, 21 U.S.C.section 360bbb-3(b)(1), unless the authorization is terminated  or revoked sooner.       Influenza A by PCR NEGATIVE NEGATIVE   Influenza B by PCR NEGATIVE NEGATIVE    Comment: (NOTE) The Xpert Xpress SARS-CoV-2/FLU/RSV plus assay is intended as an aid in the diagnosis of influenza from Nasopharyngeal swab specimens and should not be used as a sole basis for treatment. Nasal washings and aspirates are unacceptable for Xpert Xpress SARS-CoV-2/FLU/RSV testing.  Fact Sheet for Patients: bloggercourse.com  Fact Sheet for Healthcare Providers: seriousbroker.it  This test is not yet approved or cleared by the [place]  FDA and has been authorized for detection and/or diagnosis of SARS-CoV-2 by FDA under an Emergency Use Authorization (EUA). This EUA will remain in effect (meaning this test can be used) for the duration of the COVID-19 declaration under Section 564(b)(1) of the Act, 21 U.S.C. section 360bbb-3(b)(1), unless the authorization is terminated or revoked.     Resp Syncytial Virus by PCR NEGATIVE NEGATIVE    Comment: (NOTE) Fact Sheet for  Patients: bloggercourse.com  Fact Sheet for Healthcare Providers: seriousbroker.it  This test is not yet approved or cleared by the [place]  FDA and has been authorized for detection and/or diagnosis of SARS-CoV-2 by FDA under an Emergency Use Authorization (EUA). This EUA will remain in effect (meaning this test can be used) for the duration of the COVID-19 declaration under Section 564(b)(1) of the Act, 21 U.S.C. section 360bbb-3(b)(1), unless the authorization is terminated or revoked.  Performed at [facility], [address] [place], [place] [address]   Lactic acid, plasma     Status: None   Collection Time: 05/01/24  8:07 PM  Result Value Ref Range   Lactic Acid, Venous 1.9 0.5 - 1.9 mmol/L    Comment: Performed at [facility], [address] [place], [place] [address]  CBC     Status: Abnormal   Collection Time: 05/01/24  8:07 PM  Result Value Ref Range   WBC 30.9 (H) 4.0 - 10.5 K/uL   RBC 3.08 (L) 4.22 - 5.81  MIL/uL   Hemoglobin 9.4 (L) 13.0 - 17.0 g/dL   HCT 71.7 (L) 60.9 - 47.9 %   MCV 91.6 80.0 - 100.0 fL   MCH 30.5 26.0 - 34.0 pg   MCHC 33.3 30.0 - 36.0 g/dL   RDW 83.6 (H) 88.4 - 84.4 %   Platelets 547 (H) 150 - 400 K/uL   nRBC 0.0 0.0 - 0.2 %    Comment: Performed at [facility], [address] [place], [place] [address]  Creatinine, serum     Status: Abnormal   Collection Time: 05/01/24  8:07 PM  Result Value Ref Range   Creatinine, Ser 1.76 (H) 0.61 - 1.24 mg/dL   GFR, Estimated 43 (L) >60 mL/min    Comment: (NOTE) Calculated using the CKD-EPI Creatinine Equation (2021) Performed at [facility], [address] [place], [place] [address]   Protime-INR     Status: Abnormal   Collection Time: 05/02/24  5:46 AM  Result Value Ref Range   Prothrombin Time 17.2 (H) 11.4 - 15.2 seconds   INR 1.3 (H) 0.8 - 1.2    Comment: (NOTE) INR goal varies based on  device and disease states. Performed at [facility], [address] [place], [place] [address]   Cortisol-am, blood     Status: None   Collection Time: 05/02/24  5:46 AM  Result Value Ref Range   Cortisol - AM 19.6 6.7 - 22.6 ug/dL    Comment: Performed at [facility], [address] [place], [place] [address]  Comprehensive metabolic panel     Status: Abnormal   Collection Time: 05/02/24  5:46 AM  Result Value Ref Range   Sodium 135 135 - 145 mmol/L   Potassium 4.0 3.5 - 5.1 mmol/L   Chloride 100 98 - 111 mmol/L   CO2 25 22 - 32 mmol/L   Glucose, Bld 127 (H) 70 - 99 mg/dL    Comment: Glucose reference range applies only to samples taken after fasting for at least 8 hours.   BUN 21 8 - 23 mg/dL   Creatinine, Ser 8.29 (H) 0.61 - 1.24 mg/dL   Calcium 8.0 (L) 8.9 - 10.3 mg/dL   Total Protein 6.3 (L) 6.5 - 8.1 g/dL   Albumin 2.6 (L) 3.5 - 5.0 g/dL   AST 31 15 - 41 U/L   ALT 28 0 - 44 U/L   Alkaline Phosphatase 90 38 - 126 U/L   Total Bilirubin 1.2 0.0 - 1.2 mg/dL   GFR, Estimated 44 (L) >60 mL/min    Comment: (NOTE) Calculated using the CKD-EPI Creatinine Equation (2021)    Anion gap 10 5 - 15    Comment: Performed at [facility], [address] [place], [place] [address]  CBC     Status: Abnormal   Collection Time: 05/02/24  5:46 AM  Result Value Ref Range   WBC 26.7 (H) 4.0 - 10.5 K/uL   RBC 2.82 (L) 4.22 - 5.81 MIL/uL   Hemoglobin 8.8 (L) 13.0 - 17.0 g/dL   HCT 74.3 (L) 60.9 - 47.9 %   MCV 90.8 80.0 - 100.0 fL   MCH 31.2 26.0 - 34.0 pg   MCHC 34.4 30.0 - 36.0 g/dL   RDW 83.7 (H) 88.4 - 84.4 %   Platelets 478 (H) 150 - 400 K/uL   nRBC 0.0 0.0 - 0.2 %    Comment: Performed at [facility], [address], [place], [place] [address]  No components found for: ESR, C REACTIVE PROTEIN MICRO: Recent Results (from the past 720 hours)  Blood culture (routine x 2)     Status: Abnormal   Collection Time: 04/23/24  3:02 PM    Specimen: BLOOD  Result Value Ref Range Status   Specimen Description   Final    BLOOD RIGHT ANTECUBITAL Performed at [facility], [address], [place], [place] [address]    Special Requests   Final    BOTTLES DRAWN AEROBIC AND ANAEROBIC Blood Culture adequate volume Performed at [facility], [address] [place], [place] [address]    Culture  Setup Time   Final    GRAM NEGATIVE RODS AEROBIC BOTTLE ONLY CRITICAL RESULT CALLED TO, READ BACK BY AND VERIFIED WITH: NATHAN B., PHARM D AT 9371 04/24/24 RAM GRAM STAIN REVIEWED-AGREE WITH RESULT Performed at [facility], [address] [place], [place] [address]    Culture ESCHERICHIA COLI (A)  Final   Report Status 04/26/2024 FINAL  Final   Organism ID, Bacteria ESCHERICHIA COLI  Final      Susceptibility   Escherichia coli - MIC*    AMPICILLIN 8 SENSITIVE Sensitive     CEFAZOLIN  (NON-URINE) 2 SENSITIVE Sensitive     CEFEPIME  <=0.12 SENSITIVE Sensitive     ERTAPENEM <=0.12 SENSITIVE Sensitive     CEFTRIAXONE  <=0.25 SENSITIVE Sensitive     CIPROFLOXACIN <=0.06 SENSITIVE Sensitive     GENTAMICIN <=1 SENSITIVE Sensitive     MEROPENEM <=0.25 SENSITIVE Sensitive     TRIMETH /SULFA  <=20 SENSITIVE Sensitive     AMPICILLIN/SULBACTAM <=2 SENSITIVE Sensitive     PIP/TAZO Value in next row Sensitive      <=4 SENSITIVEThis is a modified FDA-approved test that has been validated and its performance characteristics determined by the reporting laboratory.  This laboratory is certified under the Clinical Laboratory Improvement Amendments CLIA as qualified to perform high complexity clinical laboratory testing.    * ESCHERICHIA COLI  Blood Culture ID Panel (Reflexed)     Status: Abnormal   Collection Time: 04/23/24  3:02 PM  Result Value Ref Range Status   Enterococcus faecalis NOT DETECTED NOT DETECTED Final   Enterococcus Faecium NOT DETECTED NOT DETECTED Final   Listeria monocytogenes NOT DETECTED NOT DETECTED  Final   Staphylococcus species NOT DETECTED NOT DETECTED Final   Staphylococcus aureus (BCID) NOT DETECTED NOT DETECTED Final   Staphylococcus epidermidis NOT DETECTED NOT DETECTED Final   Staphylococcus lugdunensis NOT DETECTED NOT DETECTED Final   Streptococcus species NOT DETECTED NOT DETECTED Final   Streptococcus agalactiae NOT DETECTED NOT DETECTED Final   Streptococcus pneumoniae NOT DETECTED NOT DETECTED Final   Streptococcus pyogenes NOT DETECTED NOT DETECTED Final   A.calcoaceticus-baumannii NOT DETECTED NOT DETECTED Final   Bacteroides fragilis NOT DETECTED NOT DETECTED Final   Enterobacterales DETECTED (A) NOT DETECTED Final    Comment: Enterobacterales represent a large order of gram negative bacteria, not a single organism. CRITICAL RESULT CALLED TO, READ BACK BY AND VERIFIED WITH: RANKIN WENDI GOWER D AT 9371 04/24/24 RAM    Enterobacter cloacae complex NOT DETECTED NOT DETECTED Final   Escherichia coli DETECTED (A) NOT DETECTED Final    Comment: CRITICAL RESULT CALLED TO, READ BACK BY AND VERIFIED WITH: RANKIN WENDI GOWER D AT 9371 04/24/24 RAM    Klebsiella aerogenes NOT DETECTED NOT DETECTED Final   Klebsiella oxytoca NOT DETECTED NOT DETECTED Final   Klebsiella pneumoniae NOT DETECTED NOT DETECTED Final   Proteus species  NOT DETECTED NOT DETECTED Final   Salmonella species NOT DETECTED NOT DETECTED Final   Serratia marcescens NOT DETECTED NOT DETECTED Final   Haemophilus influenzae NOT DETECTED NOT DETECTED Final   Neisseria meningitidis NOT DETECTED NOT DETECTED Final   Pseudomonas aeruginosa NOT DETECTED NOT DETECTED Final   Stenotrophomonas maltophilia NOT DETECTED NOT DETECTED Final   Candida albicans NOT DETECTED NOT DETECTED Final   Candida auris NOT DETECTED NOT DETECTED Final   Candida glabrata NOT DETECTED NOT DETECTED Final   Candida krusei NOT DETECTED NOT DETECTED Final   Candida parapsilosis NOT DETECTED NOT DETECTED Final   Candida tropicalis NOT  DETECTED NOT DETECTED Final   Cryptococcus neoformans/gattii NOT DETECTED NOT DETECTED Final   CTX-M ESBL NOT DETECTED NOT DETECTED Final   Carbapenem resistance IMP NOT DETECTED NOT DETECTED Final   Carbapenem resistance KPC NOT DETECTED NOT DETECTED Final   Carbapenem resistance NDM NOT DETECTED NOT DETECTED Final   Carbapenem resist OXA 48 LIKE NOT DETECTED NOT DETECTED Final   Carbapenem resistance VIM NOT DETECTED NOT DETECTED Final    Comment: Performed at [facility], [address] [place], [place] [address]  Blood culture (routine x 2)     Status: Abnormal   Collection Time: 04/23/24  3:07 PM   Specimen: BLOOD LEFT HAND  Result Value Ref Range Status   Specimen Description   Final    BLOOD LEFT HAND Performed at [facility], [address], [place], [place] [address]    Special Requests   Final    BOTTLES DRAWN AEROBIC AND ANAEROBIC Blood Culture adequate volume Performed at [facility], [address] [place], [place] [address]    Culture  Setup Time   Final    GRAM NEGATIVE RODS AEROBIC BOTTLE ONLY CRITICAL VALUE NOTED.  VALUE IS CONSISTENT WITH PREVIOUSLY REPORTED AND CALLED VALUE. Performed at [facility], [address] [place], [place] [address]    Culture (A)  Final    ESCHERICHIA COLI SUSCEPTIBILITIES PERFORMED ON PREVIOUS CULTURE WITHIN THE LAST 5 DAYS. Performed at [facility], [address], [place], [place] [address]    Report Status 04/29/2024 FINAL  Final  Urine Culture (for pregnant, neutropenic or urologic patients or patients with an indwelling urinary catheter)     Status: Abnormal   Collection Time: 04/23/24  3:19 PM   Specimen: Urine, Random  Result Value Ref Range Status   Specimen Description   Final    URINE, RANDOM Performed at [facility], [address], [place], [place] [address]    Special Requests   Final    NONE Performed at [facility], [address]  [place], [place] [address]    Culture >=100,000 COLONIES/mL ESCHERICHIA COLI (A)  Final   Report Status 04/26/2024 FINAL  Final   Organism ID, Bacteria ESCHERICHIA COLI (A)  Final      Susceptibility   Escherichia coli - MIC*    AMPICILLIN 8 SENSITIVE Sensitive     CEFAZOLIN  (URINE) Value in next row Sensitive      2 SENSITIVEThis is a modified FDA-approved test that has been validated and its performance characteristics determined by the reporting laboratory.  This laboratory is certified under the Clinical Laboratory Improvement Amendments CLIA as qualified to perform high complexity clinical laboratory testing.    CEFEPIME  Value in next row Sensitive      2 SENSITIVEThis is a modified FDA-approved test that has been validated and its  performance characteristics determined by the reporting laboratory.  This laboratory is certified under the Clinical Laboratory Improvement Amendments CLIA as qualified to perform high complexity clinical laboratory testing.    ERTAPENEM Value in next row Sensitive      2 SENSITIVEThis is a modified FDA-approved test that has been validated and its performance characteristics determined by the reporting laboratory.  This laboratory is certified under the Clinical Laboratory Improvement Amendments CLIA as qualified to perform high complexity clinical laboratory testing.    CEFTRIAXONE  Value in next row Sensitive      2 SENSITIVEThis is a modified FDA-approved test that has been validated and its performance characteristics determined by the reporting laboratory.  This laboratory is certified under the Clinical Laboratory Improvement Amendments CLIA as qualified to perform high complexity clinical laboratory testing.    CIPROFLOXACIN Value in next row Sensitive      2 SENSITIVEThis is a modified FDA-approved test that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

## 2024-05-03 DIAGNOSIS — R6520 Severe sepsis without septic shock: Secondary | ICD-10-CM | POA: Diagnosis not present

## 2024-05-03 DIAGNOSIS — B9620 Unspecified Escherichia coli [E. coli] as the cause of diseases classified elsewhere: Secondary | ICD-10-CM | POA: Diagnosis not present

## 2024-05-03 DIAGNOSIS — A419 Sepsis, unspecified organism: Secondary | ICD-10-CM | POA: Diagnosis not present

## 2024-05-03 DIAGNOSIS — R7881 Bacteremia: Secondary | ICD-10-CM | POA: Diagnosis not present

## 2024-05-03 DIAGNOSIS — D72829 Elevated white blood cell count, unspecified: Secondary | ICD-10-CM | POA: Diagnosis not present

## 2024-05-03 LAB — URINE CULTURE: Culture: NO GROWTH

## 2024-05-03 LAB — BASIC METABOLIC PANEL WITH GFR
Anion gap: 13 (ref 5–15)
BUN: 25 mg/dL — ABNORMAL HIGH (ref 8–23)
CO2: 24 mmol/L (ref 22–32)
Calcium: 7.9 mg/dL — ABNORMAL LOW (ref 8.9–10.3)
Chloride: 97 mmol/L — ABNORMAL LOW (ref 98–111)
Creatinine, Ser: 2.24 mg/dL — ABNORMAL HIGH (ref 0.61–1.24)
GFR, Estimated: 32 mL/min — ABNORMAL LOW
Glucose, Bld: 100 mg/dL — ABNORMAL HIGH (ref 70–99)
Potassium: 3.7 mmol/L (ref 3.5–5.1)
Sodium: 134 mmol/L — ABNORMAL LOW (ref 135–145)

## 2024-05-03 LAB — CBC
HCT: 27.7 % — ABNORMAL LOW (ref 39.0–52.0)
Hemoglobin: 9.2 g/dL — ABNORMAL LOW (ref 13.0–17.0)
MCH: 30.4 pg (ref 26.0–34.0)
MCHC: 33.2 g/dL (ref 30.0–36.0)
MCV: 91.4 fL (ref 80.0–100.0)
Platelets: 445 K/uL — ABNORMAL HIGH (ref 150–400)
RBC: 3.03 MIL/uL — ABNORMAL LOW (ref 4.22–5.81)
RDW: 16.4 % — ABNORMAL HIGH (ref 11.5–15.5)
WBC: 24.4 K/uL — ABNORMAL HIGH (ref 4.0–10.5)
nRBC: 0 % (ref 0.0–0.2)

## 2024-05-03 MED ORDER — SODIUM CHLORIDE 0.9 % IV SOLN: INTRAVENOUS | Status: AC

## 2024-05-03 MED ORDER — LORATADINE 10 MG PO TABS
10.0000 mg | ORAL_TABLET | Freq: Every day | ORAL | Status: DC
  Administered 2024-05-03 – 2024-05-11 (×9): 10 mg via ORAL
  Filled 2024-05-03 (×6): qty 1
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
## 2024-05-03 NOTE — Progress Notes (Signed)
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 TISSUES: Retained metallic BBs in chest. No acute osseous abnormality. IMPRESSION: 1. Trace pleural effusions with bibasilar atelectasis. Electronically signed by: Rogelia Myers MD 05/01/2024 04:47 PM EST RP Workstation: [id number]        Scheduled Meds:  amLODipine   10 mg Oral Daily   enoxaparin  (LOVENOX )  injection  40 mg Subcutaneous Q24H   fluticasone  furoate-vilanterol  1 puff Inhalation Daily    folic acid   1 mg Oral Daily   loratadine   10 mg Oral Daily   tamsulosin   0.4 mg Oral QPC supper   thiamine   100 mg Oral Daily   Continuous Infusions:  sodium chloride  125 mL/hr at 05/03/24 1040   piperacillin -tazobactam (ZOSYN )  IV 3.375 g (05/03/24 1314)     LOS: 2 days     Devaughn KATHEE Ban, MD [facility]   If 7PM-7AM, please contact night-coverage [email or web address] Password [email or web address] 05/03/2024, 2:17 PM     "

## 2024-05-03 NOTE — TOC Initial Note (Signed)
 Transition of Care [facility]) - Initial/Assessment Note    Patient Details  Name: Edward Macias MRN: [id number] Date of Birth: 07-Feb-1961  [facility] CM/SW Contact:    Alfonso Rummer, LCSW Phone Number: 05/03/2024, 3:55 PM  Clinical Narrative:                    TOC completed initial and readmit screening. Pt reports he lives with disable girlfriend. Pt son provides transportation and uses psychologist, forensic. Pt reports he will return home when medically ready and son will pick up patient. No reports he does not have medical dme at home however he is currently on 2L of oxygen as inpatient. Pt denies any current toc needs.      Patient Goals and CMS Choice            Expected Discharge Plan and Services                                              Prior Living Arrangements/Services                       Activities of Daily Living   ADL Screening (condition at time of admission) Independently performs ADLs?: Yes (appropriate for developmental age) Is the patient deaf or have difficulty hearing?: No Does the patient have difficulty seeing, even when wearing glasses/contacts?: No Does the patient have difficulty concentrating, remembering, or making decisions?: No  Permission Sought/Granted                  Emotional Assessment              Admission diagnosis:  UTI (urinary tract infection) [N39.0] Patient Active Problem List   Diagnosis Date Noted   Alcohol use disorder 05/02/2024   UTI (urinary tract infection) 05/01/2024   Renal abscess, right 04/30/2024   Hypomagnesemia 04/26/2024   Hyponatremia 04/24/2024   Hypokalemia 04/24/2024   AKI (acute kidney injury) 04/24/2024   E. coli septicemia (HCC) 04/24/2024   Acute pyelonephritis 04/23/2024   Pyelonephritis 04/23/2024   COPD with acute exacerbation (HCC) 03/18/2024   COPD exacerbation (HCC) 03/03/2024   Right inguinal hernia 03/20/2022   Recurrent left inguinal hernia  03/06/2022   COPD (chronic obstructive pulmonary disease) (HCC) 04/17/2020   Tobacco abuse 04/17/2020   History of prediabetes 04/17/2020   Elevated lipids 12/13/2019   Encounter to establish care 11/24/2019   History of asthma 11/24/2019   Chronic right hip pain 11/24/2019   Peripheral neuropathy 04/10/2019   Wheezing 04/09/2019   Severe sepsis (HCC) 07/24/2018   PCP:  Freddrick No Pharmacy:   [facility] [address] ([address]), [place] - [address] [place] ([place]) [place] [address] Phone: [phone] Fax: [phone]  [facility] [address], [place] - [address] [place] [address] Phone: [phone] Fax: [phone]  [facility] [address] [place] [address] Phone: [phone] Fax: [phone]     Social Drivers of Health (SDOH) Social History: SDOH Screenings   Food Insecurity: No Food Insecurity (05/01/2024)  Housing: Low Risk (05/01/2024)  Transportation Needs: No Transportation Needs (05/01/2024)  Utilities: Not At Risk (05/01/2024)  Social Connections: Socially Isolated (04/23/2024)  Tobacco Use: High Risk (05/01/2024)   SDOH Interventions:     Readmission Risk  Interventions    05/03/2024    3:54 PM  Readmission Risk Prevention Plan  Transportation Screening Complete  PCP or Specialist Appt within 3-5 Days Complete  HRI or Home Care Consult Not Complete  HRI or Home Care Consult comments none  Social Work Consult for Recovery Care Planning/Counseling Not Complete  SW consult not completed comments n/a  Palliative Care Screening Not Applicable  Medication Review Oceanographer) Complete

## 2024-05-03 NOTE — Progress Notes (Signed)
" °   05/02/24 2013  Assess: MEWS Score  Temp (!) 101.2 F (38.4 C)  BP 100/71  MAP (mmHg) 79  Pulse Rate 100  Resp 18  SpO2 91 %  O2 Device Room Air  Assess: MEWS Score  MEWS Temp 1  MEWS Systolic 1  MEWS Pulse 0  MEWS RR 0  MEWS LOC 0  MEWS Score 2  MEWS Score Color Yellow  Assess: if the MEWS score is Yellow or Red  Were vital signs accurate and taken at a resting state? Yes  Does the patient meet 2 or more of the SIRS criteria? Yes  Does the patient have a confirmed or suspected source of infection? Yes  MEWS guidelines implemented  Yes, yellow  Treat  MEWS Interventions Considered administering scheduled or prn medications/treatments as ordered  Take Vital Signs  Increase Vital Sign Frequency  Yellow: Q2hr x1, continue Q4hrs until patient remains green for 12hrs  Escalate  MEWS: Escalate Yellow: Discuss with charge nurse and consider notifying provider and/or RRT  Notify: Charge Nurse/RN  Name of Charge Nurse/RN Notified Administrator  Provider Notification  Provider Name/Title Laneta Gardener NP  Date Provider Notified 05/02/24  Time Provider Notified 2230  Method of Notification Page  Notification Reason Other (Comment) (low bp and temp\)  Provider response No new orders  Date of Provider Response 05/02/24  Time of Provider Response 2250  Notify: Rapid Response  Name of Rapid Response RN Notified not needed  Date Rapid Response Notified 05/02/24  Time Rapid Response Notified 2230  Assess: SIRS CRITERIA  SIRS Temperature  1  SIRS Respirations  0  SIRS Pulse 1  SIRS WBC 0  SIRS Score Sum  2    "

## 2024-05-03 NOTE — Discharge Instructions (Signed)
 Some PCP options in [place] area- not a comprehensive list  [facility]- [phone] [facility]- [phone] [facility]- [phone] [facility]- [phone] [facility]- [phone] Nichole Molly- [phone]  or [facility] Physician Referral Line [phone]

## 2024-05-03 NOTE — Plan of Care (Signed)
  Problem: Fluid Volume: Goal: Hemodynamic stability will improve Outcome: Progressing   Problem: Clinical Measurements: Goal: Diagnostic test results will improve Outcome: Progressing Goal: Signs and symptoms of infection will decrease Outcome: Progressing   Problem: Respiratory: Goal: Ability to maintain adequate ventilation will improve Outcome: Progressing   Problem: Education: Goal: Knowledge of General Education information will improve Description: Including pain rating scale, medication(s)/side effects and non-pharmacologic comfort measures Outcome: Progressing   Problem: Health Behavior/Discharge Planning: Goal: Ability to manage health-related needs will improve Outcome: Progressing   Problem: Clinical Measurements: Goal: Ability to maintain clinical measurements within normal limits will improve Outcome: Progressing Goal: Will remain free from infection Outcome: Progressing Goal: Diagnostic test results will improve Outcome: Progressing Goal: Respiratory complications will improve Outcome: Progressing Goal: Cardiovascular complication will be avoided Outcome: Progressing   Problem: Activity: Goal: Risk for activity intolerance will decrease Outcome: Progressing   Problem: Nutrition: Goal: Adequate nutrition will be maintained Outcome: Progressing   Problem: Coping: Goal: Level of anxiety will decrease Outcome: Progressing   Problem: Elimination: Goal: Will not experience complications related to bowel motility Outcome: Progressing Goal: Will not experience complications related to urinary retention Outcome: Progressing   Problem: Pain Managment: Goal: General experience of comfort will improve and/or be controlled Outcome: Progressing   Problem: Safety: Goal: Ability to remain free from injury will improve Outcome: Progressing   Problem: Skin Integrity: Goal: Risk for impaired skin integrity will decrease Outcome: Progressing   Problem:  Education: Goal: Knowledge of General Education information will improve Description: Including pain rating scale, medication(s)/side effects and non-pharmacologic comfort measures Outcome: Progressing   Problem: Health Behavior/Discharge Planning: Goal: Ability to manage health-related needs will improve Outcome: Progressing   Problem: Clinical Measurements: Goal: Ability to maintain clinical measurements within normal limits will improve Outcome: Progressing Goal: Will remain free from infection Outcome: Progressing Goal: Diagnostic test results will improve Outcome: Progressing Goal: Respiratory complications will improve Outcome: Progressing Goal: Cardiovascular complication will be avoided Outcome: Progressing   Problem: Activity: Goal: Risk for activity intolerance will decrease Outcome: Progressing   Problem: Nutrition: Goal: Adequate nutrition will be maintained Outcome: Progressing   Problem: Coping: Goal: Level of anxiety will decrease Outcome: Progressing   Problem: Elimination: Goal: Will not experience complications related to bowel motility Outcome: Progressing Goal: Will not experience complications related to urinary retention Outcome: Progressing   Problem: Pain Managment: Goal: General experience of comfort will improve and/or be controlled Outcome: Progressing   Problem: Safety: Goal: Ability to remain free from injury will improve Outcome: Progressing   Problem: Skin Integrity: Goal: Risk for impaired skin integrity will decrease Outcome: Progressing

## 2024-05-04 DIAGNOSIS — R6520 Severe sepsis without septic shock: Secondary | ICD-10-CM | POA: Diagnosis not present

## 2024-05-04 DIAGNOSIS — A419 Sepsis, unspecified organism: Secondary | ICD-10-CM | POA: Diagnosis not present

## 2024-05-04 LAB — CBC
HCT: 24.1 % — ABNORMAL LOW (ref 39.0–52.0)
Hemoglobin: 8.3 g/dL — ABNORMAL LOW (ref 13.0–17.0)
MCH: 30.9 pg (ref 26.0–34.0)
MCHC: 34.4 g/dL (ref 30.0–36.0)
MCV: 89.6 fL (ref 80.0–100.0)
Platelets: 393 K/uL (ref 150–400)
RBC: 2.69 MIL/uL — ABNORMAL LOW (ref 4.22–5.81)
RDW: 16.3 % — ABNORMAL HIGH (ref 11.5–15.5)
WBC: 18.7 K/uL — ABNORMAL HIGH (ref 4.0–10.5)
nRBC: 0 % (ref 0.0–0.2)

## 2024-05-04 LAB — BASIC METABOLIC PANEL WITH GFR
Anion gap: 9 (ref 5–15)
BUN: 32 mg/dL — ABNORMAL HIGH (ref 8–23)
CO2: 25 mmol/L (ref 22–32)
Calcium: 7.4 mg/dL — ABNORMAL LOW (ref 8.9–10.3)
Chloride: 98 mmol/L (ref 98–111)
Creatinine, Ser: 3.02 mg/dL — ABNORMAL HIGH (ref 0.61–1.24)
GFR, Estimated: 22 mL/min — ABNORMAL LOW
Glucose, Bld: 109 mg/dL — ABNORMAL HIGH (ref 70–99)
Potassium: 3.8 mmol/L (ref 3.5–5.1)
Sodium: 132 mmol/L — ABNORMAL LOW (ref 135–145)

## 2024-05-04 MED ORDER — ENOXAPARIN SODIUM 30 MG/0.3ML IJ SOSY
30.0000 mg | PREFILLED_SYRINGE | INTRAMUSCULAR | Status: DC
  Filled 2024-05-04: qty 0.3

## 2024-05-04 MED ORDER — LACTULOSE 10 GM/15ML PO SOLN
30.0000 g | Freq: Every day | ORAL | Status: DC | PRN
  Administered 2024-05-04: 30 g via ORAL
  Filled 2024-05-04: qty 60

## 2024-05-04 MED ORDER — OXYCODONE HCL 5 MG PO TABS
5.0000 mg | ORAL_TABLET | Freq: Four times a day (QID) | ORAL | Status: DC | PRN
  Administered 2024-05-04 – 2024-05-10 (×12): 5 mg via ORAL
  Filled 2024-05-04 (×11): qty 1

## 2024-05-04 NOTE — Progress Notes (Signed)
 " PROGRESS NOTE    Edward Macias  FMW:[id number] DOB: May 26, 1960 DOA: 05/01/2024 PCP: Pcp, No     Brief Narrative:   Edward Macias is a 63 y.o. male with medical history significant of osteoarthritis, asthma, COPD, recurrent pneumonia and recurrent UTI who has been admitted twice recently with the same complaint of acute pyelonephritis and treated with antibiotics.  Last discharge was on the 18th.  Patient was also at [facility] where he was seen and treated.  During last hospitalization patient was seen by infectious disease.  Patient discharged on oral antibiotics but came back today with fever chills tachycardia and UTI.  Patient meets sepsis criteria again.  Previously had E. coli with septicemia and bacteremia.  Not sure if it is the same now.  Patient will be admitted for sepsis due to recurrent pyelonephritis.    Assessment & Plan:   Principal Problem:   Severe sepsis (HCC) Active Problems:   Peripheral neuropathy   History of asthma   COPD (chronic obstructive pulmonary disease) (HCC)   Tobacco abuse   Pyelonephritis   Hyponatremia   AKI (acute kidney injury)   Alcohol use disorder  # Pyelonephritis # Bacteremia Presented this month with urinary frequency, fever, chills. Found to have e coli bacteremia and e coli uti b/l pyelonephritis. Pan-sensitive, discharged on 2 week course of oral levofloxacin . Presented to outside hospital a couple of days later with persistent symptoms, there CT showed continued pyelo, possible microabscesses in kidneys, levofloxacin  continued. Presents here with lack of appetite, continues to feel feel very poorly, ongoing abdominal pain. Has markedly elevated leukocytosis of around 30. No fever and hemodynamically stable. CT shows ascending uti with bilateral pyelonephritis, no abscess or other complication. Bladder scan negative for retention. WBCs improving last several days, today 18.7. Abdominal pain and distention also improving. Urine culture from  admission is negative. Trace ascites and pleural effusions, too small to tap per radiology - ID following, recs appreciated - continue zosyn  per ID (as appears to have failed outpt quinolone are treating broadly, possible this is polymicrobial) - cont flomax   # AKI Baseline kidney function normal but during this illness cr has hovered around 1.7. No obstruction seen on CT. Worsening kidney function last two days, today cr 3.02 from 2.24 yesterday - continue fluids and monitor for now  # Alcohol use disorder Reports history heavy drinking, withdrawal symptoms when he stops. Last drink about 3 weeks ago, no s/s withdrawal currently - cont thiamine /folate - monitor  # COPD No exacerbation. Arrived on 2 liters and stable on that. Current sat 100% - breo for home advair  - will see about weaning o2  # Constipation Resolved with lactulose  - cont miralax , prn lactulose    DVT prophylaxis: lovenox  Code Status: full Family Communication: none at bedside. No answer when son telephoned today  Level of care: Med-Surg Status is: Inpatient Remains inpatient appropriate because: severity of illness, requiring IV antibiotics    Consultants:  ID  Procedures: none  Antimicrobials:  Cefepime  > cefazolin  > zosyn    Subjective: Appetite, abdominal pain, and abdominal distention all improving.   Objective: Vitals:   05/03/24 1510 05/03/24 2018 05/04/24 0413 05/04/24 0730  BP: 116/68 110/69 109/64 105/63  Pulse: (!) 101 (!) 104 83 88  Resp: 17 20 20 16   Temp: 98.4 F (36.9 C) 98.8 F (37.1 C) 98 F (36.7 C) 98 F (36.7 C)  TempSrc: Oral     SpO2: 98% 95% 98% 100%  Weight:      Height:  Intake/Output Summary (Last 24 hours) at 05/04/2024 1345 Last data filed at 05/04/2024 0901 Gross per 24 hour  Intake 2026.5 ml  Output 850 ml  Net 1176.5 ml   Filed Weights   05/01/24 1139  Weight: 67.6 kg    Examination:  General exam: Appears calm, ill Respiratory system:  Clear to auscultation. Respiratory effort normal. Cardiovascular system: S1 & S2 heard, RRR.   Gastrointestinal system: Abdomen is mildly distended, mild tenderness improved from admission Central nervous system: Alert and oriented. No focal neurological deficits. Extremities: Symmetric 5 x 5 power. Skin: No rashes, lesions or ulcers Psychiatry: Judgement and insight appear normal. Mood & affect appropriate.     Data Reviewed: I have personally reviewed following labs and imaging studies  CBC: Recent Labs  Lab 04/28/24 0522 05/01/24 1140 05/01/24 1548 05/01/24 2007 05/02/24 0546 05/03/24 0448 05/04/24 0753  WBC 24.4*   < > 28.3* 30.9* 26.7* 24.4* 18.7*  NEUTROABS 19.5*  --  23.6*  --   --   --   --   HGB 9.7*   < > 10.8* 9.4* 8.8* 9.2* 8.3*  HCT 27.2*   < > 31.8* 28.2* 25.6* 27.7* 24.1*  MCV 88.3   < > 91.4 91.6 90.8 91.4 89.6  PLT 374   < > 634* 547* 478* 445* 393   < > = values in this interval not displayed.   Basic Metabolic Panel: Recent Labs  Lab 04/28/24 0522 05/01/24 1140 05/01/24 2007 05/02/24 0546 05/03/24 0448 05/04/24 0753  NA 133* 133*  --  135 134* 132*  K 4.1 4.0  --  4.0 3.7 3.8  CL 96* 96*  --  100 97* 98  CO2 27 23  --  25 24 25   GLUCOSE 103* 109*  --  127* 100* 109*  BUN 13 20  --  21 25* 32*  CREATININE 1.25* 1.75* 1.76* 1.70* 2.24* 3.02*  CALCIUM 7.8* 8.5*  --  8.0* 7.9* 7.4*   GFR: Estimated Creatinine Clearance: 23.6 mL/min (A) (by C-G formula based on SCr of 3.02 mg/dL (H)). Liver Function Tests: Recent Labs  Lab 05/01/24 1140 05/02/24 0546  AST 46* 31  ALT 39 28  ALKPHOS 84 90  BILITOT 1.3* 1.2  PROT 7.0 6.3*  ALBUMIN 2.8* 2.6*   Recent Labs  Lab 05/01/24 1140  LIPASE 53*   No results for input(s): AMMONIA in the last 168 hours. Coagulation Profile: Recent Labs  Lab 05/01/24 1548 05/02/24 0546  INR 1.2 1.3*   Cardiac Enzymes: No results for input(s): CKTOTAL, CKMB, CKMBINDEX, TROPONINI in the last 168  hours. BNP (last 3 results) Recent Labs    05/01/24 1548  PROBNP 416.0*   HbA1C: No results for input(s): HGBA1C in the last 72 hours. CBG: Recent Labs  Lab 05/02/24 2308  GLUCAP 108*   Lipid Profile: No results for input(s): CHOL, HDL, LDLCALC, TRIG, CHOLHDL, LDLDIRECT in the last 72 hours. Thyroid Function Tests: No results for input(s): TSH, T4TOTAL, FREET4, T3FREE, THYROIDAB in the last 72 hours. Anemia Panel: No results for input(s): VITAMINB12, FOLATE, FERRITIN, TIBC, IRON, RETICCTPCT in the last 72 hours. Urine analysis:    Component Value Date/Time   COLORURINE YELLOW (A) 05/01/2024 1615   APPEARANCEUR CLOUDY (A) 05/01/2024 1615   APPEARANCEUR Clear 05/16/2020 1009   LABSPEC 1.008 05/01/2024 1615   PHURINE 6.0 05/01/2024 1615   GLUCOSEU NEGATIVE 05/01/2024 1615   HGBUR LARGE (A) 05/01/2024 1615   BILIRUBINUR NEGATIVE 05/01/2024 1615   BILIRUBINUR Negative 05/16/2020  1009   KETONESUR NEGATIVE 05/01/2024 1615   PROTEINUR 30 (A) 05/01/2024 1615   NITRITE NEGATIVE 05/01/2024 1615   LEUKOCYTESUR MODERATE (A) 05/01/2024 1615   Sepsis Labs: @LABRCNTIP (procalcitonin:4,lacticidven:4)  ) Recent Results (from the past 240 hours)  Culture, blood (routine x 2)     Status: None (Preliminary result)   Collection Time: 05/01/24 12:00 PM   Specimen: BLOOD  Result Value Ref Range Status   Specimen Description BLOOD BLOOD RIGHT ARM  Final   Special Requests   Final    BOTTLES DRAWN AEROBIC AND ANAEROBIC Blood Culture adequate volume   Culture   Final    NO GROWTH 3 DAYS Performed at [facility], [address] [place], [place] [address]    Report Status PENDING  Incomplete  Culture, blood (routine x 2)     Status: None (Preliminary result)   Collection Time: 05/01/24 12:42 PM   Specimen: BLOOD  Result Value Ref Range Status   Specimen Description BLOOD BLOOD LEFT ARM  Final   Special Requests   Final    BOTTLES DRAWN  AEROBIC AND ANAEROBIC Blood Culture adequate volume   Culture   Final    NO GROWTH 3 DAYS Performed at [facility], [address] [place], [place] [address]    Report Status PENDING  Incomplete  Urine Culture     Status: None   Collection Time: 05/01/24  4:15 PM   Specimen: Urine, Random  Result Value Ref Range Status   Specimen Description   Final    URINE, RANDOM Performed at [facility], [address], [place], [place] [address]    Special Requests   Final    NONE Reflexed from [id number] Performed at [facility], [address], [place], [place] [address]    Culture   Final    NO GROWTH Performed at [facility], [address], [place], [place] [address]    Report Status 05/03/2024 FINAL  Final  Resp panel by RT-PCR (RSV, Flu A&B, Covid) Anterior Nasal Swab     Status: None   Collection Time: 05/01/24  5:30 PM   Specimen: Anterior Nasal Swab  Result Value Ref Range Status   SARS Coronavirus 2 by RT PCR NEGATIVE NEGATIVE Final    Comment: (NOTE) SARS-CoV-2 target nucleic acids are NOT DETECTED.  The SARS-CoV-2 RNA is generally detectable in upper respiratory specimens during the acute phase of infection. The lowest concentration of SARS-CoV-2 viral copies this assay can detect is 138 copies/mL. A negative result does not preclude SARS-Cov-2 infection and should not be used as the sole basis for treatment or other patient management decisions. A negative result may occur with  improper specimen collection/handling, submission of specimen other than nasopharyngeal swab, presence of viral mutation(s) within the areas targeted by this assay, and inadequate number of viral copies(<138 copies/mL). A negative result must be combined with clinical observations, patient history, and epidemiological information. The expected result is Negative.  Fact Sheet for Patients:  bloggercourse.com  Fact Sheet for Healthcare  Providers:  seriousbroker.it  This test is no t yet approved or cleared by the [place]  FDA and  has been authorized for detection and/or diagnosis of SARS-CoV-2 by FDA under an Emergency Use Authorization (EUA). This EUA will remain  in effect (meaning this test can be used) for the duration of the COVID-19 declaration under Section 564(b)(1) of the Act, 21 U.S.C.section 360bbb-3(b)(1), unless the authorization is terminated  or revoked sooner.  Influenza A by PCR NEGATIVE NEGATIVE Final   Influenza B by PCR NEGATIVE NEGATIVE Final    Comment: (NOTE) The Xpert Xpress SARS-CoV-2/FLU/RSV plus assay is intended as an aid in the diagnosis of influenza from Nasopharyngeal swab specimens and should not be used as a sole basis for treatment. Nasal washings and aspirates are unacceptable for Xpert Xpress SARS-CoV-2/FLU/RSV testing.  Fact Sheet for Patients: bloggercourse.com  Fact Sheet for Healthcare Providers: seriousbroker.it  This test is not yet approved or cleared by the [place]  FDA and has been authorized for detection and/or diagnosis of SARS-CoV-2 by FDA under an Emergency Use Authorization (EUA). This EUA will remain in effect (meaning this test can be used) for the duration of the COVID-19 declaration under Section 564(b)(1) of the Act, 21 U.S.C. section 360bbb-3(b)(1), unless the authorization is terminated or revoked.     Resp Syncytial Virus by PCR NEGATIVE NEGATIVE Final    Comment: (NOTE) Fact Sheet for Patients: bloggercourse.com  Fact Sheet for Healthcare Providers: seriousbroker.it  This test is not yet approved or cleared by the [place]  FDA and has been authorized for detection and/or diagnosis of SARS-CoV-2 by FDA under an Emergency Use Authorization (EUA). This EUA will remain in effect (meaning this test can be  used) for the duration of the COVID-19 declaration under Section 564(b)(1) of the Act, 21 U.S.C. section 360bbb-3(b)(1), unless the authorization is terminated or revoked.  Performed at [facility], [address], [place], [place] [address]          Radiology Studies: No results found.       Scheduled Meds:  amLODipine   10 mg Oral Daily   enoxaparin  (LOVENOX ) injection  30 mg Subcutaneous Q24H   fluticasone  furoate-vilanterol  1 puff Inhalation Daily   folic acid   1 mg Oral Daily   loratadine   10 mg Oral Daily   tamsulosin   0.4 mg Oral QPC supper   thiamine   100 mg Oral Daily   Continuous Infusions:  piperacillin -tazobactam (ZOSYN )  IV 3.375 g (05/04/24 1310)     LOS: 3 days     Devaughn KATHEE Ban, MD [facility]   If 7PM-7AM, please contact night-coverage [email or web address] Password [email or web address] 05/04/2024, 1:45 PM     "

## 2024-05-04 NOTE — Plan of Care (Signed)
  Problem: Fluid Volume: Goal: Hemodynamic stability will improve Outcome: Progressing   Problem: Clinical Measurements: Goal: Diagnostic test results will improve Outcome: Progressing Goal: Signs and symptoms of infection will decrease Outcome: Progressing   Problem: Respiratory: Goal: Ability to maintain adequate ventilation will improve Outcome: Progressing   Problem: Education: Goal: Knowledge of General Education information will improve Description: Including pain rating scale, medication(s)/side effects and non-pharmacologic comfort measures Outcome: Progressing   Problem: Health Behavior/Discharge Planning: Goal: Ability to manage health-related needs will improve Outcome: Progressing   Problem: Clinical Measurements: Goal: Ability to maintain clinical measurements within normal limits will improve Outcome: Progressing Goal: Will remain free from infection Outcome: Progressing Goal: Diagnostic test results will improve Outcome: Progressing Goal: Respiratory complications will improve Outcome: Progressing Goal: Cardiovascular complication will be avoided Outcome: Progressing   Problem: Activity: Goal: Risk for activity intolerance will decrease Outcome: Progressing   Problem: Nutrition: Goal: Adequate nutrition will be maintained Outcome: Progressing   Problem: Coping: Goal: Level of anxiety will decrease Outcome: Progressing   Problem: Elimination: Goal: Will not experience complications related to bowel motility Outcome: Progressing Goal: Will not experience complications related to urinary retention Outcome: Progressing   Problem: Pain Managment: Goal: General experience of comfort will improve and/or be controlled Outcome: Progressing   Problem: Safety: Goal: Ability to remain free from injury will improve Outcome: Progressing   Problem: Skin Integrity: Goal: Risk for impaired skin integrity will decrease Outcome: Progressing   Problem:  Education: Goal: Knowledge of General Education information will improve Description: Including pain rating scale, medication(s)/side effects and non-pharmacologic comfort measures Outcome: Progressing   Problem: Health Behavior/Discharge Planning: Goal: Ability to manage health-related needs will improve Outcome: Progressing   Problem: Clinical Measurements: Goal: Ability to maintain clinical measurements within normal limits will improve Outcome: Progressing Goal: Will remain free from infection Outcome: Progressing Goal: Diagnostic test results will improve Outcome: Progressing Goal: Respiratory complications will improve Outcome: Progressing Goal: Cardiovascular complication will be avoided Outcome: Progressing   Problem: Activity: Goal: Risk for activity intolerance will decrease Outcome: Progressing   Problem: Nutrition: Goal: Adequate nutrition will be maintained Outcome: Progressing   Problem: Coping: Goal: Level of anxiety will decrease Outcome: Progressing   Problem: Elimination: Goal: Will not experience complications related to bowel motility Outcome: Progressing Goal: Will not experience complications related to urinary retention Outcome: Progressing   Problem: Pain Managment: Goal: General experience of comfort will improve and/or be controlled Outcome: Progressing   Problem: Safety: Goal: Ability to remain free from injury will improve Outcome: Progressing   Problem: Skin Integrity: Goal: Risk for impaired skin integrity will decrease Outcome: Progressing

## 2024-05-04 NOTE — Progress Notes (Signed)
 PHARMACIST - PHYSICIAN COMMUNICATION  CONCERNING:  Enoxaparin  (Lovenox ) for DVT Prophylaxis    RECOMMENDATION: Patient was prescribed enoxaprin 40mg  q24 hours for VTE prophylaxis.   Filed Weights   05/01/24 1139  Weight: 67.6 kg (149 lb)    Body mass index is 22 kg/m.  Estimated Creatinine Clearance: 23.6 mL/min (A) (by C-G formula based on SCr of 3.02 mg/dL (H)).  Patient is candidate for enoxaparin  30mg  every 24 hours based on CrCl <36ml/min or Weight <45kg  DESCRIPTION: Pharmacy has adjusted enoxaparin  dose per [facility] policy.  Patient is now receiving enoxaparin  30 mg every 24 hours    Lum VEAR Mania, PharmD Clinical Pharmacist  05/04/2024 1:09 PM

## 2024-05-05 ENCOUNTER — Inpatient Hospital Stay

## 2024-05-05 DIAGNOSIS — A419 Sepsis, unspecified organism: Secondary | ICD-10-CM | POA: Diagnosis not present

## 2024-05-05 DIAGNOSIS — N151 Renal and perinephric abscess: Secondary | ICD-10-CM

## 2024-05-05 DIAGNOSIS — N390 Urinary tract infection, site not specified: Secondary | ICD-10-CM | POA: Diagnosis not present

## 2024-05-05 DIAGNOSIS — N179 Acute kidney failure, unspecified: Secondary | ICD-10-CM

## 2024-05-05 DIAGNOSIS — D72829 Elevated white blood cell count, unspecified: Secondary | ICD-10-CM | POA: Diagnosis not present

## 2024-05-05 DIAGNOSIS — B182 Chronic viral hepatitis C: Secondary | ICD-10-CM | POA: Diagnosis not present

## 2024-05-05 DIAGNOSIS — J449 Chronic obstructive pulmonary disease, unspecified: Secondary | ICD-10-CM

## 2024-05-05 DIAGNOSIS — F1010 Alcohol abuse, uncomplicated: Secondary | ICD-10-CM

## 2024-05-05 DIAGNOSIS — N12 Tubulo-interstitial nephritis, not specified as acute or chronic: Secondary | ICD-10-CM | POA: Diagnosis not present

## 2024-05-05 DIAGNOSIS — B9620 Unspecified Escherichia coli [E. coli] as the cause of diseases classified elsewhere: Secondary | ICD-10-CM

## 2024-05-05 DIAGNOSIS — F17210 Nicotine dependence, cigarettes, uncomplicated: Secondary | ICD-10-CM | POA: Diagnosis not present

## 2024-05-05 DIAGNOSIS — R6520 Severe sepsis without septic shock: Secondary | ICD-10-CM | POA: Diagnosis not present

## 2024-05-05 DIAGNOSIS — B1920 Unspecified viral hepatitis C without hepatic coma: Secondary | ICD-10-CM | POA: Insufficient documentation

## 2024-05-05 LAB — HEPATIC FUNCTION PANEL
ALT: 14 U/L (ref 0–44)
AST: 27 U/L (ref 15–41)
Albumin: 2.8 g/dL — ABNORMAL LOW (ref 3.5–5.0)
Alkaline Phosphatase: 62 U/L (ref 38–126)
Bilirubin, Direct: 0.2 mg/dL (ref 0.0–0.2)
Indirect Bilirubin: 0.2 mg/dL — ABNORMAL LOW (ref 0.3–0.9)
Total Bilirubin: 0.4 mg/dL (ref 0.0–1.2)
Total Protein: 6.9 g/dL (ref 6.5–8.1)

## 2024-05-05 LAB — BASIC METABOLIC PANEL WITH GFR
Anion gap: 13 (ref 5–15)
BUN: 39 mg/dL — ABNORMAL HIGH (ref 8–23)
CO2: 24 mmol/L (ref 22–32)
Calcium: 7.5 mg/dL — ABNORMAL LOW (ref 8.9–10.3)
Chloride: 98 mmol/L (ref 98–111)
Creatinine, Ser: 3.94 mg/dL — ABNORMAL HIGH (ref 0.61–1.24)
GFR, Estimated: 16 mL/min — ABNORMAL LOW
Glucose, Bld: 143 mg/dL — ABNORMAL HIGH (ref 70–99)
Potassium: 3.6 mmol/L (ref 3.5–5.1)
Sodium: 135 mmol/L (ref 135–145)

## 2024-05-05 LAB — CBC
HCT: 23.7 % — ABNORMAL LOW (ref 39.0–52.0)
Hemoglobin: 8 g/dL — ABNORMAL LOW (ref 13.0–17.0)
MCH: 30.8 pg (ref 26.0–34.0)
MCHC: 33.8 g/dL (ref 30.0–36.0)
MCV: 91.2 fL (ref 80.0–100.0)
Platelets: 389 K/uL (ref 150–400)
RBC: 2.6 MIL/uL — ABNORMAL LOW (ref 4.22–5.81)
RDW: 16.4 % — ABNORMAL HIGH (ref 11.5–15.5)
WBC: 16.9 K/uL — ABNORMAL HIGH (ref 4.0–10.5)
nRBC: 0 % (ref 0.0–0.2)

## 2024-05-05 LAB — CK: Total CK: 30 U/L — ABNORMAL LOW (ref 49–397)

## 2024-05-05 MED ORDER — SODIUM CHLORIDE 0.9 % IV SOLN: INTRAVENOUS | Status: DC

## 2024-05-05 MED ORDER — HEPARIN SODIUM (PORCINE) 5000 UNIT/ML IJ SOLN
5000.0000 [IU] | Freq: Three times a day (TID) | INTRAMUSCULAR | Status: DC
  Administered 2024-05-05 – 2024-05-10 (×15): 5000 [IU] via SUBCUTANEOUS
  Filled 2024-05-05 (×11): qty 1

## 2024-05-05 MED ORDER — PIPERACILLIN-TAZOBACTAM IN DEX 2-0.25 GM/50ML IV SOLN
2.2500 g | Freq: Three times a day (TID) | INTRAVENOUS | Status: DC
  Administered 2024-05-05 – 2024-05-06 (×4): 2.25 g via INTRAVENOUS
  Filled 2024-05-05 (×4): qty 50

## 2024-05-05 NOTE — Progress Notes (Signed)
 PHARMACY NOTE:  ANTIMICROBIAL RENAL DOSAGE ADJUSTMENT  Current antimicrobial regimen includes a mismatch between antimicrobial dosage and estimated renal function.  As per policy approved by the Pharmacy & Therapeutics and Medical Executive Committees, the antimicrobial dosage will be adjusted accordingly.  Current antimicrobial dosage:  piperacillin -tazobactam 3.375 grams IV every 8 hours  Indication: pyelonephritis with prior admit here with E coli bacteremia   Renal Function:  Estimated Creatinine Clearance: 18.1 mL/min (A) (by C-G formula based on SCr of 3.94 mg/dL (H)).    Antimicrobial dosage has been changed to:  piperacillin -tazobactam 2.25 grams IV every 8 hours   Thank you for allowing pharmacy to be a part of this patient's care.  Adriana JONETTA Bolster, [facility] 05/05/2024 7:38 AM

## 2024-05-05 NOTE — Progress Notes (Addendum)
 " PROGRESS NOTE    Edward Macias  FMW:[id number] DOB: [birth year] DOA: 05/01/2024 PCP: Pcp, No     Brief Narrative:   Edward Macias is a 63 y.o. male with medical history significant of osteoarthritis, asthma, COPD, recurrent pneumonia and recurrent UTI who has been admitted twice recently with the same complaint of acute pyelonephritis and treated with antibiotics.  Last discharge was on the 18th.  Patient was also at [facility] where he was seen and treated.  During last hospitalization patient was seen by infectious disease.  Patient discharged on oral antibiotics but came back today with fever chills tachycardia and UTI.  Patient meets sepsis criteria again.  Previously had E. coli with septicemia and bacteremia.  Not sure if it is the same now.  Patient will be admitted for sepsis due to recurrent pyelonephritis.    Assessment & Plan:   Principal Problem:   Severe sepsis (HCC) Active Problems:   Peripheral neuropathy   History of asthma   COPD (chronic obstructive pulmonary disease) (HCC)   Tobacco abuse   Pyelonephritis   Hyponatremia   AKI (acute kidney injury)   Alcohol use disorder   Hepatitis C infection  # Pyelonephritis # Bacteremia Presented this month with urinary frequency, fever, chills. Found to have e coli bacteremia and e coli uti b/l pyelonephritis. Pan-sensitive, discharged on 2 week course of oral levofloxacin . Presented to outside hospital a couple of days later with persistent symptoms, there CT showed continued pyelo, possible microabscesses in kidneys, levofloxacin  continued. Presents here with lack of appetite, continues to feel feel very poorly, ongoing abdominal pain. Has markedly elevated leukocytosis of around 30. No fever and hemodynamically stable. CT shows ascending uti with bilateral pyelonephritis, no abscess or other complication. Bladder scan negative for retention. WBCs improving. Abdominal pain and distention also improving. Urine culture from admission is  negative. Trace ascites and pleural effusions, too small to tap per radiology - ID following, recs appreciated - continue zosyn  per ID (as appears to have failed outpt quinolone are treating broadly, possible this is polymicrobial) - cont flomax   # AKI Baseline kidney function normal but during this illness cr has hovered around 1.7. No obstruction seen on CT and on renal u/s today. Worsening kidney function last three days, today cr 3.94 from 2.24 yesterday. Uop also appears to be low. Likely contrast-induced nephropathy, has had three contrast-enhanced CTs within the past two weeks. - continue fluids - strict I/Os, both nursing and patient are aware - nephrology consult if kidney function continues to decline  # Alcohol use disorder Reports history heavy drinking, withdrawal symptoms when he stops. Last drink about 3 weeks ago, no s/s withdrawal currently - cont thiamine /folate - monitor  # Hepatitis C  New diagnosis based on labs obtained at [facility] this month. Signs of cirrhosis on CT. Platelets normal, albumin low, LFTs wnl, inr mild elevation - outpt f/u  # COPD No exacerbation. Arrived on 2 liters and stable on that.   - breo for home advair  - wean o2 as able  # HTN Her bp low normal - hold home amlodipine   # Constipation Resolved with lactulose  - cont miralax , prn lactulose    DVT prophylaxis: lovenox  Code Status: full Family Communication: son donald telephonically 12/25  Level of care: Med-Surg Status is: Inpatient Remains inpatient appropriate because: severity of illness, requiring IV antibiotics    Consultants:  ID  Procedures: none  Antimicrobials:  Cefepime  > cefazolin  > zosyn    Subjective: Appetite, abdominal pain, and abdominal distention  all continue to improve. Not much uop thus far today.   Objective: Vitals:   05/04/24 1427 05/04/24 2020 05/05/24 0403 05/05/24 0744  BP: 120/70 100/66 94/60 109/62  Pulse: 88 85 87 84  Resp: 17 15 15 16    Temp: 98.3 F (36.8 C) 98.1 F (36.7 C) 97.9 F (36.6 C) 98.4 F (36.9 C)  TempSrc:  Oral Oral   SpO2: 98% 99% 97% 90%  Weight:      Height:        Intake/Output Summary (Last 24 hours) at 05/05/2024 1506 Last data filed at 05/05/2024 0950 Gross per 24 hour  Intake 440 ml  Output 250 ml  Net 190 ml   Filed Weights   05/01/24 1139  Weight: 67.6 kg    Examination:  General exam: Appears calm, ill Respiratory system: Clear to auscultation. Respiratory effort normal. Cardiovascular system: S1 & S2 heard, RRR.   Gastrointestinal system: Abdomen is mildly distended, mild tenderness improved from admission Central nervous system: Alert and oriented. No focal neurological deficits. Extremities: Symmetric 5 x 5 power. Skin: No rashes, lesions or ulcers Psychiatry: Judgement and insight appear normal. Mood & affect appropriate.     Data Reviewed: I have personally reviewed following labs and imaging studies  CBC: Recent Labs  Lab 05/01/24 1548 05/01/24 2007 05/02/24 0546 05/03/24 0448 05/04/24 0753 05/05/24 0454  WBC 28.3* 30.9* 26.7* 24.4* 18.7* 16.9*  NEUTROABS 23.6*  --   --   --   --   --   HGB 10.8* 9.4* 8.8* 9.2* 8.3* 8.0*  HCT 31.8* 28.2* 25.6* 27.7* 24.1* 23.7*  MCV 91.4 91.6 90.8 91.4 89.6 91.2  PLT 634* 547* 478* 445* 393 389   Basic Metabolic Panel: Recent Labs  Lab 05/01/24 1140 05/01/24 2007 05/02/24 0546 05/03/24 0448 05/04/24 0753 05/05/24 0454  NA 133*  --  135 134* 132* 135  K 4.0  --  4.0 3.7 3.8 3.6  CL 96*  --  100 97* 98 98  CO2 23  --  25 24 25 24   GLUCOSE 109*  --  127* 100* 109* 143*  BUN 20  --  21 25* 32* 39*  CREATININE 1.75* 1.76* 1.70* 2.24* 3.02* 3.94*  CALCIUM 8.5*  --  8.0* 7.9* 7.4* 7.5*   GFR: Estimated Creatinine Clearance: 18.1 mL/min (A) (by C-G formula based on SCr of 3.94 mg/dL (H)). Liver Function Tests: Recent Labs  Lab 05/01/24 1140 05/02/24 0546 05/05/24 1421  AST 46* 31 27  ALT 39 28 14  ALKPHOS 84  90 62  BILITOT 1.3* 1.2 0.4  PROT 7.0 6.3* 6.9  ALBUMIN 2.8* 2.6* 2.8*   Recent Labs  Lab 05/01/24 1140  LIPASE 53*   No results for input(s): AMMONIA in the last 168 hours. Coagulation Profile: Recent Labs  Lab 05/01/24 1548 05/02/24 0546  INR 1.2 1.3*   Cardiac Enzymes: Recent Labs  Lab 05/05/24 1421  CKTOTAL 30*   BNP (last 3 results) Recent Labs    05/01/24 1548  PROBNP 416.0*   HbA1C: No results for input(s): HGBA1C in the last 72 hours. CBG: Recent Labs  Lab 05/02/24 2308  GLUCAP 108*   Lipid Profile: No results for input(s): CHOL, HDL, LDLCALC, TRIG, CHOLHDL, LDLDIRECT in the last 72 hours. Thyroid Function Tests: No results for input(s): TSH, T4TOTAL, FREET4, T3FREE, THYROIDAB in the last 72 hours. Anemia Panel: No results for input(s): VITAMINB12, FOLATE, FERRITIN, TIBC, IRON, RETICCTPCT in the last 72 hours. Urine analysis:  Component Value Date/Time   COLORURINE YELLOW (A) 05/01/2024 1615   APPEARANCEUR CLOUDY (A) 05/01/2024 1615   APPEARANCEUR Clear 05/16/2020 1009   LABSPEC 1.008 05/01/2024 1615   PHURINE 6.0 05/01/2024 1615   GLUCOSEU NEGATIVE 05/01/2024 1615   HGBUR LARGE (A) 05/01/2024 1615   BILIRUBINUR NEGATIVE 05/01/2024 1615   BILIRUBINUR Negative 05/16/2020 1009   KETONESUR NEGATIVE 05/01/2024 1615   PROTEINUR 30 (A) 05/01/2024 1615   NITRITE NEGATIVE 05/01/2024 1615   LEUKOCYTESUR MODERATE (A) 05/01/2024 1615   Sepsis Labs: @LABRCNTIP (procalcitonin:4,lacticidven:4)  ) Recent Results (from the past 240 hours)  Culture, blood (routine x 2)     Status: None (Preliminary result)   Collection Time: 05/01/24 12:00 PM   Specimen: BLOOD  Result Value Ref Range Status   Specimen Description BLOOD BLOOD RIGHT ARM  Final   Special Requests   Final    BOTTLES DRAWN AEROBIC AND ANAEROBIC Blood Culture adequate volume   Culture   Final    NO GROWTH 4 DAYS Performed at [facility], [address], [place], [place] [address]    Report Status PENDING  Incomplete  Culture, blood (routine x 2)     Status: None (Preliminary result)   Collection Time: 05/01/24 12:42 PM   Specimen: BLOOD  Result Value Ref Range Status   Specimen Description BLOOD BLOOD LEFT ARM  Final   Special Requests   Final    BOTTLES DRAWN AEROBIC AND ANAEROBIC Blood Culture adequate volume   Culture   Final    NO GROWTH 4 DAYS Performed at [facility], [address], [place], [place] [address]    Report Status PENDING  Incomplete  Urine Culture     Status: None   Collection Time: 05/01/24  4:15 PM   Specimen: Urine, Random  Result Value Ref Range Status   Specimen Description   Final    URINE, RANDOM Performed at [facility], [address] [place], [place] [address]    Special Requests   Final    NONE Reflexed from [phone] [id number] Performed at [facility], [address], [place], [place] [address]    Culture   Final    NO GROWTH Performed at [facility], [address] [place], [place] [address]    Report Status 05/03/2024 FINAL  Final  Resp panel by RT-PCR (RSV, Flu A&B, Covid) Anterior Nasal Swab     Status: None   Collection Time: 05/01/24  5:30 PM   Specimen: Anterior Nasal Swab  Result Value Ref Range Status   SARS Coronavirus 2 by RT PCR NEGATIVE NEGATIVE Final    Comment: (NOTE) SARS-CoV-2 target nucleic acids are NOT DETECTED.  The SARS-CoV-2 RNA is generally detectable in upper respiratory specimens during the acute phase of infection. The lowest concentration of SARS-CoV-2 viral copies this assay can detect is 138 copies/mL. A negative result does not preclude SARS-Cov-2 infection and should not be used as the sole basis for treatment or other patient management decisions. A negative result may occur with  improper specimen collection/handling, submission of specimen other than nasopharyngeal swab, presence of viral mutation(s) within  the areas targeted by this assay, and inadequate number of viral copies(<138 copies/mL). A negative result must be combined with clinical observations, patient history, and epidemiological information. The expected result is Negative.  Fact Sheet for Patients:  bloggercourse.com  Fact Sheet for Healthcare Providers:  seriousbroker.it  This test is no t yet approved or cleared by the [place]   FDA and  has been authorized for detection and/or diagnosis of SARS-CoV-2 by FDA under an Emergency Use Authorization (EUA). This EUA will remain  in effect (meaning this test can be used) for the duration of the COVID-19 declaration under Section 564(b)(1) of the Act, 21 U.S.C.section 360bbb-3(b)(1), unless the authorization is terminated  or revoked sooner.       Influenza A by PCR NEGATIVE NEGATIVE Final   Influenza B by PCR NEGATIVE NEGATIVE Final    Comment: (NOTE) The Xpert Xpress SARS-CoV-2/FLU/RSV plus assay is intended as an aid in the diagnosis of influenza from Nasopharyngeal swab specimens and should not be used as a sole basis for treatment. Nasal washings and aspirates are unacceptable for Xpert Xpress SARS-CoV-2/FLU/RSV testing.  Fact Sheet for Patients: bloggercourse.com  Fact Sheet for Healthcare Providers: seriousbroker.it  This test is not yet approved or cleared by the [place]  FDA and has been authorized for detection and/or diagnosis of SARS-CoV-2 by FDA under an Emergency Use Authorization (EUA). This EUA will remain in effect (meaning this test can be used) for the duration of the COVID-19 declaration under Section 564(b)(1) of the Act, 21 U.S.C. section 360bbb-3(b)(1), unless the authorization is terminated or revoked.     Resp Syncytial Virus by PCR NEGATIVE NEGATIVE Final    Comment: (NOTE) Fact Sheet for  Patients: bloggercourse.com  Fact Sheet for Healthcare Providers: seriousbroker.it  This test is not yet approved or cleared by the [place]  FDA and has been authorized for detection and/or diagnosis of SARS-CoV-2 by FDA under an Emergency Use Authorization (EUA). This EUA will remain in effect (meaning this test can be used) for the duration of the COVID-19 declaration under Section 564(b)(1) of the Act, 21 U.S.C. section 360bbb-3(b)(1), unless the authorization is terminated or revoked.  Performed at [facility], [address], [place], [place] [address]          Radiology Studies: US  RENAL Result Date: 05/05/2024 EXAM: US  Retroperitoneum Complete, Renal. 05/05/2024 10:25:47 AM TECHNIQUE: Real-time ultrasonography of the retroperitoneum renal was performed. COMPARISON: None available CLINICAL HISTORY: AKI (acute kidney injury). FINDINGS: FINDINGS: RIGHT KIDNEY/URETER: Right kidney measures 12.3 x 5.1 x 6.2 cm. Normal cortical echogenicity. No hydronephrosis. No calculus. No mass. LEFT KIDNEY/URETER: Left kidney measures 11.1 x 6.9 x 5.6 cm. Normal cortical echogenicity. No hydronephrosis. No calculus. No mass. BLADDER: Circumferential wall thickening of the urinary bladder, which may be due to underdistention. If there is concern for acute cystitis, correlation with urinalysis would be recommended. PLEURAL SPACES: Right pleural effusion. IMPRESSION: 1. Circumferential wall thickening of the urinary bladder, which may be due to underdistention .if there is concern for acute cystitis, correlation with urinalysis would be recommended. 2. No hydronephrosis or nephrolithiasis. 3. Right pleural effusion. Electronically signed by: Rogelia Myers MD 05/05/2024 02:10 PM EST RP Workstation: [id number]         Scheduled Meds:  amLODipine   10 mg Oral Daily   enoxaparin  (LOVENOX ) injection  30 mg Subcutaneous Q24H    fluticasone  furoate-vilanterol  1 puff Inhalation Daily   folic acid   1 mg Oral Daily   loratadine   10 mg Oral Daily   tamsulosin   0.4 mg Oral QPC supper   thiamine   100 mg Oral Daily   Continuous Infusions:  sodium chloride      piperacillin -tazobactam (ZOSYN )  IV 2.25 g (05/05/24 1330)     LOS: 4 days     Devaughn KATHEE Ban, MD [facility]   If 7PM-7AM, please contact night-coverage [email or web address]  Password [email or web address] 05/05/2024, 3:06 PM     "

## 2024-05-05 NOTE — Progress Notes (Signed)
 "  Date of Admission:  05/01/2024      ID: Edward Macias is a 63 y.o. male Principal Problem:   Severe sepsis (HCC) Active Problems:   Peripheral neuropathy   History of asthma   COPD (chronic obstructive pulmonary disease) (HCC)   Tobacco abuse   Pyelonephritis   Hyponatremia   AKI (acute kidney injury)   Alcohol use disorder    Subjective: Pt feeling a little better today Abdomen less bloated and painful Sat in chair  Medications:   amLODipine   10 mg Oral Daily   enoxaparin  (LOVENOX ) injection  30 mg Subcutaneous Q24H   fluticasone  furoate-vilanterol  1 puff Inhalation Daily   folic acid   1 mg Oral Daily   loratadine   10 mg Oral Daily   tamsulosin   0.4 mg Oral QPC supper   thiamine   100 mg Oral Daily    Objective: Vital signs in last 24 hours: Patient Vitals for the past 24 hrs:  BP Temp Temp src Pulse Resp SpO2  05/05/24 0744 109/62 98.4 F (36.9 C) -- 84 16 90 %  05/05/24 0403 94/60 97.9 F (36.6 C) Oral 87 15 97 %  05/04/24 2020 100/66 98.1 F (36.7 C) Oral 85 15 99 %  05/04/24 1427 120/70 98.3 F (36.8 C) -- 88 17 98 %      PHYSICAL EXAM:  General: Alert, cooperative, no distress, appears stated age.  Lungs:b/l air entry Heart: Regular rate and rhythm, no murmur, rub or gallop. Abdomen: Soft, some tenderness to palpation Extremities: atraumatic, no cyanosis. No edema. No clubbing Skin: No rashes or lesions. Or bruising Lymph: Cervical, supraclavicular normal. Neurologic: Grossly non-focal  Lab Results    Latest Ref Rng & Units 05/05/2024    4:54 AM 05/04/2024    7:53 AM 05/03/2024    4:48 AM  CBC  WBC 4.0 - 10.5 K/uL 16.9  18.7  24.4   Hemoglobin 13.0 - 17.0 g/dL 8.0  8.3  9.2   Hematocrit 39.0 - 52.0 % 23.7  24.1  27.7   Platelets 150 - 400 K/uL 389  393  445        Latest Ref Rng & Units 05/05/2024    4:54 AM 05/04/2024    7:53 AM 05/03/2024    4:48 AM  CMP  Glucose 70 - 99 mg/dL 856  890  899   BUN 8 - 23 mg/dL 39  32  25    Creatinine 0.61 - 1.24 mg/dL 6.05  6.97  7.75   Sodium 135 - 145 mmol/L 135  132  134   Potassium 3.5 - 5.1 mmol/L 3.6  3.8  3.7   Chloride 98 - 111 mmol/L 98  98  97   CO2 22 - 32 mmol/L 24  25  24    Calcium 8.9 - 10.3 mg/dL 7.5  7.4  7.9       Microbiology: 04/23/24 BC- 2/2 sets pan sensitive Ecoli 04/23/24 UC Ecoli 05/01/24 BC- NG UC- NG Studies/Results: CT abdomen and pelvis with contrast shows mild irregularity of the liver contour suspicious for cirrhosis Heterogeneous nephrogram bilaterally consistent with pyelonephritis No abscess Enhancement of the urothelium of the ureters consistent with ascending UTI. The prostate and seminal vesicles are grossly unremarkable  Assessment/Plan: 63 year old male with history for COPD with hospitalization in October and November for COPD exacerbation now been in and out of hospital since 04/23/2024 for complicated urinary tract infection with E. coli.  Had E. coli bacteremia on 04/23/2024 Pansensitive E. coli He  was placed initially on ceftriaxone  and then on p.o. Levaquin  but has been in and out of [facility] and [facility] since then.  The leukocytosis has been slow to respond He is started on Zosyn  this admission Leukocytosis has declined from 30.9-16.9. This is a pansensitive E. coli.  Patient noted to have complicated pyelonephritis with questionable microabscesses in the right lower lobe wall area so the plan was to give him at least 2 to 3 weeks of antibiotics Now that the leukocytosis is declining, very soon we will be able to switch him back to Levaquin .  The duration depends on when he normalizes the WBC  Hepatitis C active- will have to see GI as OP for treatment Cirrhosis by imaging  ETOH abuse  AKI-worsening creatinine could be related to the contrast he has received with the CAT scans he has had 3 CTs with contrast in the last 2 weeks Monitor closely Check PVR to make sure there is no incomplete emptying   COPD  Discussed the  management with the patient and with Dr. kandis   "

## 2024-05-06 DIAGNOSIS — R6520 Severe sepsis without septic shock: Secondary | ICD-10-CM | POA: Diagnosis not present

## 2024-05-06 DIAGNOSIS — A419 Sepsis, unspecified organism: Secondary | ICD-10-CM | POA: Diagnosis not present

## 2024-05-06 DIAGNOSIS — N12 Tubulo-interstitial nephritis, not specified as acute or chronic: Secondary | ICD-10-CM | POA: Diagnosis not present

## 2024-05-06 DIAGNOSIS — N390 Urinary tract infection, site not specified: Secondary | ICD-10-CM | POA: Diagnosis not present

## 2024-05-06 DIAGNOSIS — N151 Renal and perinephric abscess: Secondary | ICD-10-CM | POA: Diagnosis not present

## 2024-05-06 DIAGNOSIS — N179 Acute kidney failure, unspecified: Secondary | ICD-10-CM | POA: Diagnosis not present

## 2024-05-06 DIAGNOSIS — F17210 Nicotine dependence, cigarettes, uncomplicated: Secondary | ICD-10-CM

## 2024-05-06 LAB — CBC
HCT: 23.9 % — ABNORMAL LOW (ref 39.0–52.0)
Hemoglobin: 8 g/dL — ABNORMAL LOW (ref 13.0–17.0)
MCH: 30.4 pg (ref 26.0–34.0)
MCHC: 33.5 g/dL (ref 30.0–36.0)
MCV: 90.9 fL (ref 80.0–100.0)
Platelets: 363 K/uL (ref 150–400)
RBC: 2.63 MIL/uL — ABNORMAL LOW (ref 4.22–5.81)
RDW: 16.3 % — ABNORMAL HIGH (ref 11.5–15.5)
WBC: 15 K/uL — ABNORMAL HIGH (ref 4.0–10.5)
nRBC: 0 % (ref 0.0–0.2)

## 2024-05-06 LAB — BASIC METABOLIC PANEL WITH GFR
Anion gap: 11 (ref 5–15)
BUN: 39 mg/dL — ABNORMAL HIGH (ref 8–23)
CO2: 23 mmol/L (ref 22–32)
Calcium: 7.4 mg/dL — ABNORMAL LOW (ref 8.9–10.3)
Chloride: 99 mmol/L (ref 98–111)
Creatinine, Ser: 3.94 mg/dL — ABNORMAL HIGH (ref 0.61–1.24)
GFR, Estimated: 16 mL/min — ABNORMAL LOW
Glucose, Bld: 89 mg/dL (ref 70–99)
Potassium: 3.7 mmol/L (ref 3.5–5.1)
Sodium: 132 mmol/L — ABNORMAL LOW (ref 135–145)

## 2024-05-06 LAB — CULTURE, BLOOD (ROUTINE X 2)
Culture: NO GROWTH
Culture: NO GROWTH
Special Requests: ADEQUATE
Special Requests: ADEQUATE

## 2024-05-06 MED ORDER — LEVOFLOXACIN 750 MG PO TABS
750.0000 mg | ORAL_TABLET | Freq: Once | ORAL | Status: AC
  Administered 2024-05-06: 750 mg via ORAL
  Filled 2024-05-06: qty 1

## 2024-05-06 MED ORDER — SODIUM CHLORIDE 0.9 % IV SOLN: INTRAVENOUS | Status: AC

## 2024-05-06 MED ORDER — LEVOFLOXACIN 500 MG PO TABS
500.0000 mg | ORAL_TABLET | ORAL | Status: DC
  Administered 2024-05-08 – 2024-05-10 (×2): 500 mg via ORAL
  Filled 2024-05-06: qty 1

## 2024-05-06 NOTE — TOC Progression Note (Signed)
 Transition of Care [facility]) - Progression Note    Patient Details  Name: Edward Macias MRN: [id number] Date of Birth: [birth year]  Transition of [facility] CM/SW Contact  Alfonso Rummer, LCSW Phone Number: 05/06/2024, 12:48 PM  Clinical Narrative:    Complete toc visit. Pt reports no toc needs.                     Expected Discharge Plan and Services                                               Social Drivers of Health (SDOH) Interventions SDOH Screenings   Food Insecurity: No Food Insecurity (05/01/2024)  Housing: Low Risk (05/01/2024)  Transportation Needs: No Transportation Needs (05/01/2024)  Utilities: Not At Risk (05/01/2024)  Social Connections: Socially Isolated (04/23/2024)  Tobacco Use: High Risk (05/01/2024)    Readmission Risk Interventions    05/03/2024    3:54 PM  Readmission Risk Prevention Plan  Transportation Screening Complete  PCP or Specialist Appt within 3-5 Days Complete  HRI or Home Care Consult Not Complete  HRI or Home Care Consult comments none  Social Work Consult for Recovery Care Planning/Counseling Not Complete  SW consult not completed comments n/a  Palliative Care Screening Not Applicable  Medication Review Oceanographer) Complete

## 2024-05-06 NOTE — Progress Notes (Addendum)
 " PROGRESS NOTE    Edward Macias  FMW:[id number] DOB: [birth year] DOA: 05/01/2024 PCP: Pcp, No     Brief Narrative:   Edward Macias is a 63 y.o. male with medical history significant of osteoarthritis, asthma, COPD, recurrent pneumonia and recurrent UTI who has been admitted twice recently with the same complaint of acute pyelonephritis and treated with antibiotics.  Last discharge was on the 18th.  Patient was also at [facility] where he was seen and treated.  During last hospitalization patient was seen by infectious disease.  Patient discharged on oral antibiotics but came back today with fever chills tachycardia and UTI.  Patient meets sepsis criteria again.  Previously had E. coli with septicemia and bacteremia.  Not sure if it is the same now.  Patient will be admitted for sepsis due to recurrent pyelonephritis.    Assessment & Plan:   Principal Problem:   Severe sepsis (HCC) Active Problems:   Peripheral neuropathy   History of asthma   COPD (chronic obstructive pulmonary disease) (HCC)   Tobacco abuse   Pyelonephritis   Hyponatremia   AKI (acute kidney injury)   Renal and perinephric abscess   Alcohol use disorder   Hepatitis C infection   Complicated UTI (urinary tract infection)  # Pyelonephritis # Bacteremia Presented this month with urinary frequency, fever, chills. Found to have e coli bacteremia and e coli uti b/l pyelonephritis. Pan-sensitive, discharged on 2 week course of oral levofloxacin . Presented to outside hospital a couple of days later with persistent symptoms, there CT showed continued pyelo, possible microabscesses in kidneys, levofloxacin  continued. Presents here with lack of appetite, continues to feel feel very poorly, ongoing abdominal pain. Has markedly elevated leukocytosis of around 30. No fever and hemodynamically stable. CT shows ascending uti with bilateral pyelonephritis, no abscess or other complication. Bladder scan negative for retention. WBCs improving.  Abdominal pain and distention also improving. Urine culture from admission is negative. Trace ascites and pleural effusions, too small to tap per radiology - ID following, recs appreciated - continue zosyn  per ID (as appears to have failed outpt quinolone are treating broadly, possible this is polymicrobial) - cont flomax   # AKI Baseline kidney function normal but during this illness cr has hovered around 1.7. No obstruction seen on CT and on renal u/s today. Cr stable from yesterday at 3.94, hopefully this represents nadir. Uop 650 yesterday, adequate. Likely contrast-induced nephropathy, has had three contrast-enhanced CTs within the past two weeks. - continue fluids - strict I/Os, both nursing and patient are aware - nephrology consult if we don't see improvement in kidney function over the next day or two  # Alcohol use disorder Reports history heavy drinking, withdrawal symptoms when he stops. Last drink about 3 weeks ago, no s/s withdrawal currently - cont thiamine /folate - monitor  # Hepatitis C  New diagnosis based on labs obtained at [facility] this month. Signs of cirrhosis on CT. Platelets normal, albumin low, LFTs wnl, inr mild elevation - outpt f/u, will need GI referral at d/c  # COPD No exacerbation. Arrived on 2 liters and stable on that.   - breo for home advair  - wean o2 as able  # HTN Her bp low normal - hold home amlodipine   # Constipation Resolved with lactulose  - cont miralax , prn lactulose    DVT prophylaxis: lovenox  Code Status: full Family Communication: son Edward Macias telephonically 12/26. Shared decision no update tomorrow if all stable  Level of care: Med-Surg Status is: Inpatient Remains inpatient appropriate because: severity  of illness, requiring IV antibiotics    Consultants:  ID  Procedures: none  Antimicrobials:  Cefepime  > cefazolin  > zosyn    Subjective: Appetite, abdominal pain, and abdominal distention all continue to improve.  urinating  Objective: Vitals:   05/05/24 0744 05/05/24 1941 05/06/24 0352 05/06/24 0827  BP: 109/62 115/65 125/69 119/70  Pulse: 84 91 91 83  Resp: 16 18 20 14   Temp: 98.4 F (36.9 C) 98.9 F (37.2 C) 98.7 F (37.1 C) 98.5 F (36.9 C)  TempSrc:      SpO2: 90% 98% 95% 99%  Weight:      Height:        Intake/Output Summary (Last 24 hours) at 05/06/2024 1328 Last data filed at 05/06/2024 0900 Gross per 24 hour  Intake 1751.1 ml  Output 800 ml  Net 951.1 ml   Filed Weights   05/01/24 1139  Weight: 67.6 kg    Examination:  General exam: Appears calm, ill Respiratory system: Clear to auscultation. Respiratory effort normal. Cardiovascular system: S1 & S2 heard, RRR.   Gastrointestinal system: Abdomen is mildly distended, mild tenderness improved from admission Central nervous system: Alert and oriented. No focal neurological deficits. Extremities: Symmetric 5 x 5 power. Skin: No rashes, lesions or ulcers Psychiatry: Judgement and insight appear normal. Mood & affect appropriate.     Data Reviewed: I have personally reviewed following labs and imaging studies  CBC: Recent Labs  Lab 05/01/24 1548 05/01/24 2007 05/02/24 0546 05/03/24 0448 05/04/24 0753 05/05/24 0454 05/06/24 0553  WBC 28.3*   < > 26.7* 24.4* 18.7* 16.9* 15.0*  NEUTROABS 23.6*  --   --   --   --   --   --   HGB 10.8*   < > 8.8* 9.2* 8.3* 8.0* 8.0*  HCT 31.8*   < > 25.6* 27.7* 24.1* 23.7* 23.9*  MCV 91.4   < > 90.8 91.4 89.6 91.2 90.9  PLT 634*   < > 478* 445* 393 389 363   < > = values in this interval not displayed.   Basic Metabolic Panel: Recent Labs  Lab 05/02/24 0546 05/03/24 0448 05/04/24 0753 05/05/24 0454 05/06/24 0553  NA 135 134* 132* 135 132*  K 4.0 3.7 3.8 3.6 3.7  CL 100 97* 98 98 99  CO2 25 24 25 24 23   GLUCOSE 127* 100* 109* 143* 89  BUN 21 25* 32* 39* 39*  CREATININE 1.70* 2.24* 3.02* 3.94* 3.94*  CALCIUM 8.0* 7.9* 7.4* 7.5* 7.4*   GFR: Estimated Creatinine  Clearance: 18.1 mL/min (A) (by C-G formula based on SCr of 3.94 mg/dL (H)). Liver Function Tests: Recent Labs  Lab 05/01/24 1140 05/02/24 0546 05/05/24 1421  AST 46* 31 27  ALT 39 28 14  ALKPHOS 84 90 62  BILITOT 1.3* 1.2 0.4  PROT 7.0 6.3* 6.9  ALBUMIN 2.8* 2.6* 2.8*   Recent Labs  Lab 05/01/24 1140  LIPASE 53*   No results for input(s): AMMONIA in the last 168 hours. Coagulation Profile: Recent Labs  Lab 05/01/24 1548 05/02/24 0546  INR 1.2 1.3*   Cardiac Enzymes: Recent Labs  Lab 05/05/24 1421  CKTOTAL 30*   BNP (last 3 results) Recent Labs    05/01/24 1548  PROBNP 416.0*   HbA1C: No results for input(s): HGBA1C in the last 72 hours. CBG: Recent Labs  Lab 05/02/24 2308  GLUCAP 108*   Lipid Profile: No results for input(s): CHOL, HDL, LDLCALC, TRIG, CHOLHDL, LDLDIRECT in the last 72 hours. Thyroid Function  Tests: No results for input(s): TSH, T4TOTAL, FREET4, T3FREE, THYROIDAB in the last 72 hours. Anemia Panel: No results for input(s): VITAMINB12, FOLATE, FERRITIN, TIBC, IRON, RETICCTPCT in the last 72 hours. Urine analysis:    Component Value Date/Time   COLORURINE YELLOW (A) 05/01/2024 1615   APPEARANCEUR CLOUDY (A) 05/01/2024 1615   APPEARANCEUR Clear 05/16/2020 1009   LABSPEC 1.008 05/01/2024 1615   PHURINE 6.0 05/01/2024 1615   GLUCOSEU NEGATIVE 05/01/2024 1615   HGBUR LARGE (A) 05/01/2024 1615   BILIRUBINUR NEGATIVE 05/01/2024 1615   BILIRUBINUR Negative 05/16/2020 1009   KETONESUR NEGATIVE 05/01/2024 1615   PROTEINUR 30 (A) 05/01/2024 1615   NITRITE NEGATIVE 05/01/2024 1615   LEUKOCYTESUR MODERATE (A) 05/01/2024 1615   Sepsis Labs: @LABRCNTIP (procalcitonin:4,lacticidven:4)  ) Recent Results (from the past 240 hours)  Culture, blood (routine x 2)     Status: None   Collection Time: 05/01/24 12:00 PM   Specimen: BLOOD  Result Value Ref Range Status   Specimen Description BLOOD BLOOD RIGHT ARM   Final   Special Requests   Final    BOTTLES DRAWN AEROBIC AND ANAEROBIC Blood Culture adequate volume   Culture   Final    NO GROWTH 5 DAYS Performed at [facility], [address], [place], [place] [address]    Report Status 05/06/2024 FINAL  Final  Culture, blood (routine x 2)     Status: None   Collection Time: 05/01/24 12:42 PM   Specimen: BLOOD  Result Value Ref Range Status   Specimen Description BLOOD BLOOD LEFT ARM  Final   Special Requests   Final    BOTTLES DRAWN AEROBIC AND ANAEROBIC Blood Culture adequate volume   Culture   Final    NO GROWTH 5 DAYS Performed at [facility], [address], [place], [place] [address]    Report Status 05/06/2024 FINAL  Final  Urine Culture     Status: None   Collection Time: 05/01/24  4:15 PM   Specimen: Urine, Random  Result Value Ref Range Status   Specimen Description   Final    URINE, RANDOM Performed at [facility], [address] [place], [place] [address]    Special Requests   Final    NONE Reflexed from [phone] Performed at [facility], [address] [place], [place] [address]    Culture   Final    NO GROWTH Performed at [facility], [address], [place], [place] [address]    Report Status 05/03/2024 FINAL  Final  Resp panel by RT-PCR (RSV, Flu A&B, Covid) Anterior Nasal Swab     Status: None   Collection Time: 05/01/24  5:30 PM   Specimen: Anterior Nasal Swab  Result Value Ref Range Status   SARS Coronavirus 2 by RT PCR NEGATIVE NEGATIVE Final    Comment: (NOTE) SARS-CoV-2 target nucleic acids are NOT DETECTED.  The SARS-CoV-2 RNA is generally detectable in upper respiratory specimens during the acute phase of infection. The lowest concentration of SARS-CoV-2 viral copies this assay can detect is 138 copies/mL. A negative result does not preclude SARS-Cov-2 infection and should not be used as the sole basis for treatment or other patient management decisions.  A negative result may occur with  improper specimen collection/handling, submission of specimen other than nasopharyngeal swab, presence of viral mutation(s) within the areas targeted by this assay, and inadequate number of viral copies(<138 copies/mL). A negative result must be combined with clinical observations, patient history, and epidemiological  information. The expected result is Negative.  Fact Sheet for Patients:  bloggercourse.com  Fact Sheet for Healthcare Providers:  seriousbroker.it  This test is no t yet approved or cleared by the [place]  FDA and  has been authorized for detection and/or diagnosis of SARS-CoV-2 by FDA under an Emergency Use Authorization (EUA). This EUA will remain  in effect (meaning this test can be used) for the duration of the COVID-19 declaration under Section 564(b)(1) of the Act, 21 U.S.C.section 360bbb-3(b)(1), unless the authorization is terminated  or revoked sooner.       Influenza A by PCR NEGATIVE NEGATIVE Final   Influenza B by PCR NEGATIVE NEGATIVE Final    Comment: (NOTE) The Xpert Xpress SARS-CoV-2/FLU/RSV plus assay is intended as an aid in the diagnosis of influenza from Nasopharyngeal swab specimens and should not be used as a sole basis for treatment. Nasal washings and aspirates are unacceptable for Xpert Xpress SARS-CoV-2/FLU/RSV testing.  Fact Sheet for Patients: bloggercourse.com  Fact Sheet for Healthcare Providers: seriousbroker.it  This test is not yet approved or cleared by the [place]  FDA and has been authorized for detection and/or diagnosis of SARS-CoV-2 by FDA under an Emergency Use Authorization (EUA). This EUA will remain in effect (meaning this test can be used) for the duration of the COVID-19 declaration under Section 564(b)(1) of the Act, 21 U.S.C. section 360bbb-3(b)(1), unless the authorization  is terminated or revoked.     Resp Syncytial Virus by PCR NEGATIVE NEGATIVE Final    Comment: (NOTE) Fact Sheet for Patients: bloggercourse.com  Fact Sheet for Healthcare Providers: seriousbroker.it  This test is not yet approved or cleared by the [place]  FDA and has been authorized for detection and/or diagnosis of SARS-CoV-2 by FDA under an Emergency Use Authorization (EUA). This EUA will remain in effect (meaning this test can be used) for the duration of the COVID-19 declaration under Section 564(b)(1) of the Act, 21 U.S.C. section 360bbb-3(b)(1), unless the authorization is terminated or revoked.  Performed at [facility], [address] [place], [place] [address]          Radiology Studies: US  RENAL Result Date: 05/05/2024 EXAM: US  Retroperitoneum Complete, Renal. 05/05/2024 10:25:47 AM TECHNIQUE: Real-time ultrasonography of the retroperitoneum renal was performed. COMPARISON: None available CLINICAL HISTORY: AKI (acute kidney injury). FINDINGS: FINDINGS: RIGHT KIDNEY/URETER: Right kidney measures 12.3 x 5.1 x 6.2 cm. Normal cortical echogenicity. No hydronephrosis. No calculus. No mass. LEFT KIDNEY/URETER: Left kidney measures 11.1 x 6.9 x 5.6 cm. Normal cortical echogenicity. No hydronephrosis. No calculus. No mass. BLADDER: Circumferential wall thickening of the urinary bladder, which may be due to underdistention. If there is concern for acute cystitis, correlation with urinalysis would be recommended. PLEURAL SPACES: Right pleural effusion. IMPRESSION: 1. Circumferential wall thickening of the urinary bladder, which may be due to underdistention .if there is concern for acute cystitis, correlation with urinalysis would be recommended. 2. No hydronephrosis or nephrolithiasis. 3. Right pleural effusion. Electronically signed by: Rogelia Myers MD 05/05/2024 02:10 PM EST RP Workstation: [id number]          Scheduled Meds:  fluticasone  furoate-vilanterol  1 puff Inhalation Daily   folic acid   1 mg Oral Daily   heparin   5,000 Units Subcutaneous Q8H   loratadine   10 mg Oral Daily   tamsulosin   0.4 mg Oral QPC supper   thiamine   100 mg Oral Daily   Continuous Infusions:  sodium chloride  100 mL/hr at 05/05/24 1515   piperacillin -tazobactam (ZOSYN )  IV 2.25  g (05/06/24 1328)     LOS: 5 days     Devaughn KATHEE Ban, MD [facility]   If 7PM-7AM, please contact night-coverage [email or web address] Password [email or web address] 05/06/2024, 1:28 PM     "

## 2024-05-06 NOTE — Progress Notes (Signed)
 "  Date of Admission:  05/01/2024      ID: Edward Macias is a 63 y.o. male Principal Problem:   Severe sepsis (HCC) Active Problems:   Peripheral neuropathy   History of asthma   COPD (chronic obstructive pulmonary disease) (HCC)   Tobacco abuse   Pyelonephritis   Hyponatremia   AKI (acute kidney injury)   Renal and perinephric abscess   Alcohol use disorder   Hepatitis C infection   Complicated UTI (urinary tract infection)    Subjective: Feeling better No abdominal pain or bloating  Medications:   fluticasone  furoate-vilanterol  1 puff Inhalation Daily   folic acid   1 mg Oral Daily   heparin   5,000 Units Subcutaneous Q8H   loratadine   10 mg Oral Daily   tamsulosin   0.4 mg Oral QPC supper   thiamine   100 mg Oral Daily    Objective: Vital signs in last 24 hours: Patient Vitals for the past 24 hrs:  BP Temp Pulse Resp SpO2  05/06/24 0827 119/70 98.5 F (36.9 C) 83 14 99 %  05/06/24 0352 125/69 98.7 F (37.1 C) 91 20 95 %  05/05/24 1941 115/65 98.9 F (37.2 C) 91 18 98 %      PHYSICAL EXAM:  General: Alert, cooperative, no distress, appears stated age.  Lungs:b/l air entry Heart: Regular rate and rhythm, no murmur, rub or gallop. Abdomen: Soft, some tenderness to palpation Extremities: atraumatic, no cyanosis. No edema. No clubbing Skin: No rashes or lesions. Or bruising Lymph: Cervical, supraclavicular normal. Neurologic: Grossly non-focal  Lab Results    Latest Ref Rng & Units 05/06/2024    5:53 AM 05/05/2024    4:54 AM 05/04/2024    7:53 AM  CBC  WBC 4.0 - 10.5 K/uL 15.0  16.9  18.7   Hemoglobin 13.0 - 17.0 g/dL 8.0  8.0  8.3   Hematocrit 39.0 - 52.0 % 23.9  23.7  24.1   Platelets 150 - 400 K/uL 363  389  393        Latest Ref Rng & Units 05/06/2024    5:53 AM 05/05/2024    2:21 PM 05/05/2024    4:54 AM  CMP  Glucose 70 - 99 mg/dL 89   856   BUN 8 - 23 mg/dL 39   39   Creatinine 9.38 - 1.24 mg/dL 6.05   6.05   Sodium 864 - 145 mmol/L 132    135   Potassium 3.5 - 5.1 mmol/L 3.7   3.6   Chloride 98 - 111 mmol/L 99   98   CO2 22 - 32 mmol/L 23   24   Calcium 8.9 - 10.3 mg/dL 7.4   7.5   Total Protein 6.5 - 8.1 g/dL  6.9    Total Bilirubin 0.0 - 1.2 mg/dL  0.4    Alkaline Phos 38 - 126 U/L  62    AST 15 - 41 U/L  27    ALT 0 - 44 U/L  14        Microbiology: 04/23/24 BC- 2/2 sets pan sensitive Ecoli 04/23/24 UC Ecoli 05/01/24 BC- NG UC- NG Studies/Results: CT abdomen and pelvis with contrast shows mild irregularity of the liver contour suspicious for cirrhosis Heterogeneous nephrogram bilaterally consistent with pyelonephritis No abscess Enhancement of the urothelium of the ureters consistent with ascending UTI. The prostate and seminal vesicles are grossly unremarkable  Assessment/Plan: 63 year old male with history for COPD with hospitalization in October and November for COPD exacerbation  now been in and out of hospital since 04/23/2024 for complicated urinary tract infection with E. coli.  Had E. coli bacteremia on 04/23/2024 Pansensitive E. coli He was placed initially on ceftriaxone  and then on p.o. Levaquin  but has been in and out of [facility] and [facility] since then.  The leukocytosis has been slow to respond He is started on Zosyn  this admission Leukocytosis has declined from 30.9-15. This is a pansensitive E. coli.  Patient noted to have complicated pyelonephritis with questionable microabscesses in the right lower lobe wall area so the plan was to give him at least 2 to 3 weeks of antibiotics Now that the leukocytosis is declining, we will be able to switch him back to Levaquin .  Duration for 2 weeks until 05/16/24  Hepatitis C active- will have to see GI as OP for treatment Cirrhosis by imaging  ETOH abuse  AKI-worsening creatinine could be related to the contrast he has received with the CAT scans he has had 3 CTs with contrast in the last 2 weeks Monitor closely PVR not significant   COPD  Discussed the  management with the patient and with Dr. kandis   "

## 2024-05-07 ENCOUNTER — Inpatient Hospital Stay

## 2024-05-07 LAB — CBC
HCT: 25.2 % — ABNORMAL LOW (ref 39.0–52.0)
Hemoglobin: 8.3 g/dL — ABNORMAL LOW (ref 13.0–17.0)
MCH: 30 pg (ref 26.0–34.0)
MCHC: 32.9 g/dL (ref 30.0–36.0)
MCV: 91 fL (ref 80.0–100.0)
Platelets: 366 K/uL (ref 150–400)
RBC: 2.77 MIL/uL — ABNORMAL LOW (ref 4.22–5.81)
RDW: 16.3 % — ABNORMAL HIGH (ref 11.5–15.5)
WBC: 13.7 K/uL — ABNORMAL HIGH (ref 4.0–10.5)
nRBC: 0 % (ref 0.0–0.2)

## 2024-05-07 LAB — BASIC METABOLIC PANEL WITH GFR
Anion gap: 11 (ref 5–15)
BUN: 41 mg/dL — ABNORMAL HIGH (ref 8–23)
CO2: 21 mmol/L — ABNORMAL LOW (ref 22–32)
Calcium: 7.5 mg/dL — ABNORMAL LOW (ref 8.9–10.3)
Chloride: 101 mmol/L (ref 98–111)
Creatinine, Ser: 3.77 mg/dL — ABNORMAL HIGH (ref 0.61–1.24)
GFR, Estimated: 17 mL/min — ABNORMAL LOW
Glucose, Bld: 95 mg/dL (ref 70–99)
Potassium: 3.8 mmol/L (ref 3.5–5.1)
Sodium: 133 mmol/L — ABNORMAL LOW (ref 135–145)

## 2024-05-07 MED ORDER — SODIUM CHLORIDE 0.9 % IV SOLN: INTRAVENOUS | Status: DC

## 2024-05-07 NOTE — Plan of Care (Signed)
  Problem: Fluid Volume: Goal: Hemodynamic stability will improve Outcome: Progressing   Problem: Clinical Measurements: Goal: Diagnostic test results will improve Outcome: Progressing Goal: Signs and symptoms of infection will decrease Outcome: Progressing   Problem: Respiratory: Goal: Ability to maintain adequate ventilation will improve Outcome: Progressing   Problem: Education: Goal: Knowledge of General Education information will improve Description: Including pain rating scale, medication(s)/side effects and non-pharmacologic comfort measures Outcome: Progressing   Problem: Health Behavior/Discharge Planning: Goal: Ability to manage health-related needs will improve Outcome: Progressing   Problem: Clinical Measurements: Goal: Ability to maintain clinical measurements within normal limits will improve Outcome: Progressing Goal: Will remain free from infection Outcome: Progressing Goal: Diagnostic test results will improve Outcome: Progressing Goal: Respiratory complications will improve Outcome: Progressing Goal: Cardiovascular complication will be avoided Outcome: Progressing   Problem: Activity: Goal: Risk for activity intolerance will decrease Outcome: Progressing   Problem: Nutrition: Goal: Adequate nutrition will be maintained Outcome: Progressing   Problem: Coping: Goal: Level of anxiety will decrease Outcome: Progressing   Problem: Elimination: Goal: Will not experience complications related to bowel motility Outcome: Progressing Goal: Will not experience complications related to urinary retention Outcome: Progressing   Problem: Pain Managment: Goal: General experience of comfort will improve and/or be controlled Outcome: Progressing   Problem: Safety: Goal: Ability to remain free from injury will improve Outcome: Progressing   Problem: Skin Integrity: Goal: Risk for impaired skin integrity will decrease Outcome: Progressing   Problem:  Education: Goal: Knowledge of General Education information will improve Description: Including pain rating scale, medication(s)/side effects and non-pharmacologic comfort measures Outcome: Progressing   Problem: Health Behavior/Discharge Planning: Goal: Ability to manage health-related needs will improve Outcome: Progressing   Problem: Clinical Measurements: Goal: Ability to maintain clinical measurements within normal limits will improve Outcome: Progressing Goal: Will remain free from infection Outcome: Progressing Goal: Diagnostic test results will improve Outcome: Progressing Goal: Respiratory complications will improve Outcome: Progressing Goal: Cardiovascular complication will be avoided Outcome: Progressing   Problem: Activity: Goal: Risk for activity intolerance will decrease Outcome: Progressing   Problem: Nutrition: Goal: Adequate nutrition will be maintained Outcome: Progressing   Problem: Coping: Goal: Level of anxiety will decrease Outcome: Progressing   Problem: Elimination: Goal: Will not experience complications related to bowel motility Outcome: Progressing Goal: Will not experience complications related to urinary retention Outcome: Progressing   Problem: Pain Managment: Goal: General experience of comfort will improve and/or be controlled Outcome: Progressing   Problem: Safety: Goal: Ability to remain free from injury will improve Outcome: Progressing   Problem: Skin Integrity: Goal: Risk for impaired skin integrity will decrease Outcome: Progressing

## 2024-05-07 NOTE — Progress Notes (Signed)
 " PROGRESS NOTE    Edward Macias  FMW:[id number] DOB: December 16, 1960 DOA: 05/01/2024 PCP: Pcp, No     Brief Narrative:   Edward Macias is a 63 y.o. male with medical history significant of osteoarthritis, asthma, COPD, recurrent pneumonia and recurrent UTI who has been admitted twice recently with the same complaint of acute pyelonephritis and treated with antibiotics.  Last discharge was on the 18th.  Patient was also at [facility] where he was seen and treated.  During last hospitalization patient was seen by infectious disease.  Patient discharged on oral antibiotics but came back today with fever chills tachycardia and UTI.  Patient meets sepsis criteria again.  Previously had E. coli with septicemia and bacteremia.  Not sure if it is the same now.  Patient will be admitted for sepsis due to recurrent pyelonephritis.    Assessment & Plan:   Principal Problem:   Severe sepsis (HCC) Active Problems:   Peripheral neuropathy   History of asthma   COPD (chronic obstructive pulmonary disease) (HCC)   Tobacco abuse   Pyelonephritis   Hyponatremia   AKI (acute kidney injury)   Renal and perinephric abscess   Alcohol use disorder   Hepatitis C infection   Complicated UTI (urinary tract infection)  # Pyelonephritis # Bacteremia Presented this month with urinary frequency, fever, chills. Found to have e coli bacteremia and e coli uti b/l pyelonephritis. Pan-sensitive, discharged on 2 week course of oral levofloxacin . Presented to outside hospital a couple of days later with persistent symptoms, there CT showed continued pyelo, possible microabscesses in kidneys, levofloxacin  continued. Presents here with lack of appetite, continues to feel feel very poorly, ongoing abdominal pain. Has markedly elevated leukocytosis of around 30. No fever and hemodynamically stable. CT shows ascending uti with bilateral pyelonephritis, no abscess or other complication. Bladder scan negative for retention. WBCs improving.  Abdominal pain and distention also improving. Urine culture from admission is negative. Trace ascites and pleural effusions, too small to tap per radiology - ID following, recs appreciated - treated with zosyn  here, transitioned to levofloxacin  on 12/26 - cont flomax   # AKI Baseline kidney function normal but during this illness cr has hovered around 1.7. No obstruction seen on CT and on renal u/s today. Cr slight improvement today, 3.94>3.77.  Uop 600 yesterday, adequate. Likely contrast-induced nephropathy, has had three contrast-enhanced CTs within the past two weeks. - continue gentle fluids - strict I/Os, both nursing and patient are aware - nephrology consult if we don't see improvement in kidney function over the next day or two  # Alcohol use disorder Reports history heavy drinking, withdrawal symptoms when he stops. Last drink about 3 weeks ago, no s/s withdrawal currently. Continues to complain of abdominal distention. Mild ascites seen on CT - cont thiamine /folate - will check u/s to eval for development of ascites - monitor  # Hepatitis C  New diagnosis based on labs obtained at [facility] this month. Signs of cirrhosis on CT. Platelets normal, albumin low, LFTs wnl, inr mild elevation - outpt f/u, will need GI referral at d/c  # COPD No exacerbation. Arrived on 2 liters and stable on that.   - breo for home advair  - wean o2 as able  # HTN Her bp low normal - hold home amlodipine   # Constipation Resolved with lactulose  - cont miralax , prn lactulose    DVT prophylaxis: lovenox  Code Status: full Family Communication: son Edward Macias telephonically 12/26. Shared decision no update tomorrow if all stable  Level of care:  Med-Surg Status is: Inpatient Remains inpatient appropriate because: severity of illness, requiring IV antibiotics    Consultants:  ID  Procedures: none  Antimicrobials:  Cefepime  > cefazolin  > zosyn  > levofloxacin    Subjective: Endorses abdominal  distention but no pain. Tolerating diet  Objective: Vitals:   05/06/24 1611 05/06/24 1957 05/07/24 0441 05/07/24 0835  BP: 113/64 122/68 127/73 123/74  Pulse: 91 91 85 79  Resp: 16 18 16 16   Temp: 99 F (37.2 C) 99 F (37.2 C) 98 F (36.7 C) 98.5 F (36.9 C)  TempSrc:   Oral   SpO2: 99% 100% 99% 100%  Weight:      Height:        Intake/Output Summary (Last 24 hours) at 05/07/2024 1531 Last data filed at 05/07/2024 0900 Gross per 24 hour  Intake 1861.52 ml  Output 450 ml  Net 1411.52 ml   Filed Weights   05/01/24 1139  Weight: 67.6 kg    Examination:  General exam: Appears calm, ill Respiratory system: Clear to auscultation. Respiratory effort normal. Cardiovascular system: S1 & S2 heard, RRR.   Gastrointestinal system: Abdomen is mildly distended, mild tenderness improved from admission Central nervous system: Alert and oriented. No focal neurological deficits. Extremities: Symmetric 5 x 5 power. Skin: No rashes, lesions or ulcers Psychiatry: Judgement and insight appear normal. Mood & affect appropriate.     Data Reviewed: I have personally reviewed following labs and imaging studies  CBC: Recent Labs  Lab 05/01/24 1548 05/01/24 2007 05/03/24 0448 05/04/24 0753 05/05/24 0454 05/06/24 0553 05/07/24 0834  WBC 28.3*   < > 24.4* 18.7* 16.9* 15.0* 13.7*  NEUTROABS 23.6*  --   --   --   --   --   --   HGB 10.8*   < > 9.2* 8.3* 8.0* 8.0* 8.3*  HCT 31.8*   < > 27.7* 24.1* 23.7* 23.9* 25.2*  MCV 91.4   < > 91.4 89.6 91.2 90.9 91.0  PLT 634*   < > 445* 393 389 363 366   < > = values in this interval not displayed.   Basic Metabolic Panel: Recent Labs  Lab 05/03/24 0448 05/04/24 0753 05/05/24 0454 05/06/24 0553 05/07/24 0834  NA 134* 132* 135 132* 133*  K 3.7 3.8 3.6 3.7 3.8  CL 97* 98 98 99 101  CO2 24 25 24 23  21*  GLUCOSE 100* 109* 143* 89 95  BUN 25* 32* 39* 39* 41*  CREATININE 2.24* 3.02* 3.94* 3.94* 3.77*  CALCIUM 7.9* 7.4* 7.5* 7.4* 7.5*    GFR: Estimated Creatinine Clearance: 18.9 mL/min (A) (by C-G formula based on SCr of 3.77 mg/dL (H)). Liver Function Tests: Recent Labs  Lab 05/01/24 1140 05/02/24 0546 05/05/24 1421  AST 46* 31 27  ALT 39 28 14  ALKPHOS 84 90 62  BILITOT 1.3* 1.2 0.4  PROT 7.0 6.3* 6.9  ALBUMIN 2.8* 2.6* 2.8*   Recent Labs  Lab 05/01/24 1140  LIPASE 53*   No results for input(s): AMMONIA in the last 168 hours. Coagulation Profile: Recent Labs  Lab 05/01/24 1548 05/02/24 0546  INR 1.2 1.3*   Cardiac Enzymes: Recent Labs  Lab 05/05/24 1421  CKTOTAL 30*   BNP (last 3 results) Recent Labs    05/01/24 1548  PROBNP 416.0*   HbA1C: No results for input(s): HGBA1C in the last 72 hours. CBG: Recent Labs  Lab 05/02/24 2308  GLUCAP 108*   Lipid Profile: No results for input(s): CHOL, HDL, LDLCALC, TRIG, CHOLHDL,  LDLDIRECT in the last 72 hours. Thyroid Function Tests: No results for input(s): TSH, T4TOTAL, FREET4, T3FREE, THYROIDAB in the last 72 hours. Anemia Panel: No results for input(s): VITAMINB12, FOLATE, FERRITIN, TIBC, IRON, RETICCTPCT in the last 72 hours. Urine analysis:    Component Value Date/Time   COLORURINE YELLOW (A) 05/01/2024 1615   APPEARANCEUR CLOUDY (A) 05/01/2024 1615   APPEARANCEUR Clear 05/16/2020 1009   LABSPEC 1.008 05/01/2024 1615   PHURINE 6.0 05/01/2024 1615   GLUCOSEU NEGATIVE 05/01/2024 1615   HGBUR LARGE (A) 05/01/2024 1615   BILIRUBINUR NEGATIVE 05/01/2024 1615   BILIRUBINUR Negative 05/16/2020 1009   KETONESUR NEGATIVE 05/01/2024 1615   PROTEINUR 30 (A) 05/01/2024 1615   NITRITE NEGATIVE 05/01/2024 1615   LEUKOCYTESUR MODERATE (A) 05/01/2024 1615   Sepsis Labs: @LABRCNTIP (procalcitonin:4,lacticidven:4)  ) Recent Results (from the past 240 hours)  Culture, blood (routine x 2)     Status: None   Collection Time: 05/01/24 12:00 PM   Specimen: BLOOD  Result Value Ref Range Status   Specimen  Description BLOOD BLOOD RIGHT ARM  Final   Special Requests   Final    BOTTLES DRAWN AEROBIC AND ANAEROBIC Blood Culture adequate volume   Culture   Final    NO GROWTH 5 DAYS Performed at [facility], [address], [place], [place] [address]    Report Status 05/06/2024 FINAL  Final  Culture, blood (routine x 2)     Status: None   Collection Time: 05/01/24 12:42 PM   Specimen: BLOOD  Result Value Ref Range Status   Specimen Description BLOOD BLOOD LEFT ARM  Final   Special Requests   Final    BOTTLES DRAWN AEROBIC AND ANAEROBIC Blood Culture adequate volume   Culture   Final    NO GROWTH 5 DAYS Performed at [facility], [address], [place], [place] [address]    Report Status 05/06/2024 FINAL  Final  Urine Culture     Status: None   Collection Time: 05/01/24  4:15 PM   Specimen: Urine, Random  Result Value Ref Range Status   Specimen Description   Final    URINE, RANDOM Performed at [facility], [address] [place], [place] [address]    Special Requests   Final    NONE Reflexed from [id number] Performed at [facility], [address] [place], [place] [address]    Culture   Final    NO GROWTH Performed at [facility], [address] [place], [place] [address]    Report Status 05/03/2024 FINAL  Final  Resp panel by RT-PCR (RSV, Flu A&B, Covid) Anterior Nasal Swab     Status: None   Collection Time: 05/01/24  5:30 PM   Specimen: Anterior Nasal Swab  Result Value Ref Range Status   SARS Coronavirus 2 by RT PCR NEGATIVE NEGATIVE Final    Comment: (NOTE) SARS-CoV-2 target nucleic acids are NOT DETECTED.  The SARS-CoV-2 RNA is generally detectable in upper respiratory specimens during the acute phase of infection. The lowest concentration of SARS-CoV-2 viral copies this assay can detect is 138 copies/mL. A negative result does not preclude SARS-Cov-2 infection and should not be used as the sole basis for treatment  or other patient management decisions. A negative result may occur with  improper specimen collection/handling, submission of specimen other than nasopharyngeal swab, presence of viral mutation(s) within the areas targeted by this assay, and inadequate number of viral copies(<138 copies/mL). A negative result must be  combined with clinical observations, patient history, and epidemiological information. The expected result is Negative.  Fact Sheet for Patients:  bloggercourse.com  Fact Sheet for Healthcare Providers:  seriousbroker.it  This test is no t yet approved or cleared by the [place]  FDA and  has been authorized for detection and/or diagnosis of SARS-CoV-2 by FDA under an Emergency Use Authorization (EUA). This EUA will remain  in effect (meaning this test can be used) for the duration of the COVID-19 declaration under Section 564(b)(1) of the Act, 21 U.S.C.section 360bbb-3(b)(1), unless the authorization is terminated  or revoked sooner.       Influenza A by PCR NEGATIVE NEGATIVE Final   Influenza B by PCR NEGATIVE NEGATIVE Final    Comment: (NOTE) The Xpert Xpress SARS-CoV-2/FLU/RSV plus assay is intended as an aid in the diagnosis of influenza from Nasopharyngeal swab specimens and should not be used as a sole basis for treatment. Nasal washings and aspirates are unacceptable for Xpert Xpress SARS-CoV-2/FLU/RSV testing.  Fact Sheet for Patients: bloggercourse.com  Fact Sheet for Healthcare Providers: seriousbroker.it  This test is not yet approved or cleared by the [place]  FDA and has been authorized for detection and/or diagnosis of SARS-CoV-2 by FDA under an Emergency Use Authorization (EUA). This EUA will remain in effect (meaning this test can be used) for the duration of the COVID-19 declaration under Section 564(b)(1) of the Act, 21 U.S.C. section  360bbb-3(b)(1), unless the authorization is terminated or revoked.     Resp Syncytial Virus by PCR NEGATIVE NEGATIVE Final    Comment: (NOTE) Fact Sheet for Patients: bloggercourse.com  Fact Sheet for Healthcare Providers: seriousbroker.it  This test is not yet approved or cleared by the [place]  FDA and has been authorized for detection and/or diagnosis of SARS-CoV-2 by FDA under an Emergency Use Authorization (EUA). This EUA will remain in effect (meaning this test can be used) for the duration of the COVID-19 declaration under Section 564(b)(1) of the Act, 21 U.S.C. section 360bbb-3(b)(1), unless the authorization is terminated or revoked.  Performed at [facility], [address] [place], [place] [address]          Radiology Studies: No results found.        Scheduled Meds:  fluticasone  furoate-vilanterol  1 puff Inhalation Daily   folic acid   1 mg Oral Daily   heparin   5,000 Units Subcutaneous Q8H   [START ON 05/08/2024] levofloxacin   500 mg Oral Q48H   loratadine   10 mg Oral Daily   tamsulosin   0.4 mg Oral QPC supper   thiamine   100 mg Oral Daily   Continuous Infusions:  sodium chloride  75 mL/hr at 05/07/24 1513     LOS: 6 days     Devaughn KATHEE Ban, MD [facility]   If 7PM-7AM, please contact night-coverage [email or web address] Password [facility] 05/07/2024, 3:31 PM     "

## 2024-05-07 NOTE — Plan of Care (Signed)

## 2024-05-08 DIAGNOSIS — R7881 Bacteremia: Secondary | ICD-10-CM | POA: Diagnosis not present

## 2024-05-08 LAB — BASIC METABOLIC PANEL WITH GFR
Anion gap: 10 (ref 5–15)
BUN: 42 mg/dL — ABNORMAL HIGH (ref 8–23)
CO2: 22 mmol/L (ref 22–32)
Calcium: 7.6 mg/dL — ABNORMAL LOW (ref 8.9–10.3)
Chloride: 104 mmol/L (ref 98–111)
Creatinine, Ser: 3.85 mg/dL — ABNORMAL HIGH (ref 0.61–1.24)
GFR, Estimated: 17 mL/min — ABNORMAL LOW
Glucose, Bld: 106 mg/dL — ABNORMAL HIGH (ref 70–99)
Potassium: 4.1 mmol/L (ref 3.5–5.1)
Sodium: 135 mmol/L (ref 135–145)

## 2024-05-08 LAB — CBC
HCT: 24.4 % — ABNORMAL LOW (ref 39.0–52.0)
Hemoglobin: 8.2 g/dL — ABNORMAL LOW (ref 13.0–17.0)
MCH: 30.4 pg (ref 26.0–34.0)
MCHC: 33.6 g/dL (ref 30.0–36.0)
MCV: 90.4 fL (ref 80.0–100.0)
Platelets: 391 K/uL (ref 150–400)
RBC: 2.7 MIL/uL — ABNORMAL LOW (ref 4.22–5.81)
RDW: 16.4 % — ABNORMAL HIGH (ref 11.5–15.5)
WBC: 13.8 K/uL — ABNORMAL HIGH (ref 4.0–10.5)
nRBC: 0 % (ref 0.0–0.2)

## 2024-05-08 MED ORDER — MORPHINE SULFATE (PF) 2 MG/ML IV SOLN
2.0000 mg | INTRAVENOUS | Status: DC | PRN
  Administered 2024-05-08 – 2024-05-11 (×4): 2 mg via INTRAVENOUS
  Filled 2024-05-08: qty 1

## 2024-05-08 MED ORDER — SIMETHICONE 80 MG PO CHEW
80.0000 mg | CHEWABLE_TABLET | Freq: Four times a day (QID) | ORAL | Status: DC | PRN
  Administered 2024-05-08: 80 mg via ORAL
  Filled 2024-05-08: qty 1

## 2024-05-08 MED ORDER — FUROSEMIDE 10 MG/ML IJ SOLN
40.0000 mg | Freq: Every day | INTRAMUSCULAR | Status: DC
  Administered 2024-05-08 – 2024-05-09 (×2): 40 mg via INTRAVENOUS
  Filled 2024-05-08: qty 4

## 2024-05-08 NOTE — Progress Notes (Incomplete)
 Pt c/o frontal constant headache 10/10, oxycodone  given at 1951, no improvements in symptoms.  He also c/o being bloated. Mild ascites seen on CT but no ascites seen on US  so unable to do a paracentesis, getting lasix . Pt had BM today. Abdomen mildly distended, but soft. Not tender to touch.  Pt neurologically intact New orders placed.

## 2024-05-08 NOTE — Progress Notes (Signed)
 " PROGRESS NOTE    Edward Odriscoll  FMW:[id number] DOB: 23-Oct-1960 DOA: 05/01/2024 PCP: Pcp, No    Assessment & Plan:   Principal Problem:   Severe sepsis (HCC) Active Problems:   Peripheral neuropathy   History of asthma   COPD (chronic obstructive pulmonary disease) (HCC)   Tobacco abuse   Pyelonephritis   Hyponatremia   AKI (acute kidney injury)   Renal and perinephric abscess   Alcohol use disorder   Hepatitis C infection   Complicated UTI (urinary tract infection)  Assessment and Plan: Bacteremia: blood cxs grew e.coli. Likely secondary to e.coli UTI/pyelonephritis. Pan-sensitive, discharged on 2 week course of oral levofloxacin . Presented to outside hospital a couple of days later with persistent symptoms, there CT showed continued pyelo, possible microabscesses in kidneys, levofloxacin  continued. Presents here with lack of appetite, continues to feel feel very poorly, ongoing abdominal pain. CT shows ascending UTI with bilateral pyelonephritis, no abscess or other complication. Bladder scan negative for retention. Repeat blood cxs NGTD. Urine culture is negative. Continue on levaquin     AKI: Cr is labile. Recently had CTs w/ contrast.    Alcohol use disorder: reports history heavy drinking, withdrawal symptoms when he stops. Last drink about 3 weeks ago, no s/s withdrawal currently. Mild ascites seen on CT but no ascites seen on US  so unable to do a paracentesis.    Hepatitis C: new dx  based on labs obtained at [facility] this month. Signs of cirrhosis on CT. Will need to f/u outpatient w/ GI  Likely alcoholic cirrhosis: not drank alcohol in 3 weeks as per pt. Lactulose  prn. Started on lasix  for edema but will monitor secondary to AKI.    COPD: w/o exacerbation. Bronchodilators prn    HTN: holding home amlodipine  as BP WNL currently    Constipation: resolved w/ lactulose        DVT prophylaxis: heparin   Code Status:  full  Family Communication: Disposition Plan: likely  d/c back home   Level of care: Med-Surg  Status is: Inpatient Remains inpatient appropriate because: severity of illness    Consultants:    Procedures:   Antimicrobials: levaquin   Subjective: Pt c/o abd distention  Objective: Vitals:   05/07/24 0835 05/07/24 2027 05/08/24 0437 05/08/24 0809  BP: 123/74 109/69 121/75 130/78  Pulse: 79 88 81 83  Resp: 16 18 18 18   Temp: 98.5 F (36.9 C) 98.3 F (36.8 C) 98 F (36.7 C) 97.9 F (36.6 C)  TempSrc:      SpO2: 100% 99% 100% 100%  Weight:      Height:        Intake/Output Summary (Last 24 hours) at 05/08/2024 1032 Last data filed at 05/08/2024 0610 Gross per 24 hour  Intake 822.11 ml  Output 750 ml  Net 72.11 ml   Filed Weights   05/01/24 1139  Weight: 67.6 kg    Examination:  General exam: Appears calm and comfortable  Respiratory system: Clear to auscultation. Respiratory effort normal. Cardiovascular system: S1 & S2 +. No rubs, gallops or clicks. +1 pitting edema of b/l LE  Gastrointestinal system: Abdomen is soft and tender to palpation. Hypoactive  bowel sounds heard. Central nervous system: Alert and oriented. Moves all extremities Psychiatry: Judgement and insight appears at baseline. Flat mood and affect    Data Reviewed: I have personally reviewed following labs and imaging studies  CBC: Recent Labs  Lab 05/01/24 1548 05/01/24 2007 05/04/24 0753 05/05/24 0454 05/06/24 0553 05/07/24 0834 05/08/24 0427  WBC 28.3*   < >  18.7* 16.9* 15.0* 13.7* 13.8*  NEUTROABS 23.6*  --   --   --   --   --   --   HGB 10.8*   < > 8.3* 8.0* 8.0* 8.3* 8.2*  HCT 31.8*   < > 24.1* 23.7* 23.9* 25.2* 24.4*  MCV 91.4   < > 89.6 91.2 90.9 91.0 90.4  PLT 634*   < > 393 389 363 366 391   < > = values in this interval not displayed.   Basic Metabolic Panel: Recent Labs  Lab 05/04/24 0753 05/05/24 0454 05/06/24 0553 05/07/24 0834 05/08/24 0427  NA 132* 135 132* 133* 135  K 3.8 3.6 3.7 3.8 4.1  CL 98 98 99 101  104  CO2 25 24 23  21* 22  GLUCOSE 109* 143* 89 95 106*  BUN 32* 39* 39* 41* 42*  CREATININE 3.02* 3.94* 3.94* 3.77* 3.85*  CALCIUM 7.4* 7.5* 7.4* 7.5* 7.6*   GFR: Estimated Creatinine Clearance: 18.5 mL/min (A) (by C-G formula based on SCr of 3.85 mg/dL (H)). Liver Function Tests: Recent Labs  Lab 05/01/24 1140 05/02/24 0546 05/05/24 1421  AST 46* 31 27  ALT 39 28 14  ALKPHOS 84 90 62  BILITOT 1.3* 1.2 0.4  PROT 7.0 6.3* 6.9  ALBUMIN 2.8* 2.6* 2.8*   Recent Labs  Lab 05/01/24 1140  LIPASE 53*   No results for input(s): AMMONIA in the last 168 hours. Coagulation Profile: Recent Labs  Lab 05/01/24 1548 05/02/24 0546  INR 1.2 1.3*   Cardiac Enzymes: Recent Labs  Lab 05/05/24 1421  CKTOTAL 30*   BNP (last 3 results) Recent Labs    05/01/24 1548  PROBNP 416.0*   HbA1C: No results for input(s): HGBA1C in the last 72 hours. CBG: Recent Labs  Lab 05/02/24 2308  GLUCAP 108*   Lipid Profile: No results for input(s): CHOL, HDL, LDLCALC, TRIG, CHOLHDL, LDLDIRECT in the last 72 hours. Thyroid Function Tests: No results for input(s): TSH, T4TOTAL, FREET4, T3FREE, THYROIDAB in the last 72 hours. Anemia Panel: No results for input(s): VITAMINB12, FOLATE, FERRITIN, TIBC, IRON, RETICCTPCT in the last 72 hours. Sepsis Labs: Recent Labs  Lab 05/01/24 1615 05/01/24 2007  LATICACIDVEN 1.1 1.9    Recent Results (from the past 240 hours)  Culture, blood (routine x 2)     Status: None   Collection Time: 05/01/24 12:00 PM   Specimen: BLOOD  Result Value Ref Range Status   Specimen Description BLOOD BLOOD RIGHT ARM  Final   Special Requests   Final    BOTTLES DRAWN AEROBIC AND ANAEROBIC Blood Culture adequate volume   Culture   Final    NO GROWTH 5 DAYS Performed at [facility], [address] [place], [place] [address]    Report Status 05/06/2024 FINAL  Final  Culture, blood (routine x 2)     Status: None    Collection Time: 05/01/24 12:42 PM   Specimen: BLOOD  Result Value Ref Range Status   Specimen Description BLOOD BLOOD LEFT ARM  Final   Special Requests   Final    BOTTLES DRAWN AEROBIC AND ANAEROBIC Blood Culture adequate volume   Culture   Final    NO GROWTH 5 DAYS Performed at [facility], [address] [place], [place] [address]    Report Status 05/06/2024 FINAL  Final  Urine Culture     Status: None   Collection Time: 05/01/24  4:15 PM   Specimen: Urine, Random  Result Value Ref  Range Status   Specimen Description   Final    URINE, RANDOM Performed at [facility], [address] [place], [place] [address]    Special Requests   Final    NONE Reflexed from [id number] Performed at [facility], [address] [place] [place], [place] [address]    Culture   Final    NO GROWTH Performed at [facility], [address] [place], [place] [address]    Report Status 05/03/2024 FINAL  Final  Resp panel by RT-PCR (RSV, Flu A&B, Covid) Anterior Nasal Swab     Status: None   Collection Time: 05/01/24  5:30 PM   Specimen: Anterior Nasal Swab  Result Value Ref Range Status   SARS Coronavirus 2 by RT PCR NEGATIVE NEGATIVE Final    Comment: (NOTE) SARS-CoV-2 target nucleic acids are NOT DETECTED.  The SARS-CoV-2 RNA is generally detectable in upper respiratory specimens during the acute phase of infection. The lowest concentration of SARS-CoV-2 viral copies this assay can detect is 138 copies/mL. A negative result does not preclude SARS-Cov-2 infection and should not be used as the sole basis for treatment or other patient management decisions. A negative result may occur with  improper specimen collection/handling, submission of specimen other than nasopharyngeal swab, presence of viral mutation(s) within the areas targeted by this assay, and inadequate number of viral copies(<138 copies/mL). A negative result must be combined with clinical  observations, patient history, and epidemiological information. The expected result is Negative.  Fact Sheet for Patients:  bloggercourse.com  Fact Sheet for Healthcare Providers:  seriousbroker.it  This test is no t yet approved or cleared by the [place]  FDA and  has been authorized for detection and/or diagnosis of SARS-CoV-2 by FDA under an Emergency Use Authorization (EUA). This EUA will remain  in effect (meaning this test can be used) for the duration of the COVID-19 declaration under Section 564(b)(1) of the Act, 21 U.S.C.section 360bbb-3(b)(1), unless the authorization is terminated  or revoked sooner.       Influenza A by PCR NEGATIVE NEGATIVE Final   Influenza B by PCR NEGATIVE NEGATIVE Final    Comment: (NOTE) The Xpert Xpress SARS-CoV-2/FLU/RSV plus assay is intended as an aid in the diagnosis of influenza from Nasopharyngeal swab specimens and should not be used as a sole basis for treatment. Nasal washings and aspirates are unacceptable for Xpert Xpress SARS-CoV-2/FLU/RSV testing.  Fact Sheet for Patients: bloggercourse.com  Fact Sheet for Healthcare Providers: seriousbroker.it  This test is not yet approved or cleared by the [place]  FDA and has been authorized for detection and/or diagnosis of SARS-CoV-2 by FDA under an Emergency Use Authorization (EUA). This EUA will remain in effect (meaning this test can be used) for the duration of the COVID-19 declaration under Section 564(b)(1) of the Act, 21 U.S.C. section 360bbb-3(b)(1), unless the authorization is terminated or revoked.     Resp Syncytial Virus by PCR NEGATIVE NEGATIVE Final    Comment: (NOTE) Fact Sheet for Patients: bloggercourse.com  Fact Sheet for Healthcare Providers: seriousbroker.it  This test is not yet approved or cleared by  the [place]  FDA and has been authorized for detection and/or diagnosis of SARS-CoV-2 by FDA under an Emergency Use Authorization (EUA). This EUA will remain in effect (meaning this test can be used) for the duration of the COVID-19 declaration under Section 564(b)(1) of the Act, 21 U.S.C. section 360bbb-3(b)(1), unless the authorization is terminated or revoked.  Performed at [facility], [phone]  [address] [place], [place] [address]          Radiology Studies: US  ASCITES (ABDOMEN LIMITED) Result Date: 05/07/2024 CLINICAL DATA:  Ascites EXAM: LIMITED ABDOMEN ULTRASOUND FOR ASCITES TECHNIQUE: Limited ultrasound survey for ascites was performed in all four abdominal quadrants. COMPARISON:  CT abdomen pelvis 05/01/2024 FINDINGS: No abdominal ascites identified. IMPRESSION: No ascites. Electronically Signed   By: Aliene Lloyd M.D.   On: 05/07/2024 18:46        Scheduled Meds:  fluticasone  furoate-vilanterol  1 puff Inhalation Daily   folic acid   1 mg Oral Daily   heparin   5,000 Units Subcutaneous Q8H   levofloxacin   500 mg Oral Q48H   loratadine   10 mg Oral Daily   tamsulosin   0.4 mg Oral QPC supper   thiamine   100 mg Oral Daily   Continuous Infusions:  sodium chloride  75 mL/hr at 05/07/24 1513     LOS: 7 days       Anthony CHRISTELLA Pouch, MD [facility] Pager [phone]  If 7PM-7AM, please contact night-coverage [email or web address]  05/08/2024, 10:32 AM   "

## 2024-05-08 NOTE — Plan of Care (Signed)
  Problem: Fluid Volume: Goal: Hemodynamic stability will improve Outcome: Progressing   Problem: Clinical Measurements: Goal: Diagnostic test results will improve Outcome: Progressing Goal: Signs and symptoms of infection will decrease Outcome: Progressing   Problem: Respiratory: Goal: Ability to maintain adequate ventilation will improve Outcome: Progressing   Problem: Education: Goal: Knowledge of General Education information will improve Description: Including pain rating scale, medication(s)/side effects and non-pharmacologic comfort measures Outcome: Progressing   Problem: Health Behavior/Discharge Planning: Goal: Ability to manage health-related needs will improve Outcome: Progressing   Problem: Clinical Measurements: Goal: Ability to maintain clinical measurements within normal limits will improve Outcome: Progressing Goal: Will remain free from infection Outcome: Progressing Goal: Diagnostic test results will improve Outcome: Progressing Goal: Respiratory complications will improve Outcome: Progressing Goal: Cardiovascular complication will be avoided Outcome: Progressing   Problem: Activity: Goal: Risk for activity intolerance will decrease Outcome: Progressing   Problem: Nutrition: Goal: Adequate nutrition will be maintained Outcome: Progressing   Problem: Coping: Goal: Level of anxiety will decrease Outcome: Progressing   Problem: Elimination: Goal: Will not experience complications related to bowel motility Outcome: Progressing Goal: Will not experience complications related to urinary retention Outcome: Progressing   Problem: Pain Managment: Goal: General experience of comfort will improve and/or be controlled Outcome: Progressing   Problem: Safety: Goal: Ability to remain free from injury will improve Outcome: Progressing   Problem: Skin Integrity: Goal: Risk for impaired skin integrity will decrease Outcome: Progressing   Problem:  Education: Goal: Knowledge of General Education information will improve Description: Including pain rating scale, medication(s)/side effects and non-pharmacologic comfort measures Outcome: Progressing   Problem: Health Behavior/Discharge Planning: Goal: Ability to manage health-related needs will improve Outcome: Progressing   Problem: Clinical Measurements: Goal: Ability to maintain clinical measurements within normal limits will improve Outcome: Progressing Goal: Will remain free from infection Outcome: Progressing Goal: Diagnostic test results will improve Outcome: Progressing Goal: Respiratory complications will improve Outcome: Progressing Goal: Cardiovascular complication will be avoided Outcome: Progressing   Problem: Activity: Goal: Risk for activity intolerance will decrease Outcome: Progressing   Problem: Nutrition: Goal: Adequate nutrition will be maintained Outcome: Progressing   Problem: Coping: Goal: Level of anxiety will decrease Outcome: Progressing   Problem: Elimination: Goal: Will not experience complications related to bowel motility Outcome: Progressing Goal: Will not experience complications related to urinary retention Outcome: Progressing   Problem: Pain Managment: Goal: General experience of comfort will improve and/or be controlled Outcome: Progressing   Problem: Safety: Goal: Ability to remain free from injury will improve Outcome: Progressing   Problem: Skin Integrity: Goal: Risk for impaired skin integrity will decrease Outcome: Progressing

## 2024-05-09 DIAGNOSIS — F17210 Nicotine dependence, cigarettes, uncomplicated: Secondary | ICD-10-CM | POA: Diagnosis not present

## 2024-05-09 DIAGNOSIS — F1010 Alcohol abuse, uncomplicated: Secondary | ICD-10-CM | POA: Diagnosis not present

## 2024-05-09 DIAGNOSIS — J449 Chronic obstructive pulmonary disease, unspecified: Secondary | ICD-10-CM | POA: Diagnosis not present

## 2024-05-09 DIAGNOSIS — N390 Urinary tract infection, site not specified: Secondary | ICD-10-CM | POA: Diagnosis not present

## 2024-05-09 DIAGNOSIS — B1920 Unspecified viral hepatitis C without hepatic coma: Secondary | ICD-10-CM

## 2024-05-09 DIAGNOSIS — R7881 Bacteremia: Secondary | ICD-10-CM

## 2024-05-09 DIAGNOSIS — N179 Acute kidney failure, unspecified: Secondary | ICD-10-CM | POA: Diagnosis not present

## 2024-05-09 DIAGNOSIS — B9620 Unspecified Escherichia coli [E. coli] as the cause of diseases classified elsewhere: Secondary | ICD-10-CM | POA: Diagnosis not present

## 2024-05-09 DIAGNOSIS — D72829 Elevated white blood cell count, unspecified: Secondary | ICD-10-CM | POA: Diagnosis not present

## 2024-05-09 LAB — BASIC METABOLIC PANEL WITH GFR
Anion gap: 11 (ref 5–15)
BUN: 40 mg/dL — ABNORMAL HIGH (ref 8–23)
CO2: 21 mmol/L — ABNORMAL LOW (ref 22–32)
Calcium: 8.3 mg/dL — ABNORMAL LOW (ref 8.9–10.3)
Chloride: 102 mmol/L (ref 98–111)
Creatinine, Ser: 3.23 mg/dL — ABNORMAL HIGH (ref 0.61–1.24)
GFR, Estimated: 21 mL/min — ABNORMAL LOW
Glucose, Bld: 86 mg/dL (ref 70–99)
Potassium: 4.2 mmol/L (ref 3.5–5.1)
Sodium: 135 mmol/L (ref 135–145)

## 2024-05-09 LAB — CBC
HCT: 28 % — ABNORMAL LOW (ref 39.0–52.0)
Hemoglobin: 9.2 g/dL — ABNORMAL LOW (ref 13.0–17.0)
MCH: 29.9 pg (ref 26.0–34.0)
MCHC: 32.9 g/dL (ref 30.0–36.0)
MCV: 90.9 fL (ref 80.0–100.0)
Platelets: 465 K/uL — ABNORMAL HIGH (ref 150–400)
RBC: 3.08 MIL/uL — ABNORMAL LOW (ref 4.22–5.81)
RDW: 16.1 % — ABNORMAL HIGH (ref 11.5–15.5)
WBC: 12.6 K/uL — ABNORMAL HIGH (ref 4.0–10.5)
nRBC: 0 % (ref 0.0–0.2)

## 2024-05-09 MED ORDER — LACTULOSE 10 GM/15ML PO SOLN
20.0000 g | Freq: Two times a day (BID) | ORAL | Status: DC
  Administered 2024-05-09 – 2024-05-11 (×5): 20 g via ORAL
  Filled 2024-05-09 (×5): qty 30

## 2024-05-09 MED ORDER — AMLODIPINE BESYLATE 10 MG PO TABS
10.0000 mg | ORAL_TABLET | Freq: Every day | ORAL | Status: DC
  Administered 2024-05-09 – 2024-05-11 (×3): 10 mg via ORAL
  Filled 2024-05-09 (×3): qty 1

## 2024-05-09 MED ORDER — FUROSEMIDE 10 MG/ML IJ SOLN
40.0000 mg | Freq: Every day | INTRAMUSCULAR | Status: DC
  Administered 2024-05-09 – 2024-05-10 (×2): 40 mg via INTRAVENOUS
  Filled 2024-05-09 (×2): qty 4

## 2024-05-09 NOTE — Progress Notes (Signed)
 PT Cancellation Note  Patient Details Name: Edward Macias MRN: [id number] DOB: 12-Dec-1960   Cancelled Treatment:    Reason Eval/Treat Not Completed: PT screened, no needs identified, will sign off (Consult received and chart reviewed.  Per OT, patient indep with mobility; no acute PT needs identified.  Additionally, patient declining formal PT evaluation for assessment/education.  Will complete orders; please reconsult should needs change.)  Abiel Antrim H. Delores, PT, DPT, NCS 05/09/2024, 2:03 PM [phone]

## 2024-05-09 NOTE — Progress Notes (Signed)
 "  Date of Admission:  05/01/2024      ID: Edward Macias is a 63 y.o. male Principal Problem:   Severe sepsis (HCC) Active Problems:   Peripheral neuropathy   History of asthma   COPD (chronic obstructive pulmonary disease) (HCC)   Tobacco abuse   Pyelonephritis   Hyponatremia   AKI (acute kidney injury)   Renal and perinephric abscess   Alcohol use disorder   Hepatitis C infection   Complicated UTI (urinary tract infection)    Subjective: He is stable Not had a bowel movt in 2 days No pain abdomen but can have bloating  Medications:   fluticasone  furoate-vilanterol  1 puff Inhalation Daily   folic acid   1 mg Oral Daily   heparin   5,000 Units Subcutaneous Q8H   lactulose   20 g Oral BID   levofloxacin   500 mg Oral Q48H   loratadine   10 mg Oral Daily   tamsulosin   0.4 mg Oral QPC supper   thiamine   100 mg Oral Daily    Objective: Vital signs in last 24 hours: Patient Vitals for the past 24 hrs:  BP Temp Temp src Pulse Resp SpO2  05/09/24 1405 (!) 140/83 97.9 F (36.6 C) Oral 92 18 99 %  05/09/24 0738 -- -- -- -- -- 98 %  05/09/24 0735 127/78 98 F (36.7 C) Oral 81 18 100 %  05/09/24 0635 133/69 97.9 F (36.6 C) Oral 82 18 100 %  05/08/24 2045 135/77 98.5 F (36.9 C) -- 84 20 96 %  05/08/24 1655 136/71 98.5 F (36.9 C) -- 82 18 95 %      PHYSICAL EXAM:  General: Alert, cooperative, no distress, appears stated age.  Lungs:b/l air entry Heart: Regular rate and rhythm, no murmur, rub or gallop. Abdomen: Soft, no tenderness to palpation Extremities: atraumatic, no cyanosis. No edema. No clubbing Skin: No rashes or lesions. Or bruising Lymph: Cervical, supraclavicular normal. Neurologic: Grossly non-focal  Lab Results    Latest Ref Rng & Units 05/09/2024   11:18 AM 05/08/2024    4:27 AM 05/07/2024    8:34 AM  CBC  WBC 4.0 - 10.5 K/uL 12.6  13.8  13.7   Hemoglobin 13.0 - 17.0 g/dL 9.2  8.2  8.3   Hematocrit 39.0 - 52.0 % 28.0  24.4  25.2   Platelets  150 - 400 K/uL 465  391  366        Latest Ref Rng & Units 05/09/2024   11:18 AM 05/08/2024    4:27 AM 05/07/2024    8:34 AM  CMP  Glucose 70 - 99 mg/dL 86  893  95   BUN 8 - 23 mg/dL 40  42  41   Creatinine 0.61 - 1.24 mg/dL 6.76  6.14  6.22   Sodium 135 - 145 mmol/L 135  135  133   Potassium 3.5 - 5.1 mmol/L 4.2  4.1  3.8   Chloride 98 - 111 mmol/L 102  104  101   CO2 22 - 32 mmol/L 21  22  21    Calcium 8.9 - 10.3 mg/dL 8.3  7.6  7.5       Microbiology: 04/23/24 BC- 2/2 sets pan sensitive Ecoli 04/23/24 UC Ecoli 05/01/24 BC- NG UC- NG Studies/Results: CT abdomen and pelvis with contrast shows mild irregularity of the liver contour suspicious for cirrhosis Heterogeneous nephrogram bilaterally consistent with pyelonephritis No abscess Enhancement of the urothelium of the ureters consistent with ascending UTI. The prostate and  seminal vesicles are grossly unremarkable  Assessment/Plan: 63 year old male with history for COPD with hospitalization in October and November for COPD exacerbation now been in and out of hospital since 04/23/2024 for complicated urinary tract infection with E. coli.     E. coli bacteremia with complicated UTI on 04/23/2024 Pansensitive E. coli He was placed initially on ceftriaxone  and then on p.o. Levaquin  but has been in and out of [facility] and [facility] since then.  The leukocytosis has been slow to respond He was started on Zosyn  this admission Leukocytosis has declined from 30 >12.6- changed to levaQUIN  PO ON 05/06/24 This is a pansensitive E. coli.  Patient noted to have complicated pyelonephritis with questionable microabscesses in the right lower lobe wall area so the plan was to give him at least 2 to 3 weeks of antibiotics On Levaquin  adjusted to Crcl- Duration for 2 weeks until 05/16/24  Hepatitis C active- will have to see GI as OP for treatment.Please make appt with [facility] on discharge Cirrhosis by imaging  ETOH abuse  AKI- likley related  to the contrast he has received with the CT scans he has had 3 CTs with contrast in the last 2 weeks Improving gradually PVR not significant   COPD  Discussed the management with the patient  ID will sign off- call if needed   "

## 2024-05-09 NOTE — Evaluation (Signed)
 Occupational Therapy Evaluation Patient Details Name: Edward Macias MRN: [id number] DOB: 14-Nov-1960 Today's Date: 05/09/2024   History of Present Illness   63 year old male with history for COPD with hospitalization in October and November for COPD exacerbation now been in and out of hospital since 04/23/2024 for complicated urinary tract infection with E. coli. New dx of active Hepatitis C.     Clinical Impressions Pt was seen for OT evaluation this date. Prior to hospital admission, pt was indep. New O2 as of last admission ~1wk ago per pt. Pt received in bathroom, bed alarm going off and pt had removed the supplemental O2. Pt returned to EOB once completed and endorsing SOB. SpO2 noted to be 82% on RA. Nasal cannula reapplied and with VC for PLB, improved to 94% on 2L within . Pt edu in need for O2 with exertional activity at this time. Pt verbalized understanding. Pt presents with deficits in cardiopulmonary status, activity tolerance, and safety limiting their ability to perform ADL management at baseline level. Pt currently requires increased time/effort to complete exertional activities. Pt would benefit from skilled OT services to address noted impairments and functional limitations with emphasis on home O2 use and ECS (see below for any additional details) in order to maximize safety and independence while minimizing future risk of falls, injury, and readmission. Do not anticipate the need for follow up OT services upon acute hospital DC.    If plan is discharge home, recommend the following:   A little help with bathing/dressing/bathroom;Assistance with cooking/housework;Assist for transportation;Help with stairs or ramp for entrance     Functional Status Assessment   Patient has had a recent decline in their functional status and demonstrates the ability to make significant improvements in function in a reasonable and predictable amount of time.     Equipment  Recommendations   None recommended by OT     Recommendations for Other Services         Precautions/Restrictions   Precautions Precautions: Fall;Other (comment) Recall of Precautions/Restrictions: Impaired Precaution/Restrictions Comments: watch SpO2 Restrictions Weight Bearing Restrictions Per Provider Order: No     Mobility Bed Mobility Overal bed mobility: Modified Independent                  Transfers Overall transfer level: Modified independent Equipment used: None                      Balance Overall balance assessment: No apparent balance deficits (not formally assessed)                                         ADL either performed or assessed with clinical judgement   ADL Overall ADL's : Modified independent                                       General ADL Comments: Pt completed all aspects of toileting and clothing mgt with mod indep. Does desats with exertion on RA. Pt does endorse abdominal discomfort wiht bending for LB ADL tasks.     Vision         Perception         Praxis         Pertinent Vitals/Pain Pain Assessment Pain Assessment: 0-10 Pain Score: 10-Worst pain ever Pain Location: head  Pain Descriptors / Indicators: Headache Pain Intervention(s): Limited activity within patient's tolerance, Repositioned, Premedicated before session, Patient requesting pain meds-RN notified     Extremity/Trunk Assessment Upper Extremity Assessment Upper Extremity Assessment: Overall WFL for tasks assessed   Lower Extremity Assessment Lower Extremity Assessment: Overall WFL for tasks assessed       Communication Communication Communication: No apparent difficulties   Cognition Arousal: Alert   Cognition: No apparent impairments             OT - Cognition Comments: a bit impulsive, removed O2 and left bed wihtout assist to use bathroom, setting off the bed alarm, questionable safety  awareness/awareness of deficits                 Following commands: Intact       Cueing  General Comments   Cueing Techniques: Verbal cues  After toileting, pt ambulated back to bed without assist, SpO2 noted at 82% on RA, O2 reapplied and with VC for PLB improves to 94% within . RN notified.   Exercises Other Exercises Other Exercises: Pt introduced to role of acute OT and edu in PLB, need for O2 at this time with exertional activity   Shoulder Instructions      Home Living Family/patient expects to be discharged to:: Private residence Living Arrangements: Spouse/significant other                                      Prior Functioning/Environment               Mobility Comments: indep prior to recent admission ADLs Comments: indep prior to recent admission, new O2 requirements since last admission ~1wk ago    OT Problem List: Cardiopulmonary status limiting activity;Pain;Decreased safety awareness;Decreased activity tolerance;Decreased knowledge of use of DME or AE   OT Treatment/Interventions: Self-care/ADL training;Therapeutic activities;Therapeutic exercise;Energy conservation;DME and/or AE instruction;Patient/family education      OT Goals(Current goals can be found in the care plan section)   Acute Rehab OT Goals Patient Stated Goal: have less pain and go home OT Goal Formulation: With patient Time For Goal Achievement: 05/23/24 Potential to Achieve Goals: Good ADL Goals Additional ADL Goal #1: Pt will demo indep with O2 use including use of pulse oximeter to self monitor O2 levels. Additional ADL Goal #2: Pt will verbalize plan to implement at least 2 learned ECS to maximize safety/indep and minimize SOB/over exertion.   OT Frequency:  Min 1X/week    Co-evaluation              AM-PAC OT 6 Clicks Daily Activity     Outcome Measure Help from another person eating meals?: None Help from another person taking care of  personal grooming?: None Help from another person toileting, which includes using toliet, bedpan, or urinal?: None Help from another person bathing (including washing, rinsing, drying)?: A Little Help from another person to put on and taking off regular upper body clothing?: None Help from another person to put on and taking off regular lower body clothing?: A Little 6 Click Score: 22   End of Session Equipment Utilized During Treatment: Oxygen Nurse Communication: Mobility status;Other (comment);Patient requests pain meds (O2 sats)  Activity Tolerance: Patient limited by pain Patient left: in bed;with call bell/phone within reach;with bed alarm set  OT Visit Diagnosis: Other abnormalities of gait and mobility (R26.89)  Time: 8654-8642 OT Time Calculation (min): 12 min Charges:  OT General Charges $OT Visit: 1 Visit OT Evaluation $OT Eval Low Complexity: 1 Low  Warren SAUNDERS., MPH, MS, OTR/L ascom [phone] 05/09/2024, 2:18 PM

## 2024-05-09 NOTE — Progress Notes (Signed)
 " PROGRESS NOTE    Edward Macias  FMW:[id number] DOB: [birth year] DOA: 05/01/2024 PCP: Pcp, No    Assessment & Plan:   Principal Problem:   Severe sepsis (HCC) Active Problems:   Peripheral neuropathy   History of asthma   COPD (chronic obstructive pulmonary disease) (HCC)   Tobacco abuse   Pyelonephritis   Hyponatremia   AKI (acute kidney injury)   Renal and perinephric abscess   Alcohol use disorder   Hepatitis C infection   Complicated UTI (urinary tract infection)  Assessment and Plan: Bacteremia: blood cxs grew e.coli. Likely secondary to e.coli UTI/pyelonephritis. Pan-sensitive, discharged on 2 week course of oral levofloxacin . Presented to outside hospital a couple of days later with persistent symptoms, there CT showed continued pyelo, possible microabscesses in kidneys, levofloxacin  continued. Presents here with lack of appetite, continues to feel feel very poorly, ongoing abdominal pain. CT shows ascending UTI with bilateral pyelonephritis, no abscess or other complication. Bladder scan negative for retention. Repeat blood cxs NGTD. Urine culture is negative. Continue on levaqiun   AKI: Cr is trending down from day prior. Recently had CTs w/ contrast.    Alcohol use disorder: reports history heavy drinking, withdrawal symptoms when he stops. Last drink about 3 weeks ago, no s/s withdrawal currently. Mild ascites seen on CT but no ascites seen on US  so unable to do a paracentesis.    Hepatitis C: new dx  based on labs obtained at [facility] this month. Signs of cirrhosis on CT. Will need to f/u outpatient w/ GI  Likely alcoholic cirrhosis: not drank alcohol in 3 weeks as per pt. Lactulose  prn. Continue on IV lasix  as Cr is trending down again today for edema    COPD: w/o exacerbation. Bronchodilators prn    HTN: restart home dose of amlodipine     Constipation: resolved w/ lactulose        DVT prophylaxis: heparin   Code Status:  full  Family Communication: Disposition  Plan: likely d/c back home   Level of care: Med-Surg  Status is: Inpatient Remains inpatient appropriate because: severity of illness    Consultants:    Procedures:   Antimicrobials: levaquin   Subjective: Pt c/o not having a good bowel movement   Objective: Vitals:   05/08/24 2045 05/09/24 0635 05/09/24 0735 05/09/24 0738  BP: 135/77 133/69 127/78   Pulse: 84 82 81   Resp: 20 18 18    Temp: 98.5 F (36.9 C) 97.9 F (36.6 C) 98 F (36.7 C)   TempSrc:  Oral Oral   SpO2: 96% 100% 100% 98%  Weight:      Height:        Intake/Output Summary (Last 24 hours) at 05/09/2024 9094 Last data filed at 05/09/2024 0300 Gross per 24 hour  Intake --  Output 1150 ml  Net -1150 ml   Filed Weights   05/01/24 1139  Weight: 67.6 kg    Examination:  General exam: Appears uncomfortable  Respiratory system: decreased breath sounds b/l  Cardiovascular system: S1/S2+.  +1 pitting edema of b/l LE  Gastrointestinal system: Abd is soft, tenderness to palpation, distended & hypoactive bowel sounds  Central nervous system: alert & oriented. Moves all extremities  Psychiatry: Judgement and insight appears at baseline. Flat mood and affect    Data Reviewed: I have personally reviewed following labs and imaging studies  CBC: Recent Labs  Lab 05/04/24 0753 05/05/24 0454 05/06/24 0553 05/07/24 0834 05/08/24 0427  WBC 18.7* 16.9* 15.0* 13.7* 13.8*  HGB 8.3* 8.0* 8.0* 8.3*  8.2*  HCT 24.1* 23.7* 23.9* 25.2* 24.4*  MCV 89.6 91.2 90.9 91.0 90.4  PLT 393 389 363 366 391   Basic Metabolic Panel: Recent Labs  Lab 05/04/24 0753 05/05/24 0454 05/06/24 0553 05/07/24 0834 05/08/24 0427  NA 132* 135 132* 133* 135  K 3.8 3.6 3.7 3.8 4.1  CL 98 98 99 101 104  CO2 25 24 23  21* 22  GLUCOSE 109* 143* 89 95 106*  BUN 32* 39* 39* 41* 42*  CREATININE 3.02* 3.94* 3.94* 3.77* 3.85*  CALCIUM 7.4* 7.5* 7.4* 7.5* 7.6*   GFR: Estimated Creatinine Clearance: 18.5 mL/min (A) (by C-G formula  based on SCr of 3.85 mg/dL (H)). Liver Function Tests: Recent Labs  Lab 05/05/24 1421  AST 27  ALT 14  ALKPHOS 62  BILITOT 0.4  PROT 6.9  ALBUMIN 2.8*   No results for input(s): LIPASE, AMYLASE in the last 168 hours.  No results for input(s): AMMONIA in the last 168 hours. Coagulation Profile: No results for input(s): INR, PROTIME in the last 168 hours.  Cardiac Enzymes: Recent Labs  Lab 05/05/24 1421  CKTOTAL 30*   BNP (last 3 results) Recent Labs    05/01/24 1548  PROBNP 416.0*   HbA1C: No results for input(s): HGBA1C in the last 72 hours. CBG: Recent Labs  Lab 05/02/24 2308  GLUCAP 108*   Lipid Profile: No results for input(s): CHOL, HDL, LDLCALC, TRIG, CHOLHDL, LDLDIRECT in the last 72 hours. Thyroid Function Tests: No results for input(s): TSH, T4TOTAL, FREET4, T3FREE, THYROIDAB in the last 72 hours. Anemia Panel: No results for input(s): VITAMINB12, FOLATE, FERRITIN, TIBC, IRON, RETICCTPCT in the last 72 hours. Sepsis Labs: No results for input(s): PROCALCITON, LATICACIDVEN in the last 168 hours.   Recent Results (from the past 240 hours)  Culture, blood (routine x 2)     Status: None   Collection Time: 05/01/24 12:00 PM   Specimen: BLOOD  Result Value Ref Range Status   Specimen Description BLOOD BLOOD RIGHT ARM  Final   Special Requests   Final    BOTTLES DRAWN AEROBIC AND ANAEROBIC Blood Culture adequate volume   Culture   Final    NO GROWTH 5 DAYS Performed at [facility], [address], [place], [place] [address]    Report Status 05/06/2024 FINAL  Final  Culture, blood (routine x 2)     Status: None   Collection Time: 05/01/24 12:42 PM   Specimen: BLOOD  Result Value Ref Range Status   Specimen Description BLOOD BLOOD LEFT ARM  Final   Special Requests   Final    BOTTLES DRAWN AEROBIC AND ANAEROBIC Blood Culture adequate volume   Culture   Final    NO GROWTH 5 DAYS Performed  at [facility], [address], [place], [place] [address]    Report Status 05/06/2024 FINAL  Final  Urine Culture     Status: None   Collection Time: 05/01/24  4:15 PM   Specimen: Urine, Random  Result Value Ref Range Status   Specimen Description   Final    URINE, RANDOM Performed at [facility], [address], [place], [place] [address]    Special Requests   Final    NONE Reflexed from [id number] Performed at [facility], [address] [place], [place] [address]    Culture   Final    NO GROWTH Performed at [facility], [address], [place], [place] [address]    Report  Status 05/03/2024 FINAL  Final  Resp panel by RT-PCR (RSV, Flu A&B, Covid) Anterior Nasal Swab     Status: None   Collection Time: 05/01/24  5:30 PM   Specimen: Anterior Nasal Swab  Result Value Ref Range Status   SARS Coronavirus 2 by RT PCR NEGATIVE NEGATIVE Final    Comment: (NOTE) SARS-CoV-2 target nucleic acids are NOT DETECTED.  The SARS-CoV-2 RNA is generally detectable in upper respiratory specimens during the acute phase of infection. The lowest concentration of SARS-CoV-2 viral copies this assay can detect is 138 copies/mL. A negative result does not preclude SARS-Cov-2 infection and should not be used as the sole basis for treatment or other patient management decisions. A negative result may occur with  improper specimen collection/handling, submission of specimen other than nasopharyngeal swab, presence of viral mutation(s) within the areas targeted by this assay, and inadequate number of viral copies(<138 copies/mL). A negative result must be combined with clinical observations, patient history, and epidemiological information. The expected result is Negative.  Fact Sheet for Patients:  bloggercourse.com  Fact Sheet for Healthcare Providers:  seriousbroker.it  This test is no t yet approved or  cleared by the [place]  FDA and  has been authorized for detection and/or diagnosis of SARS-CoV-2 by FDA under an Emergency Use Authorization (EUA). This EUA will remain  in effect (meaning this test can be used) for the duration of the COVID-19 declaration under Section 564(b)(1) of the Act, 21 U.S.C.section 360bbb-3(b)(1), unless the authorization is terminated  or revoked sooner.       Influenza A by PCR NEGATIVE NEGATIVE Final   Influenza B by PCR NEGATIVE NEGATIVE Final    Comment: (NOTE) The Xpert Xpress SARS-CoV-2/FLU/RSV plus assay is intended as an aid in the diagnosis of influenza from Nasopharyngeal swab specimens and should not be used as a sole basis for treatment. Nasal washings and aspirates are unacceptable for Xpert Xpress SARS-CoV-2/FLU/RSV testing.  Fact Sheet for Patients: bloggercourse.com  Fact Sheet for Healthcare Providers: seriousbroker.it  This test is not yet approved or cleared by the [place]  FDA and has been authorized for detection and/or diagnosis of SARS-CoV-2 by FDA under an Emergency Use Authorization (EUA). This EUA will remain in effect (meaning this test can be used) for the duration of the COVID-19 declaration under Section 564(b)(1) of the Act, 21 U.S.C. section 360bbb-3(b)(1), unless the authorization is terminated or revoked.     Resp Syncytial Virus by PCR NEGATIVE NEGATIVE Final    Comment: (NOTE) Fact Sheet for Patients: bloggercourse.com  Fact Sheet for Healthcare Providers: seriousbroker.it  This test is not yet approved or cleared by the [place]  FDA and has been authorized for detection and/or diagnosis of SARS-CoV-2 by FDA under an Emergency Use Authorization (EUA). This EUA will remain in effect (meaning this test can be used) for the duration of the COVID-19 declaration under Section 564(b)(1) of the Act, 21  U.S.C. section 360bbb-3(b)(1), unless the authorization is terminated or revoked.  Performed at [facility], [address] [place], [place] [address]          Radiology Studies: US  ASCITES (ABDOMEN LIMITED) Result Date: 05/07/2024 CLINICAL DATA:  Ascites EXAM: LIMITED ABDOMEN ULTRASOUND FOR ASCITES TECHNIQUE: Limited ultrasound survey for ascites was performed in all four abdominal quadrants. COMPARISON:  CT abdomen pelvis 05/01/2024 FINDINGS: No abdominal ascites identified. IMPRESSION: No ascites. Electronically Signed   By: Aliene Lloyd M.D.   On: 05/07/2024 18:46  Scheduled Meds:  fluticasone  furoate-vilanterol  1 puff Inhalation Daily   folic acid   1 mg Oral Daily   furosemide   40 mg Intravenous Daily   heparin   5,000 Units Subcutaneous Q8H   levofloxacin   500 mg Oral Q48H   loratadine   10 mg Oral Daily   tamsulosin   0.4 mg Oral QPC supper   thiamine   100 mg Oral Daily   Continuous Infusions:     LOS: 8 days       Anthony CHRISTELLA Pouch, MD [facility] Pager [phone]  If 7PM-7AM, please contact night-coverage [email or web address]  05/09/2024, 9:05 AM   "

## 2024-05-10 LAB — BASIC METABOLIC PANEL WITH GFR
Anion gap: 11 (ref 5–15)
BUN: 42 mg/dL — ABNORMAL HIGH (ref 8–23)
CO2: 22 mmol/L (ref 22–32)
Calcium: 8.6 mg/dL — ABNORMAL LOW (ref 8.9–10.3)
Chloride: 103 mmol/L (ref 98–111)
Creatinine, Ser: 3.53 mg/dL — ABNORMAL HIGH (ref 0.61–1.24)
GFR, Estimated: 19 mL/min — ABNORMAL LOW
Glucose, Bld: 97 mg/dL (ref 70–99)
Potassium: 4.3 mmol/L (ref 3.5–5.1)
Sodium: 136 mmol/L (ref 135–145)

## 2024-05-10 LAB — CBC
HCT: 28 % — ABNORMAL LOW (ref 39.0–52.0)
Hemoglobin: 9.2 g/dL — ABNORMAL LOW (ref 13.0–17.0)
MCH: 29.5 pg (ref 26.0–34.0)
MCHC: 32.9 g/dL (ref 30.0–36.0)
MCV: 89.7 fL (ref 80.0–100.0)
Platelets: 511 K/uL — ABNORMAL HIGH (ref 150–400)
RBC: 3.12 MIL/uL — ABNORMAL LOW (ref 4.22–5.81)
RDW: 16.3 % — ABNORMAL HIGH (ref 11.5–15.5)
WBC: 15.5 K/uL — ABNORMAL HIGH (ref 4.0–10.5)
nRBC: 0 % (ref 0.0–0.2)

## 2024-05-10 NOTE — Progress Notes (Signed)
 Occupational Therapy Treatment Patient Details Name: Marquon Alcala MRN: [id number] DOB: 03-01-1961 Today's Date: 05/10/2024   History of present illness 63 year old male with history for COPD with hospitalization in October and November for COPD exacerbation now been in and out of hospital since 04/23/2024 for complicated urinary tract infection with E. coli. New dx of active Hepatitis C.   OT comments  Pt seen for OT tx. Pt seated in recliner, on room air, and denies complaints. Pt endorses feeling a bit better today. SpO2 91-93% on RA. Pt reports having taken his O2 off himself. (RN notified at end of session). Pt edu in ECS with handout provided to maximize recall and carryover. Pt verbalized understanding, denied concerns or questions. Has met acute OT goals. Will sign off.       If plan is discharge home, recommend the following:  Assistance with cooking/housework;Assist for transportation;Help with stairs or ramp for entrance   Equipment Recommendations  None recommended by OT       Precautions / Restrictions Precautions Recall of Precautions/Restrictions: Intact Precaution/Restrictions Comments: watch SpO2 Restrictions Weight Bearing Restrictions Per Provider Order: No        ADL either performed or assessed with clinical judgement   ADL Overall ADL's : Modified independent           Communication Communication Communication: No apparent difficulties   Cognition Arousal: Alert Behavior During Therapy: WFL for tasks assessed/performed Cognition: No apparent impairments       Following commands: Intact        Cueing   Cueing Techniques: Verbal cues  Exercises Other Exercises Other Exercises: Pt edu in ECS with handout provided to maximize recall and carryover.    Shoulder Instructions       General Comments      Pertinent Vitals/ Pain       Pain Assessment Pain Assessment: No/denies pain   Frequency  Min 1X/week        Progress Toward  Goals  OT Goals(current goals can now be found in the care plan section)  Progress towards OT goals: Goals met/education completed, patient discharged from OT  Acute Rehab OT Goals Patient Stated Goal: have less pain and go home OT Goal Formulation: All assessment and education complete, DC therapy Time For Goal Achievement: 05/23/24 Potential to Achieve Goals: Good   AM-PAC OT 6 Clicks Daily Activity     Outcome Measure   Help from another person eating meals?: None Help from another person taking care of personal grooming?: None Help from another person toileting, which includes using toliet, bedpan, or urinal?: None Help from another person bathing (including washing, rinsing, drying)?: None Help from another person to put on and taking off regular upper body clothing?: None Help from another person to put on and taking off regular lower body clothing?: None 6 Click Score: 24    End of Session    OT Visit Diagnosis: Other abnormalities of gait and mobility (R26.89)   Activity Tolerance Patient tolerated treatment well   Patient Left in chair;with call bell/phone within reach   Nurse Communication Other (comment) (O2)        Time: 8464-8455 OT Time Calculation (min): 9 min  Charges: OT General Charges $OT Visit: 1 Visit OT Treatments $Therapeutic Activity: 8-22 mins  Warren SAUNDERS., MPH, MS, OTR/L ascom [phone] 05/10/2024, 4:39 PM

## 2024-05-10 NOTE — Progress Notes (Signed)
 " PROGRESS NOTE   HPI was taken from Dr. Sim: Edward Macias is a 63 y.o. male with medical history significant of osteoarthritis, asthma, COPD, recurrent pneumonia and recurrent UTI who has been admitted twice recently with the same complaint of acute pyelonephritis and treated with antibiotics.  Last discharge was on the 18th.  Patient was also at [facility] where he was seen and treated.  During last hospitalization patient was seen by infectious disease.  Patient discharged on oral antibiotics but came back today with fever chills tachycardia and UTI.  Patient meets sepsis criteria again.  Previously had E. coli with septicemia and bacteremia.  Not sure if it is the same now.  Patient will be admitted for sepsis due to recurrent pyelonephritis.    Edward Macias  FMW:[id number] DOB: 1960/05/24 DOA: 05/01/2024 PCP: Pcp, No    Assessment & Plan:   Principal Problem:   Severe sepsis (HCC) Active Problems:   Peripheral neuropathy   History of asthma   COPD (chronic obstructive pulmonary disease) (HCC)   Tobacco abuse   Pyelonephritis   Hyponatremia   AKI (acute kidney injury)   Renal and perinephric abscess   Alcohol use disorder   Hepatitis C infection   Complicated UTI (urinary tract infection)   E coli bacteremia  Assessment and Plan: Bacteremia: blood cxs grew e.coli. Likely secondary to e.coli UTI/pyelonephritis. Pan-sensitive, discharged on 2 week course of oral levofloxacin . Presented to outside hospital a couple of days later with persistent symptoms, there CT showed continued pyelo, possible microabscesses in kidneys, levofloxacin  continued. Presents here with lack of appetite, continues to feel feel very poorly, ongoing abdominal pain. CT shows ascending UTI with bilateral pyelonephritis, no abscess or other complication. Bladder scan negative for retention. Repeat blood cxs NGTD. Urine culture is negative. Continue on lequain until 05/16/24 as per ID    AKI: Cr is labile. Recently had  CTs w/ contrast.    Alcohol use disorder: reports history heavy drinking, withdrawal symptoms when he stops. Last drink about 3 weeks ago, no s/s withdrawal currently. Mild ascites seen on CT but no ascites seen on US  so unable to do a paracentesis.    Hepatitis C: new dx  based on labs obtained at [facility] this month. Signs of cirrhosis on CT. Will need to f/u outpatient w/ GI and pt verbalized his understanding   Likely alcoholic cirrhosis: not drank alcohol in 3 weeks as per pt. Lactulose  prn. Holding lasix  secondary to Cr trending up today. Will need to f/u outpatient w/ GI for this as well and pt verbalized his understanding    COPD: w/o exacerbation. Bronchodilators prn    HTN: continue on amlodipine     Constipation: resolved w/ lactulose        DVT prophylaxis: heparin   Code Status:  full  Family Communication: Disposition Plan: likely d/c back home   Level of care: Med-Surg  Status is: Inpatient Remains inpatient appropriate because: severity of illness, can likely d/c home tomorrow if Cr is stable and/or trending down.     Consultants:  ID  Procedures:   Antimicrobials: levaquin   Subjective: Pt c/o fatigue   Objective: Vitals:   05/09/24 1738 05/09/24 2014 05/10/24 0439 05/10/24 0750  BP: (!) 145/87 131/70 125/78 124/74  Pulse:  98 88 81  Resp:  20 20 16   Temp:  98.2 F (36.8 C) 98.2 F (36.8 C) 98.3 F (36.8 C)  TempSrc:      SpO2:  99% 100% 100%  Weight:  Height:        Intake/Output Summary (Last 24 hours) at 05/10/2024 0906 Last data filed at 05/10/2024 0441 Gross per 24 hour  Intake --  Output 1125 ml  Net -1125 ml   Filed Weights   05/01/24 1139  Weight: 67.6 kg    Examination:  General exam: Appears calm & comfortable  Respiratory system: diminished breath sounds b/l  Cardiovascular system: S1 &S2+.  +1 pitting edema of b/l LE  Gastrointestinal system: Abd is soft, NT, ND & hypoactive bowel sounds Central nervous system: alert &  oriented. Moves all extremities  Psychiatry: Judgement and insight appears at baseline. Flat mood and affect     Data Reviewed: I have personally reviewed following labs and imaging studies  CBC: Recent Labs  Lab 05/05/24 0454 05/06/24 0553 05/07/24 0834 05/08/24 0427 05/09/24 1118  WBC 16.9* 15.0* 13.7* 13.8* 12.6*  HGB 8.0* 8.0* 8.3* 8.2* 9.2*  HCT 23.7* 23.9* 25.2* 24.4* 28.0*  MCV 91.2 90.9 91.0 90.4 90.9  PLT 389 363 366 391 465*   Basic Metabolic Panel: Recent Labs  Lab 05/05/24 0454 05/06/24 0553 05/07/24 0834 05/08/24 0427 05/09/24 1118  NA 135 132* 133* 135 135  K 3.6 3.7 3.8 4.1 4.2  CL 98 99 101 104 102  CO2 24 23 21* 22 21*  GLUCOSE 143* 89 95 106* 86  BUN 39* 39* 41* 42* 40*  CREATININE 3.94* 3.94* 3.77* 3.85* 3.23*  CALCIUM 7.5* 7.4* 7.5* 7.6* 8.3*   GFR: Estimated Creatinine Clearance: 22.1 mL/min (A) (by C-G formula based on SCr of 3.23 mg/dL (H)). Liver Function Tests: Recent Labs  Lab 05/05/24 1421  AST 27  ALT 14  ALKPHOS 62  BILITOT 0.4  PROT 6.9  ALBUMIN 2.8*   No results for input(s): LIPASE, AMYLASE in the last 168 hours.  No results for input(s): AMMONIA in the last 168 hours. Coagulation Profile: No results for input(s): INR, PROTIME in the last 168 hours.  Cardiac Enzymes: Recent Labs  Lab 05/05/24 1421  CKTOTAL 30*   BNP (last 3 results) Recent Labs    05/01/24 1548  PROBNP 416.0*   HbA1C: No results for input(s): HGBA1C in the last 72 hours. CBG: No results for input(s): GLUCAP in the last 168 hours.  Lipid Profile: No results for input(s): CHOL, HDL, LDLCALC, TRIG, CHOLHDL, LDLDIRECT in the last 72 hours. Thyroid Function Tests: No results for input(s): TSH, T4TOTAL, FREET4, T3FREE, THYROIDAB in the last 72 hours. Anemia Panel: No results for input(s): VITAMINB12, FOLATE, FERRITIN, TIBC, IRON, RETICCTPCT in the last 72 hours. Sepsis Labs: No results for  input(s): PROCALCITON, LATICACIDVEN in the last 168 hours.   Recent Results (from the past 240 hours)  Culture, blood (routine x 2)     Status: None   Collection Time: 05/01/24 12:00 PM   Specimen: BLOOD  Result Value Ref Range Status   Specimen Description BLOOD BLOOD RIGHT ARM  Final   Special Requests   Final    BOTTLES DRAWN AEROBIC AND ANAEROBIC Blood Culture adequate volume   Culture   Final    NO GROWTH 5 DAYS Performed at [facility], [address] [place], [place] [address]    Report Status 05/06/2024 FINAL  Final  Culture, blood (routine x 2)     Status: None   Collection Time: 05/01/24 12:42 PM   Specimen: BLOOD  Result Value Ref Range Status   Specimen Description BLOOD BLOOD LEFT ARM  Final   Special Requests  Final    BOTTLES DRAWN AEROBIC AND ANAEROBIC Blood Culture adequate volume   Culture   Final    NO GROWTH 5 DAYS Performed at [facility], [address] [place] [place], [place] [address]    Report Status 05/06/2024 FINAL  Final  Urine Culture     Status: None   Collection Time: 05/01/24  4:15 PM   Specimen: Urine, Random  Result Value Ref Range Status   Specimen Description   Final    URINE, RANDOM Performed at [facility], [address] [place], [place] [address]    Special Requests   Final    NONE Reflexed from [id number] Performed at [facility], [address], [place], [place] [address]    Culture   Final    NO GROWTH Performed at [facility], [address] [place], [place] [address]    Report Status 05/03/2024 FINAL  Final  Resp panel by RT-PCR (RSV, Flu A&B, Covid) Anterior Nasal Swab     Status: None   Collection Time: 05/01/24  5:30 PM   Specimen: Anterior Nasal Swab  Result Value Ref Range Status   SARS Coronavirus 2 by RT PCR NEGATIVE NEGATIVE Final    Comment: (NOTE) SARS-CoV-2 target nucleic acids are NOT DETECTED.  The SARS-CoV-2 RNA is generally detectable in upper  respiratory specimens during the acute phase of infection. The lowest concentration of SARS-CoV-2 viral copies this assay can detect is 138 copies/mL. A negative result does not preclude SARS-Cov-2 infection and should not be used as the sole basis for treatment or other patient management decisions. A negative result may occur with  improper specimen collection/handling, submission of specimen other than nasopharyngeal swab, presence of viral mutation(s) within the areas targeted by this assay, and inadequate number of viral copies(<138 copies/mL). A negative result must be combined with clinical observations, patient history, and epidemiological information. The expected result is Negative.  Fact Sheet for Patients:  bloggercourse.com  Fact Sheet for Healthcare Providers:  seriousbroker.it  This test is no t yet approved or cleared by the [place]  FDA and  has been authorized for detection and/or diagnosis of SARS-CoV-2 by FDA under an Emergency Use Authorization (EUA). This EUA will remain  in effect (meaning this test can be used) for the duration of the COVID-19 declaration under Section 564(b)(1) of the Act, 21 U.S.C.section 360bbb-3(b)(1), unless the authorization is terminated  or revoked sooner.       Influenza A by PCR NEGATIVE NEGATIVE Final   Influenza B by PCR NEGATIVE NEGATIVE Final    Comment: (NOTE) The Xpert Xpress SARS-CoV-2/FLU/RSV plus assay is intended as an aid in the diagnosis of influenza from Nasopharyngeal swab specimens and should not be used as a sole basis for treatment. Nasal washings and aspirates are unacceptable for Xpert Xpress SARS-CoV-2/FLU/RSV testing.  Fact Sheet for Patients: bloggercourse.com  Fact Sheet for Healthcare Providers: seriousbroker.it  This test is not yet approved or cleared by the [place]  FDA and has been  authorized for detection and/or diagnosis of SARS-CoV-2 by FDA under an Emergency Use Authorization (EUA). This EUA will remain in effect (meaning this test can be used) for the duration of the COVID-19 declaration under Section 564(b)(1) of the Act, 21 U.S.C. section 360bbb-3(b)(1), unless the authorization is terminated or revoked.     Resp Syncytial Virus by PCR NEGATIVE NEGATIVE Final    Comment: (NOTE) Fact Sheet for Patients: bloggercourse.com  Fact Sheet for Healthcare Providers: seriousbroker.it  This  test is not yet approved or cleared by the [place]  FDA and has been authorized for detection and/or diagnosis of SARS-CoV-2 by FDA under an Emergency Use Authorization (EUA). This EUA will remain in effect (meaning this test can be used) for the duration of the COVID-19 declaration under Section 564(b)(1) of the Act, 21 U.S.C. section 360bbb-3(b)(1), unless the authorization is terminated or revoked.  Performed at [facility], [address], [place], [place] [address]          Radiology Studies: No results found.       Scheduled Meds:  amLODipine   10 mg Oral Daily   fluticasone  furoate-vilanterol  1 puff Inhalation Daily   folic acid   1 mg Oral Daily   furosemide   40 mg Intravenous Daily   heparin   5,000 Units Subcutaneous Q8H   lactulose   20 g Oral BID   levofloxacin   500 mg Oral Q48H   loratadine   10 mg Oral Daily   tamsulosin   0.4 mg Oral QPC supper   thiamine   100 mg Oral Daily   Continuous Infusions:     LOS: 9 days       Anthony CHRISTELLA Pouch, MD [facility] Pager [phone]  If 7PM-7AM, please contact night-coverage [email or web address]  05/10/2024, 9:06 AM   "

## 2024-05-10 NOTE — Progress Notes (Signed)
 Mobility Specialist Progress Note:    05/10/24 1624  Mobility  Activity Ambulated with assistance  Level of Assistance Modified independent, requires aide device or extra time  Assistive Device None  Distance Ambulated (ft) 320 ft  Range of Motion/Exercises Active;All extremities  Activity Response Tolerated well  Mobility visit 1 Mobility  Mobility Specialist Start Time (ACUTE ONLY) 1615  Mobility Specialist Stop Time (ACUTE ONLY) 1624  Mobility Specialist Time Calculation (min) (ACUTE ONLY) 9 min   Pt received ambulating on RA, requesting to check O2. SpO2 85% on RA, pt denies SOB and all symptoms. Assisted pt to room, placed back on 2L via [place] per RN. SpO2 91% on 2L after approximately 2 mins. Left pt in chair, all needs met.  Sherrilee Ditty Mobility Specialist Please contact via Special Educational Needs Teacher or  Rehab office at [phone]

## 2024-05-11 ENCOUNTER — Other Ambulatory Visit: Payer: Self-pay

## 2024-05-11 LAB — CBC
HCT: 23.6 % — ABNORMAL LOW (ref 39.0–52.0)
Hemoglobin: 7.8 g/dL — ABNORMAL LOW (ref 13.0–17.0)
MCH: 29.7 pg (ref 26.0–34.0)
MCHC: 33.1 g/dL (ref 30.0–36.0)
MCV: 89.7 fL (ref 80.0–100.0)
Platelets: 431 K/uL — ABNORMAL HIGH (ref 150–400)
RBC: 2.63 MIL/uL — ABNORMAL LOW (ref 4.22–5.81)
RDW: 16.3 % — ABNORMAL HIGH (ref 11.5–15.5)
WBC: 13.7 K/uL — ABNORMAL HIGH (ref 4.0–10.5)
nRBC: 0 % (ref 0.0–0.2)

## 2024-05-11 LAB — BASIC METABOLIC PANEL WITH GFR
Anion gap: 11 (ref 5–15)
BUN: 41 mg/dL — ABNORMAL HIGH (ref 8–23)
CO2: 21 mmol/L — ABNORMAL LOW (ref 22–32)
Calcium: 8.4 mg/dL — ABNORMAL LOW (ref 8.9–10.3)
Chloride: 104 mmol/L (ref 98–111)
Creatinine, Ser: 3.54 mg/dL — ABNORMAL HIGH (ref 0.61–1.24)
GFR, Estimated: 18 mL/min — ABNORMAL LOW
Glucose, Bld: 94 mg/dL (ref 70–99)
Potassium: 4.1 mmol/L (ref 3.5–5.1)
Sodium: 137 mmol/L (ref 135–145)

## 2024-05-11 MED ORDER — LEVOFLOXACIN 750 MG PO TABS
750.0000 mg | ORAL_TABLET | ORAL | 0 refills | Status: AC
  Filled 2024-05-11: qty 3, 6d supply, fill #0

## 2024-05-11 MED ORDER — FOLIC ACID 1 MG PO TABS
1.0000 mg | ORAL_TABLET | Freq: Every day | ORAL | 0 refills | Status: AC
  Filled 2024-05-11: qty 30, 30d supply, fill #0

## 2024-05-11 MED ORDER — LEVOFLOXACIN 750 MG PO TABS
750.0000 mg | ORAL_TABLET | Freq: Every day | ORAL | 0 refills | Status: DC
  Filled 2024-05-11: qty 3, 3d supply, fill #0

## 2024-05-11 MED ORDER — LACTULOSE 10 GM/15ML PO SOLN
20.0000 g | Freq: Two times a day (BID) | ORAL | 0 refills | Status: DC | PRN
  Filled 2024-05-11: qty 236, 4d supply, fill #0

## 2024-05-11 MED ORDER — TORSEMIDE 20 MG PO TABS
20.0000 mg | ORAL_TABLET | Freq: Every day | ORAL | 0 refills | Status: DC | PRN
  Filled 2024-05-11: qty 30, 30d supply, fill #0

## 2024-05-11 MED ORDER — VITAMIN B-1 100 MG PO TABS
100.0000 mg | ORAL_TABLET | Freq: Every day | ORAL | 0 refills | Status: DC
  Filled 2024-05-11: qty 30, 30d supply, fill #0

## 2024-05-11 NOTE — Consult Note (Signed)
 [facility] Consult Note: 05/11/2024     Date of Admission:  05/01/2024           Reason for Consult:  AKI   Referring Provider: Dorinda Drue DASEN, MD Primary Care Provider: Pcp, No   History of Presenting Illness:  Edward Macias is a 63 y.o. male With hypertension, asthma, COPD, history of alcohol abuse in the past, hepatitis C, cirrhosis, diverticulosis, aortic atherosclerosis presented on 05/01/2024.  He was previously admitted for acute pyelonephritis and treated with antibiotics, discharged on December 18.   He presented again for recurrent sepsis due to pyelonephritis.  He was admitted to the hospital for IV antibiotics.  Nephrology consult has been requested for evaluation of acute kidney injury. Patient's baseline creatinine appears to be 0.58 from 03/18/2024.  It was elevated at 1.76 at the time of this admission.  Since then it has increased and peaked at 3.94.  Today's creatinine is improved to 3.54 and stable since yesterday.  Review of records indicate that patient has received multiple CTs with IV contrast.  CT angiogram of the abdomen on December 13, CT abdomen pelvis with contrast on 05/01/2024. Today patient reports that he is close to his baseline health.  He has continuous oxygen at home 2 L.  He has 1+ bilateral lower extremity edema today.  He is able to eat without nausea or vomiting although not quite back to baseline.  He is requesting to go home.   Review of Systems: ROS Gen: Denies any fevers or chills HEENT: No vision or hearing problems CV: No chest pain or shortness of breath at present Resp: No cough or sputum production GI: No nausea, vomiting or diarrhea.  No blood in the stool GU : No problems with voiding.  No hematuria.  No previous history of kidney problems MS: Ambulatory.  Denies any acute joint pain or swelling Derm:   No complaints Psych: No complaints Heme: No complaints Neuro: No complaints Endocrine: No complaints   Past  Medical History:  Diagnosis Date   Arthritis    Asthma    AS A CHILD-NO INHALERS   Asthma exacerbation 04/09/2019   COPD (chronic obstructive pulmonary disease) (HCC)    Pneumonia    Sepsis (HCC) 07/24/2018    Social History[1]  History reviewed. No pertinent family history.   OBJECTIVE: Blood pressure 135/77, pulse 77, temperature 98.8 F (37.1 C), temperature source Oral, resp. rate 18, height 5' 9 (1.753 m), weight 67.6 kg, SpO2 100%.  Physical Exam General Appearance-sitting up in the chair, no acute distress HEENT-anicteric, moist oral mucous membranes Pulmonary-normal breathing effort, [place] O2 2 L, clear to auscultation Cardiac-regular rhythm, soft murmur Abdominal-nondistended, nontender Extremities-1+ pitting edema bilaterally Neuro-alert and oriented Skin-no acute rashes Musculoskeletal-no acute joint swellings or effusions noted.  Lab Results Lab Results  Component Value Date   WBC 13.7 (H) 05/11/2024   HGB 7.8 (L) 05/11/2024   HCT 23.6 (L) 05/11/2024   MCV 89.7 05/11/2024   PLT 431 (H) 05/11/2024    Lab Results  Component Value Date   CREATININE 3.54 (H) 05/11/2024   BUN 41 (H) 05/11/2024   NA 137 05/11/2024   K 4.1 05/11/2024   CL 104 05/11/2024   CO2 21 (L) 05/11/2024    Lab Results  Component Value Date   ALT 14 05/05/2024   AST 27 05/05/2024   ALKPHOS 62 05/05/2024   BILITOT 0.4 05/05/2024     Microbiology: Recent Results (from the past 240 hours)  Urine  Culture     Status: None   Collection Time: 05/01/24  4:15 PM   Specimen: Urine, Random  Result Value Ref Range Status   Specimen Description   Final    URINE, RANDOM Performed at [facility], [address] [place], [place] [address]    Special Requests   Final    NONE Reflexed from [id number] Performed at [facility], [address] [place], [place] [address]    Culture   Final    NO GROWTH Performed at [facility], [address], [place], [place]  [address]    Report Status 05/03/2024 FINAL  Final  Resp panel by RT-PCR (RSV, Flu A&B, Covid) Anterior Nasal Swab     Status: None   Collection Time: 05/01/24  5:30 PM   Specimen: Anterior Nasal Swab  Result Value Ref Range Status   SARS Coronavirus 2 by RT PCR NEGATIVE NEGATIVE Final    Comment: (NOTE) SARS-CoV-2 target nucleic acids are NOT DETECTED.  The SARS-CoV-2 RNA is generally detectable in upper respiratory specimens during the acute phase of infection. The lowest concentration of SARS-CoV-2 viral copies this assay can detect is 138 copies/mL. A negative result does not preclude SARS-Cov-2 infection and should not be used as the sole basis for treatment or other patient management decisions. A negative result may occur with  improper specimen collection/handling, submission of specimen other than nasopharyngeal swab, presence of viral mutation(s) within the areas targeted by this assay, and inadequate number of viral copies(<138 copies/mL). A negative result must be combined with clinical observations, patient history, and epidemiological information. The expected result is Negative.  Fact Sheet for Patients:  bloggercourse.com  Fact Sheet for Healthcare Providers:  seriousbroker.it  This test is no t yet approved or cleared by the [place]  FDA and  has been authorized for detection and/or diagnosis of SARS-CoV-2 by FDA under an Emergency Use Authorization (EUA). This EUA will remain  in effect (meaning this test can be used) for the duration of the COVID-19 declaration under Section 564(b)(1) of the Act, 21 U.S.C.section 360bbb-3(b)(1), unless the authorization is terminated  or revoked sooner.       Influenza A by PCR NEGATIVE NEGATIVE Final   Influenza B by PCR NEGATIVE NEGATIVE Final    Comment: (NOTE) The Xpert Xpress SARS-CoV-2/FLU/RSV plus assay is intended as an aid in the diagnosis of influenza from  Nasopharyngeal swab specimens and should not be used as a sole basis for treatment. Nasal washings and aspirates are unacceptable for Xpert Xpress SARS-CoV-2/FLU/RSV testing.  Fact Sheet for Patients: bloggercourse.com  Fact Sheet for Healthcare Providers: seriousbroker.it  This test is not yet approved or cleared by the [place]  FDA and has been authorized for detection and/or diagnosis of SARS-CoV-2 by FDA under an Emergency Use Authorization (EUA). This EUA will remain in effect (meaning this test can be used) for the duration of the COVID-19 declaration under Section 564(b)(1) of the Act, 21 U.S.C. section 360bbb-3(b)(1), unless the authorization is terminated or revoked.     Resp Syncytial Virus by PCR NEGATIVE NEGATIVE Final    Comment: (NOTE) Fact Sheet for Patients: bloggercourse.com  Fact Sheet for Healthcare Providers: seriousbroker.it  This test is not yet approved or cleared by the [place]  FDA and has been authorized for detection and/or diagnosis of SARS-CoV-2 by FDA under an Emergency Use Authorization (EUA). This EUA will remain in effect (meaning this test can be used) for the duration of the COVID-19 declaration  under Section 564(b)(1) of the Act, 21 U.S.C. section 360bbb-3(b)(1), unless the authorization is terminated or revoked.  Performed at [facility], [address] [place], [place] [address]     Medications: Scheduled Meds:  amLODipine   10 mg Oral Daily   fluticasone  furoate-vilanterol  1 puff Inhalation Daily   folic acid   1 mg Oral Daily   heparin   5,000 Units Subcutaneous Q8H   lactulose   20 g Oral BID   levofloxacin   500 mg Oral Q48H   loratadine   10 mg Oral Daily   tamsulosin   0.4 mg Oral QPC supper   thiamine   100 mg Oral Daily   Continuous Infusions: PRN Meds:.acetaminophen  **OR** acetaminophen , albuterol ,  morphine  injection, ondansetron  **OR** ondansetron  (ZOFRAN ) IV, oxyCODONE , simethicone   Allergies[2]  Urinalysis: No results for input(s): COLORURINE, LABSPEC, PHURINE, GLUCOSEU, HGBUR, BILIRUBINUR, KETONESUR, PROTEINUR, UROBILINOGEN, NITRITE, LEUKOCYTESUR in the last 72 hours.  Invalid input(s): APPERANCEUR    Imaging: No results found.    Assessment/Plan:  Dewarren Ledbetter is a 63 y.o. male with medical problems of  hypertension, asthma, COPD, history of alcohol abuse in the past, hepatitis C, cirrhosis, diverticulosis, aortic atherosclerosis     was admitted on 05/01/2024 for :  UTI (urinary tract infection) [N39.0]  Acute kidney injury Acute pyelonephritis Acute metabolic acidosis Anemia Hepatitis C Cirrhosis  Acute kidney injury likely multifactorial from concurrent sepsis and multiple IV contrast exposures.  Patient is currently nonoliguric.  Electrolytes and volume status is acceptable.  No acute indication for dialysis at present.  Avoid nonsteroidals and further IV contrast exposure.  Patient is requesting to go home.  We have obtained his cell phone number and we will follow-up with him early next week for labs and further assessment.  Recommend to start low-dose torsemide 20 mg p.o. daily as needed for lower extremity edema.  He has developed mild acute metabolic acidosis.  We will continue to monitor for now.  Antibiotics as per hospitalist and ID team.   Saralee Stank 05/11/2024     [1]  Social History Tobacco Use   Smoking status: Every Day    Current packs/day: 1.00    Average packs/day: 1 pack/day for 39.0 years (39.0 ttl pk-yrs)    Types: Cigarettes   Smokeless tobacco: Never  Vaping Use   Vaping status: Never Used  Substance Use Topics   Alcohol use: Yes    Comment: occasional   Drug use: No  [2] No Known Allergies
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
## 2024-05-11 NOTE — TOC Transition Note (Signed)
 Transition of Care [facility]) - Discharge Note   Patient Details  Name: Lansing Sigmon MRN: [id number] Date of Birth: 1960-05-19  [facility] CM/SW Contact:  Corean ONEIDA Haddock, RN Phone Number: 05/11/2024, 12:31 PM   Clinical Narrative:     Met with patient at bedside.  He states that MD informed him he would be discharging today Patient states that he has decided he does not need a cane at discharge, and if he changes his mind he will obtain one after discharge.  Patient has his portable concentrator at bedside for transport.  Patient states that a friend will be picking him up today at discharge         Patient Goals and CMS Choice            Discharge Placement                       Discharge Plan and Services Additional resources added to the After Visit Summary for                                       Social Drivers of Health (SDOH) Interventions SDOH Screenings   Food Insecurity: No Food Insecurity (05/01/2024)  Housing: Low Risk (05/01/2024)  Transportation Needs: No Transportation Needs (05/01/2024)  Utilities: Not At Risk (05/01/2024)  Social Connections: Socially Isolated (04/23/2024)  Tobacco Use: High Risk (05/01/2024)     Readmission Risk Interventions    05/03/2024    3:54 PM  Readmission Risk Prevention Plan  Transportation Screening Complete  PCP or Specialist Appt within 3-5 Days Complete  HRI or Home Care Consult Not Complete  HRI or Home Care Consult comments none  Social Work Consult for Recovery Care Planning/Counseling Not Complete  SW consult not completed comments n/a  Palliative Care Screening Not Applicable  Medication Review Oceanographer) Complete

## 2024-05-30 NOTE — Telephone Encounter (Addendum)
 Called patient and reviewed most recent lab results. I informed patient that kidney function came back at 19%, and when he was discharged from the hospital last month it was 18% so stable. I also advised him that his hemoglobin count is low suggesting anemia, so we will put a referral to hematology (at cancer center) for consideration of iron and injections to treat anemia. I lastly informed patient that potassium level is low in his blood therefore a prescription has been sent to his pharmacy at this time. Patient notes understanding and is in agreement with plan at this time.

## 2024-05-31 ENCOUNTER — Emergency Department

## 2024-05-31 ENCOUNTER — Inpatient Hospital Stay
Admission: EM | Admit: 2024-05-31 | Source: Home / Self Care | Attending: Internal Medicine | Admitting: Internal Medicine

## 2024-05-31 ENCOUNTER — Other Ambulatory Visit: Payer: Self-pay

## 2024-05-31 DIAGNOSIS — N179 Acute kidney failure, unspecified: Secondary | ICD-10-CM | POA: Diagnosis present

## 2024-05-31 DIAGNOSIS — I471 Supraventricular tachycardia, unspecified: Secondary | ICD-10-CM | POA: Diagnosis not present

## 2024-05-31 DIAGNOSIS — Z720 Tobacco use: Secondary | ICD-10-CM | POA: Diagnosis present

## 2024-05-31 DIAGNOSIS — R600 Localized edema: Secondary | ICD-10-CM | POA: Diagnosis present

## 2024-05-31 DIAGNOSIS — I10 Essential (primary) hypertension: Secondary | ICD-10-CM | POA: Diagnosis not present

## 2024-05-31 DIAGNOSIS — E876 Hypokalemia: Secondary | ICD-10-CM | POA: Diagnosis present

## 2024-05-31 DIAGNOSIS — F1090 Alcohol use, unspecified, uncomplicated: Secondary | ICD-10-CM | POA: Diagnosis present

## 2024-05-31 DIAGNOSIS — K5792 Diverticulitis of intestine, part unspecified, without perforation or abscess without bleeding: Secondary | ICD-10-CM | POA: Diagnosis present

## 2024-05-31 DIAGNOSIS — J449 Chronic obstructive pulmonary disease, unspecified: Secondary | ICD-10-CM | POA: Diagnosis present

## 2024-05-31 DIAGNOSIS — N5082 Scrotal pain: Secondary | ICD-10-CM | POA: Diagnosis not present

## 2024-05-31 DIAGNOSIS — R1032 Left lower quadrant pain: Principal | ICD-10-CM

## 2024-05-31 LAB — PROTIME-INR
INR: 1.2 (ref 0.8–1.2)
Prothrombin Time: 16.1 s — ABNORMAL HIGH (ref 11.4–15.2)

## 2024-05-31 LAB — COMPREHENSIVE METABOLIC PANEL WITH GFR
ALT: 9 U/L (ref 0–44)
AST: 25 U/L (ref 15–41)
Albumin: 3.4 g/dL — ABNORMAL LOW (ref 3.5–5.0)
Alkaline Phosphatase: 106 U/L (ref 38–126)
Anion gap: 17 — ABNORMAL HIGH (ref 5–15)
BUN: 28 mg/dL — ABNORMAL HIGH (ref 8–23)
CO2: 25 mmol/L (ref 22–32)
Calcium: 8.6 mg/dL — ABNORMAL LOW (ref 8.9–10.3)
Chloride: 97 mmol/L — ABNORMAL LOW (ref 98–111)
Creatinine, Ser: 3.07 mg/dL — ABNORMAL HIGH (ref 0.61–1.24)
GFR, Estimated: 22 mL/min — ABNORMAL LOW
Glucose, Bld: 102 mg/dL — ABNORMAL HIGH (ref 70–99)
Potassium: 3.4 mmol/L — ABNORMAL LOW (ref 3.5–5.1)
Sodium: 139 mmol/L (ref 135–145)
Total Bilirubin: 0.4 mg/dL (ref 0.0–1.2)
Total Protein: 8.4 g/dL — ABNORMAL HIGH (ref 6.5–8.1)

## 2024-05-31 LAB — LIPASE, BLOOD: Lipase: 57 U/L — ABNORMAL HIGH (ref 11–51)

## 2024-05-31 LAB — URINALYSIS, ROUTINE W REFLEX MICROSCOPIC
Bacteria, UA: NONE SEEN
Bilirubin Urine: NEGATIVE
Glucose, UA: 50 mg/dL — AB
Ketones, ur: NEGATIVE mg/dL
Nitrite: NEGATIVE
Protein, ur: >=300 mg/dL — AB
RBC / HPF: >50 RBC/hpf (ref 0–5)
Specific Gravity, Urine: 1.01 (ref 1.005–1.030)
pH: 7 (ref 5.0–8.0)

## 2024-05-31 LAB — TSH: TSH: 1.8 u[IU]/mL (ref 0.350–4.500)

## 2024-05-31 LAB — CBC
HCT: 26.8 % — ABNORMAL LOW (ref 39.0–52.0)
Hemoglobin: 8.6 g/dL — ABNORMAL LOW (ref 13.0–17.0)
MCH: 28.7 pg (ref 26.0–34.0)
MCHC: 32.1 g/dL (ref 30.0–36.0)
MCV: 89.3 fL (ref 80.0–100.0)
Platelets: 545 K/uL — ABNORMAL HIGH (ref 150–400)
RBC: 3 MIL/uL — ABNORMAL LOW (ref 4.22–5.81)
RDW: 16.9 % — ABNORMAL HIGH (ref 11.5–15.5)
WBC: 24.1 K/uL — ABNORMAL HIGH (ref 4.0–10.5)
nRBC: 0 % (ref 0.0–0.2)

## 2024-05-31 LAB — APTT: aPTT: 50 s — ABNORMAL HIGH (ref 24–36)

## 2024-05-31 LAB — CHLAMYDIA/NGC RT PCR (ARMC ONLY)
Chlamydia Tr: NOT DETECTED
N gonorrhoeae: NOT DETECTED

## 2024-05-31 LAB — LACTIC ACID, PLASMA: Lactic Acid, Venous: 1.9 mmol/L (ref 0.5–1.9)

## 2024-05-31 LAB — PHOSPHORUS: Phosphorus: 4.2 mg/dL (ref 2.5–4.6)

## 2024-05-31 LAB — T4, FREE: Free T4: 1.01 ng/dL (ref 0.80–2.00)

## 2024-05-31 LAB — MAGNESIUM: Magnesium: 1.2 mg/dL — ABNORMAL LOW (ref 1.7–2.4)

## 2024-05-31 LAB — PRO BRAIN NATRIURETIC PEPTIDE: Pro Brain Natriuretic Peptide: 3137 pg/mL — ABNORMAL HIGH

## 2024-05-31 MED ORDER — METOPROLOL TARTRATE 5 MG/5ML IV SOLN
5.0000 mg | Freq: Once | INTRAVENOUS | Status: AC
  Administered 2024-05-31: 5 mg via INTRAVENOUS
  Filled 2024-05-31: qty 5

## 2024-05-31 MED ORDER — ACETAMINOPHEN 325 MG PO TABS
650.0000 mg | ORAL_TABLET | Freq: Four times a day (QID) | ORAL | Status: DC | PRN
  Administered 2024-06-02 – 2024-06-03 (×2): 650 mg via ORAL
  Filled 2024-05-31 (×2): qty 2

## 2024-05-31 MED ORDER — HYDRALAZINE HCL 20 MG/ML IJ SOLN: 5.0000 mg | INTRAMUSCULAR | Status: DC | PRN

## 2024-05-31 MED ORDER — METOPROLOL TARTRATE 5 MG/5ML IV SOLN: 2.5000 mg | INTRAVENOUS | Status: DC | PRN

## 2024-05-31 MED ORDER — LACTULOSE 10 GM/15ML PO SOLN: 20.0000 g | Freq: Two times a day (BID) | ORAL | Status: DC | PRN

## 2024-05-31 MED ORDER — LACTATED RINGERS IV BOLUS
1000.0000 mL | Freq: Once | INTRAVENOUS | Status: AC
  Administered 2024-05-31: 1000 mL via INTRAVENOUS

## 2024-05-31 MED ORDER — METOPROLOL TARTRATE 5 MG/5ML IV SOLN
5.0000 mg | INTRAVENOUS | Status: DC | PRN
  Filled 2024-05-31 (×2): qty 5

## 2024-05-31 MED ORDER — METOPROLOL TARTRATE 25 MG PO TABS
25.0000 mg | ORAL_TABLET | Freq: Once | ORAL | Status: AC
  Administered 2024-05-31: 25 mg via ORAL
  Filled 2024-05-31: qty 1

## 2024-05-31 MED ORDER — FLUTICASONE FUROATE-VILANTEROL 200-25 MCG/ACT IN AEPB
1.0000 | INHALATION_SPRAY | Freq: Every day | RESPIRATORY_TRACT | Status: DC
  Administered 2024-06-01 – 2024-06-04 (×4): 1 via RESPIRATORY_TRACT
  Filled 2024-05-31: qty 28

## 2024-05-31 MED ORDER — THIAMINE MONONITRATE 100 MG PO TABS
100.0000 mg | ORAL_TABLET | Freq: Every day | ORAL | Status: DC
  Administered 2024-05-31 – 2024-06-04 (×5): 100 mg via ORAL
  Filled 2024-05-31 (×5): qty 1

## 2024-05-31 MED ORDER — METOPROLOL TARTRATE 25 MG PO TABS: 25.0000 mg | ORAL_TABLET | Freq: Two times a day (BID) | ORAL | Status: DC

## 2024-05-31 MED ORDER — PIPERACILLIN-TAZOBACTAM 3.375 G IVPB 30 MIN
3.3750 g | Freq: Once | INTRAVENOUS | Status: AC
  Administered 2024-05-31: 3.375 g via INTRAVENOUS
  Filled 2024-05-31: qty 50

## 2024-05-31 MED ORDER — MORPHINE SULFATE (PF) 2 MG/ML IV SOLN: 2.0000 mg | INTRAVENOUS | Status: DC | PRN

## 2024-05-31 MED ORDER — PIPERACILLIN-TAZOBACTAM 3.375 G IVPB
3.3750 g | Freq: Three times a day (TID) | INTRAVENOUS | Status: DC
  Administered 2024-06-01 – 2024-06-04 (×10): 3.375 g via INTRAVENOUS
  Filled 2024-05-31 (×10): qty 50

## 2024-05-31 MED ORDER — OXYCODONE HCL 5 MG PO TABS
5.0000 mg | ORAL_TABLET | Freq: Four times a day (QID) | ORAL | Status: DC | PRN
  Administered 2024-06-01 – 2024-06-02 (×3): 5 mg via ORAL
  Filled 2024-05-31 (×3): qty 1

## 2024-05-31 MED ORDER — DOXYCYCLINE HYCLATE 100 MG PO TABS
100.0000 mg | ORAL_TABLET | Freq: Two times a day (BID) | ORAL | Status: DC
  Administered 2024-05-31 – 2024-06-04 (×8): 100 mg via ORAL
  Filled 2024-05-31 (×8): qty 1

## 2024-05-31 MED ORDER — ENOXAPARIN SODIUM 30 MG/0.3ML IJ SOSY
30.0000 mg | PREFILLED_SYRINGE | INTRAMUSCULAR | Status: DC
  Administered 2024-05-31 – 2024-06-03 (×4): 30 mg via SUBCUTANEOUS
  Filled 2024-05-31 (×4): qty 0.3

## 2024-05-31 MED ORDER — NICOTINE 21 MG/24HR TD PT24
21.0000 mg | MEDICATED_PATCH | Freq: Every day | TRANSDERMAL | Status: DC
  Administered 2024-05-31 – 2024-06-04 (×5): 21 mg via TRANSDERMAL
  Filled 2024-05-31 (×5): qty 1

## 2024-05-31 MED ORDER — ONDANSETRON HCL 4 MG/2ML IJ SOLN: 4.0000 mg | Freq: Three times a day (TID) | INTRAMUSCULAR | Status: DC | PRN

## 2024-05-31 MED ORDER — IPRATROPIUM-ALBUTEROL 0.5-2.5 (3) MG/3ML IN SOLN
3.0000 mL | Freq: Three times a day (TID) | RESPIRATORY_TRACT | Status: DC
  Administered 2024-06-01 – 2024-06-03 (×5): 3 mL via RESPIRATORY_TRACT
  Filled 2024-05-31 (×6): qty 3

## 2024-05-31 MED ORDER — FOLIC ACID 1 MG PO TABS
1.0000 mg | ORAL_TABLET | Freq: Every day | ORAL | Status: DC
  Administered 2024-05-31 – 2024-06-04 (×5): 1 mg via ORAL
  Filled 2024-05-31 (×5): qty 1

## 2024-05-31 MED ORDER — DM-GUAIFENESIN ER 30-600 MG PO TB12: 1.0000 | ORAL_TABLET | Freq: Two times a day (BID) | ORAL | Status: DC | PRN

## 2024-05-31 MED ORDER — ENOXAPARIN SODIUM 40 MG/0.4ML IJ SOSY: 40.0000 mg | PREFILLED_SYRINGE | INTRAMUSCULAR | Status: DC

## 2024-05-31 MED ORDER — METOPROLOL TARTRATE 25 MG PO TABS
25.0000 mg | ORAL_TABLET | Freq: Two times a day (BID) | ORAL | Status: DC
  Administered 2024-06-01 – 2024-06-04 (×7): 25 mg via ORAL
  Filled 2024-05-31 (×7): qty 1

## 2024-05-31 MED ORDER — ALBUTEROL SULFATE HFA 108 (90 BASE) MCG/ACT IN AERS: 2.0000 | INHALATION_SPRAY | RESPIRATORY_TRACT | Status: DC | PRN

## 2024-05-31 MED ORDER — ALBUTEROL SULFATE (2.5 MG/3ML) 0.083% IN NEBU
2.5000 mg | INHALATION_SOLUTION | RESPIRATORY_TRACT | Status: DC | PRN
  Filled 2024-05-31: qty 3

## 2024-05-31 MED ORDER — POTASSIUM CHLORIDE CRYS ER 20 MEQ PO TBCR
60.0000 meq | EXTENDED_RELEASE_TABLET | Freq: Once | ORAL | Status: AC
  Administered 2024-05-31: 60 meq via ORAL
  Filled 2024-05-31: qty 3

## 2024-05-31 NOTE — ED Provider Notes (Signed)
 Patient received in signout from Dr. Willo pending UA and CT imaging.  Patient with history of recurrent pyelonephritis presents with lower abdominal pain, leukocytosis and intermittent SVT.  He is going in and out of this rapid heart rhythm, improved with IV and oral metoprolol , maintains hemodynamic stability without shock.  UA with trace leukocytes, no bacteria, will send for culture.  CT with signs of uncomplicated diverticulitis as more likely etiology of patient's pain.  Urine and blood cultures are drawn, provide Zosyn  for urologic and intra-abdominal coverage.  Consult medicine for admission .Critical Care  Performed by: Claudene Rover, MD Authorized by: Claudene Rover, MD   Critical care provider statement:    Critical care time (minutes):  30   Critical care time was exclusive of:  Separately billable procedures and treating other patients   Critical care was necessary to treat or prevent imminent or life-threatening deterioration of the following conditions:  Cardiac failure, circulatory failure and sepsis   Critical care was time spent personally by me on the following activities:  Development of treatment plan with patient or surrogate, discussions with consultants, evaluation of patient's response to treatment, examination of patient, ordering and review of laboratory studies, ordering and review of radiographic studies, ordering and performing treatments and interventions, pulse oximetry, re-evaluation of patient's condition and review of old charts .1-3 Lead EKG Interpretation  Performed by: Claudene Rover, MD Authorized by: Claudene Rover, MD     Interpretation: abnormal     ECG rate:  154   ECG rate assessment: tachycardic     Rhythm: SVT     Ectopy: none     Conduction: normal        Claudene Rover, MD 05/31/24 [id number]

## 2024-05-31 NOTE — H&P (Signed)
 " History and Physical    Edward Macias FMW:[id number] DOB: [birth year] DOA: 05/31/2024  Referring MD/NP/PA:   PCP: Pcp, No   Patient coming from:  The patient is coming from home.     Chief Complaint: Abdominal pain and testicular pain  HPI: Hence Edward Macias is a 64 y.o. male with medical history significant of HTN, COPD/asthma, HCV, recurrent UTI, E. coli septicemia, renal and perinephritic abscess, tobacco abuse, alcohol abuse (not drinking for more than 3 weeks), recent admission due to sepsis secondary to pyelonephritis 12/21 - 12/31, who presents with abdominal pain and testicular pain.  Patient states his abdominal pain started yesterday, which is located in the left lower quadrant, constant, aching, moderate, nonradiating, not associated with nausea vomiting or diarrhea.  No fever or chills.  He also complains of bilateral testicular pain, denies symptoms of UTI.  No testicular swelling or redness.  Patient has some bilateral lower leg edema.  Patient states he he did not drink any alcohol for more than 3 weeks.  Pt was found to have SVT with heart rate up to 170s.  Patient was given IV metoprolol  5 mg x 2 doses and oral metoprolol  25 mg in ED, converted to sinus rhythm with heart rate 80-90s when I saw patient in ED.  Patient denies chest pain, cough, SOB.  No palpitation.   Data reviewed independently and ED Course: pt was found to have WBC 24.1, lactic acid 1.9, UA (hazy appearance, trace amount of leukocyte, negative bacteria, WBC 25-50).  Temperature normal, oxygen saturation 100% room air, blood pressure 107/62, RR 30 --> 16.  Patient is admitted to PCU as inpatient.  Scrotal ultrasound: 1. Unremarkable testicles. 2. Bilateral epididymal cysts, right greater than left. Slight enlargement of the right epididymis. Clinical correlation recommended to exclude epididymitis.    CT abdomen/pelvis: 1. Early active diverticulitis in the distal descending/proximal sigmoid colon with  slight inflammatory stranding. 2. Left colonic diverticulosis.  EKG: I have personally reviewed.  SVT, QTc 458, heart rate of 157, borderline left axis deviation, anteroseptal infarction pattern   Review of Systems:   General: no fevers, chills, no body weight gain, has poor appetite, has fatigue HEENT: no blurry vision, hearing changes or sore throat Respiratory: no dyspnea, coughing, wheezing CV: no chest pain, no palpitations GI: no nausea, vomiting, has left lower abdominal pain, no diarrhea, constipation GU: no dysuria, burning on urination, increased urinary frequency, hematuria.  Has bilateral testicular pain. Ext: has leg edema Neuro: no unilateral weakness, numbness, or tingling, no vision change or hearing loss Skin: no rash, no skin tear. MSK: No muscle spasm, no deformity, no limitation of range of movement in spin Heme: No easy bruising.  Travel history: No recent long distant travel.   Allergy: Allergies[1]  Past Medical History:  Diagnosis Date   Arthritis    Asthma    AS A CHILD-NO INHALERS   Asthma exacerbation 04/09/2019   COPD (chronic obstructive pulmonary disease) (HCC)    Pneumonia    Sepsis (HCC) 07/24/2018    Past Surgical History:  Procedure Laterality Date   INGUINAL HERNIA REPAIR Left 10/03/2016   Procedure: HERNIA REPAIR INGUINAL ADULT;  Surgeon: Claudene Larinda Bolder, MD;  Location: [facility] ORS;  Service: General;  Laterality: Left;   INSERTION OF MESH Bilateral 03/19/2022   Procedure: INSERTION OF MESH;  Surgeon: Lane Shope, MD;  Location: [facility] ORS;  Service: General;  Laterality: Bilateral;    Social History:  reports that he has been smoking cigarettes. He has a  39 pack-year smoking history. He has never used smokeless tobacco. He reports current alcohol use. He reports that he does not use drugs.  Family History:  Family History  Problem Relation Age of Onset   Pancreatic cancer Sister    Pancreatic cancer Brother      Prior to  Admission medications  Medication Sig Start Date End Date Taking? Authorizing Provider  acetaminophen  (TYLENOL ) 325 MG tablet Take 650 mg by mouth every 6 (six) hours as needed for mild pain (pain score 1-3). 04/30/24   [provider]  albuterol  (VENTOLIN  HFA) 108 (90 Base) MCG/ACT inhaler Inhale 2 puffs into the lungs every 6 (six) hours as needed for wheezing or shortness of breath. 03/19/24   Kadali, Renuka A, MD  amLODipine  (NORVASC ) 10 MG tablet Take 1 tablet (10 mg total) by mouth daily. 04/28/24 05/28/24  Lanetta Lingo, MD  fluticasone -salmeterol (ADVAIR ) 250-50 MCG/ACT AEPB Inhale 1 puff into the lungs daily. 04/28/24 05/28/24  Lanetta Lingo, MD  folic acid  (FOLVITE ) 1 MG tablet Take 1 tablet (1 mg total) by mouth daily. 05/12/24   Dorinda Drue DASEN, MD  ipratropium-albuterol  (DUONEB) 0.5-2.5 (3) MG/3ML SOLN Take 3 mLs by nebulization 3 (three) times daily. 04/28/24 06/07/24  Lanetta Lingo, MD  lactulose  (CHRONULAC ) 10 GM/15ML solution Take 30 mLs (20 g total) by mouth 2 (two) times daily as needed for mild constipation or moderate constipation. 05/11/24   Dorinda Drue DASEN, MD  thiamine  (VITAMIN B-1) 100 MG tablet Take 1 tablet (100 mg total) by mouth daily. 05/12/24   Dorinda Drue DASEN, MD  torsemide  (DEMADEX ) 20 MG tablet Take 1 tablet (20 mg total) by mouth daily as needed (edema). 05/11/24   Dorinda Drue DASEN, MD    Physical Exam: Vitals:   05/31/24 1730 05/31/24 1800 05/31/24 1830 05/31/24 1900  BP: (!) 130/98 107/62 (!) 144/76 131/79  Pulse: (!) 150 92 80 81  Resp: (!) 22 16 18  (!) 21  Temp:      SpO2: 100% 100% 100% 100%  Weight:      Height:       General: Not in acute distress HEENT:       Eyes: PERRL, EOMI, no jaundice       ENT: No discharge from the ears and nose, no pharynx injection, no tonsillar enlargement.        Neck: No JVD, no bruit, no mass felt. Heme: No neck lymph node enlargement. Cardiac: S1/S2, RRR, No murmurs, No gallops or rubs. Respiratory: No  rales, wheezing, rhonchi or rubs. GI: Soft, nondistended, has tenderness to left lower quadrant, no rebound pain, no organomegaly, BS present. GU: has tenderness in the whole scrotum, no redness or swelling, no penile discharge. Ext: has 1+ leg edema bilaterally. 1+DP/PT pulse bilaterally. Musculoskeletal: No joint deformities, No joint redness or warmth, no limitation of ROM in spin. Skin: No rashes.  Neuro: Alert, oriented X3, cranial nerves II-XII grossly intact, moves all extremities normally.  Psych: Patient is not psychotic, no suicidal or hemocidal ideation.  Labs on Admission: I have personally reviewed following labs and imaging studies  CBC: Recent Labs  Lab 05/31/24 1413  WBC 24.1*  HGB 8.6*  HCT 26.8*  MCV 89.3  PLT 545*   Basic Metabolic Panel: Recent Labs  Lab 05/31/24 1429  NA 139  K 3.4*  CL 97*  CO2 25  GLUCOSE 102*  BUN 28*  CREATININE 3.07*  CALCIUM 8.6*   GFR: Estimated Creatinine Clearance: 22.6 mL/min (A) (by C-G formula  based on SCr of 3.07 mg/dL (H)). Liver Function Tests: Recent Labs  Lab 05/31/24 1429  AST 25  ALT 9  ALKPHOS 106  BILITOT 0.4  PROT 8.4*  ALBUMIN 3.4*   Recent Labs  Lab 05/31/24 1429  LIPASE 57*   No results for input(s): AMMONIA in the last 168 hours. Coagulation Profile: No results for input(s): INR, PROTIME in the last 168 hours. Cardiac Enzymes: No results for input(s): CKTOTAL, CKMB, CKMBINDEX, TROPONINI in the last 168 hours. BNP (last 3 results) Recent Labs    05/01/24 1548  PROBNP 416.0*   HbA1C: No results for input(s): HGBA1C in the last 72 hours. CBG: No results for input(s): GLUCAP in the last 168 hours. Lipid Profile: No results for input(s): CHOL, HDL, LDLCALC, TRIG, CHOLHDL, LDLDIRECT in the last 72 hours. Thyroid Function Tests: No results for input(s): TSH, T4TOTAL, FREET4, T3FREE, THYROIDAB in the last 72 hours. Anemia Panel: No results for  input(s): VITAMINB12, FOLATE, FERRITIN, TIBC, IRON, RETICCTPCT in the last 72 hours. Urine analysis:    Component Value Date/Time   COLORURINE AMBER (A) 05/31/2024 1458   APPEARANCEUR HAZY (A) 05/31/2024 1458   APPEARANCEUR Clear 05/16/2020 1009   LABSPEC 1.010 05/31/2024 1458   PHURINE 7.0 05/31/2024 1458   GLUCOSEU 50 (A) 05/31/2024 1458   HGBUR LARGE (A) 05/31/2024 1458   BILIRUBINUR NEGATIVE 05/31/2024 1458   BILIRUBINUR Negative 05/16/2020 1009   KETONESUR NEGATIVE 05/31/2024 1458   PROTEINUR >=300 (A) 05/31/2024 1458   NITRITE NEGATIVE 05/31/2024 1458   LEUKOCYTESUR TRACE (A) 05/31/2024 1458   Sepsis Labs: @LABRCNTIP (procalcitonin:4,lacticidven:4) )No results found for this or any previous visit (from the past 240 hours).   Radiological Exams on Admission:   Assessment/Plan Principal Problem:   Acute diverticulitis Active Problems:   Scrotal pain   SVT (supraventricular tachycardia)   HTN (hypertension)   COPD (chronic obstructive pulmonary disease) (HCC)   Hypokalemia   AKI (acute kidney injury)   Bilateral leg edema   Tobacco abuse   Alcohol use disorder   Assessment and Plan:  Acute diverticulitis: CT scan showed early active diverticulitis in the distal descending/proximal sigmoid colon with slight inflammatory stranding. Pt has leukocytosis with WBC 24.1, but no fever, lactic acid normal 1.9, clinically does not seem to have sepsis.  -Admitted to PCU as inpatient - IV Zosyn  - Follow-up blood culture - As needed Zofran  - As needed morphine , Percocet, Tylenol  for pain  Scrotal pain: Etiology is not clear.  Urinalysis showed hazy appearance, trace amount of leukocyte, negative bacteria, WBC 21-50.  No symptoms of UTI.  Scrotum ultrasound showed slight enlargement of the right epididymis. -pt is on Zosyn  as above - Start doxycycline  empirically 100 mg twice daily - Check GC chlamydia  SVT (supraventricular tachycardia): Etiology is not clear.   Initial heart rate of 170s, converted to sinus rhythm with heart rate 80-90s after treated with metoprolol . - metoprolol  25 mg twice daily orally - Check TSH and free T4 - Follow-up 2D echo - As needed IV metoprolol  5 mg every 2 hour for heart rate> 125  HTN (hypertension) -On metoprolol  25 mg twice daily as above - Hold amlodipine  since patient is started on metoprolol  - Hold Lasix  due to AKI  COPD (chronic obstructive pulmonary disease) (HCC): Stable -Bronchodilators and as needed Mucinex   Hypokalemia -Repleted potassium - Check magnesium  - Phosphorus level  AKI (acute kidney injury): Patient had GFR> 60 and creatinine 0.96 on 03/18/2024.  He had an AKI due to pyelonephritis with creatinine  up to 3.95 on 05/06/2024 --> 3.54 on 05/11/2024 --> 3.07 today.  BUN 28 and GFR 22 today.  His AKI has not resolved yet. - IV fluids above - Hold torsemide  as above  Bilateral leg edema: Etiology is not clear.  Pt was suspected to have possible CHF in the past, but no 2D echo on record. -Follow-up 2D echo - check BNP  Tobacco abuse -Nicotine  patch -Counseling about importance of quitting smoking  Alcohol use disorder: Patient states that he did not drink alcohol for more than 3 weeks.  No signs for withdrawal. - give vitamin B1 and folic acid       DVT ppx: SQ Lovenox   Code Status: Full code   Family Communication:     not done, no family member is at bed side.     Disposition Plan:  Anticipate discharge back to previous environment  Consults called:  none  Admission status and Level of care: Progressive: as inpt        Dispo: The patient is from: Home              Anticipated d/c is to: Home              Anticipated d/c date is: 2 days              Patient currently is not medically stable to d/c.    Severity of Illness:  The appropriate patient status for this patient is INPATIENT. Inpatient status is judged to be reasonable and necessary in order to provide the  required intensity of service to ensure the patient's safety. The patient's presenting symptoms, physical exam findings, and initial radiographic and laboratory data in the context of their chronic comorbidities is felt to place them at high risk for further clinical deterioration. Furthermore, it is not anticipated that the patient will be medically stable for discharge from the hospital within 2 midnights of admission.   * I certify that at the point of admission it is my clinical judgment that the patient will require inpatient hospital care spanning beyond 2 midnights from the point of admission due to high intensity of service, high risk for further deterioration and high frequency of surveillance required.*       Date of Service 05/31/2024    Caleb Exon [facility]   If 7PM-7AM, please contact night-coverage [email or web address] 05/31/2024, 9:20 PM     [1] No Known Allergies  "

## 2024-05-31 NOTE — ED Notes (Signed)
 Patient transported to CT

## 2024-05-31 NOTE — ED Triage Notes (Signed)
 Pt to ED for LLQ and testicular pain for a couple weeks. Denies swelling to testicles, reports redness.

## 2024-05-31 NOTE — ED Provider Notes (Signed)
 "  [facility] Provider Note    Event Date/Time   First MD Initiated Contact with Patient 05/31/24 1415     (approximate)   History   Chief Complaint Abdominal Pain and Testicle Pain   HPI  Edward Macias is a 64 y.o. male with past medical history of asthma, COPD, alcohol abuse, and pyelonephritis who presents to the ED complaining of abdominal pain.  Patient reports that he has had about 24 hours of increasing pain in the left lower quadrant of his abdomen as well as in both testicles.  He has not noticed any swelling in his testicles and denies any difficulty urinating.  He does state that he has had frequent urination recently but denies any dysuria, fever, or flank pain.  He has been eating and drinking normally and denies any changes in his bowel movements.  He did have multiple admissions last month due to sepsis due to pyelonephritis.  Heart rate noted to be significantly elevated in triage but he denies any palpitations, chest pain, or shortness of breath.     Physical Exam   Triage Vital Signs: ED Triage Vitals  Encounter Vitals Group     BP 05/31/24 1411 (!) 135/98     Girls Systolic BP Percentile --      Girls Diastolic BP Percentile --      Boys Systolic BP Percentile --      Boys Diastolic BP Percentile --      Pulse Rate 05/31/24 1411 (!) 168     Resp 05/31/24 1411 20     Temp 05/31/24 1411 98.7 F (37.1 C)     Temp src --      SpO2 05/31/24 1411 95 %     Weight 05/31/24 1412 145 lb (65.8 kg)     Height 05/31/24 1412 5' 9 (1.753 m)     Head Circumference --      Peak Flow --      Pain Score 05/31/24 1412 9     Pain Loc --      Pain Education --      Exclude from Growth Chart --     Most recent vital signs: Vitals:   05/31/24 1500 05/31/24 1530  BP: (!) 138/96 (!) 136/98  Pulse:  (!) 157  Resp: (!) 21 19  Temp:    SpO2: 100% 96%    Constitutional: Alert and oriented. Eyes: Conjunctivae are normal. Head: Atraumatic. Nose:  No congestion/rhinnorhea. Mouth/Throat: Mucous membranes are moist.  Cardiovascular: Normal rate, regular rhythm. Grossly normal heart sounds.  2+ radial pulses bilaterally. Respiratory: Normal respiratory effort.  No retractions. Lungs CTAB. Gastrointestinal: Soft and tender to palpation in the left lower quadrant with no rebound or guarding. No distention. Genitourinary: Bilateral testicular tenderness noted without associated edema, erythema, or warmth. Musculoskeletal: No lower extremity tenderness nor edema.  Neurologic:  Normal speech and language. No gross focal neurologic deficits are appreciated.    ED Results / Procedures / Treatments   Labs (all labs ordered are listed, but only abnormal results are displayed) Labs Reviewed  CBC - Abnormal; Notable for the following components:      Result Value   WBC 24.1 (*)    RBC 3.00 (*)    Hemoglobin 8.6 (*)    HCT 26.8 (*)    RDW 16.9 (*)    Platelets 545 (*)    All other components within normal limits  COMPREHENSIVE METABOLIC PANEL WITH GFR - Abnormal; Notable for the following  components:   Potassium 3.4 (*)    Chloride 97 (*)    Glucose, Bld 102 (*)    BUN 28 (*)    Creatinine, Ser 3.07 (*)    Calcium 8.6 (*)    Total Protein 8.4 (*)    Albumin 3.4 (*)    GFR, Estimated 22 (*)    Anion gap 17 (*)    All other components within normal limits  LIPASE, BLOOD - Abnormal; Notable for the following components:   Lipase 57 (*)    All other components within normal limits  CULTURE, BLOOD (ROUTINE X 2)  CULTURE, BLOOD (ROUTINE X 2)  URINALYSIS, ROUTINE W REFLEX MICROSCOPIC  LACTIC ACID, PLASMA  LACTIC ACID, PLASMA     EKG  ED ECG REPORT I, Carlin Palin, the attending physician, personally viewed and interpreted this ECG.   Date: 05/31/2024  EKG Time: 14:19  Rate: 157  Rhythm: SVT  Axis: Normal  Intervals:none  ST&T Change: None  PROCEDURES:  Critical Care performed: Yes, see critical care procedure  note(s)  .Critical Care  Performed by: Palin Carlin, MD Authorized by: Palin Carlin, MD   Critical care provider statement:    Critical care time (minutes):  30   Critical care time was exclusive of:  Separately billable procedures and treating other patients and teaching time   Critical care was necessary to treat or prevent imminent or life-threatening deterioration of the following conditions:  Cardiac failure and sepsis   Critical care was time spent personally by me on the following activities:  Development of treatment plan with patient or surrogate, discussions with consultants, evaluation of patient's response to treatment, examination of patient, ordering and review of laboratory studies, ordering and review of radiographic studies, ordering and performing treatments and interventions, pulse oximetry, re-evaluation of patient's condition and review of old charts   I assumed direction of critical care for this patient from another provider in my specialty: no      MEDICATIONS ORDERED IN ED: Medications  lactated ringers  bolus 1,000 mL (1,000 mLs Intravenous New Bag/Given 05/31/24 1430)  metoprolol  tartrate (LOPRESSOR ) injection 5 mg (5 mg Intravenous Given 05/31/24 1528)     IMPRESSION / MDM / ASSESSMENT AND PLAN / ED COURSE  I reviewed the triage vital signs and the nursing notes.                              64 y.o. male with past medical history of asthma, COPD, and pyelonephritis who presents to the ED complaining of increasing left lower quadrant abdominal pain and testicular pain over the past 24 hours.  Patient's presentation is most consistent with acute presentation with potential threat to life or bodily function.  Differential diagnosis includes, but is not limited to, sepsis, pyelonephritis, cystitis, epididymitis, prostatitis, arrhythmia, ACS, anemia, electrolyte abnormality, AKI, kidney stone, diverticulitis.  Patient nontoxic-appearing and in no acute  distress, vital signs initially remarkable for significant tachycardia into the 160s but otherwise reassuring.  Initial EKG showed SVT without ischemic changes, but when I evaluated the patient shortly afterwards, his heart rate had normalized back to a normal sinus rhythm.  Labs with leukocytosis and sepsis workup initiated, anemia stable compared to previous.  Renal function also stable compared to previous without electrolyte abnormality or LFT abnormality.  Urinalysis pending, will further assess with CT imaging of his abdomen/pelvis as well as testicular ultrasound.  Patient noted to be in and out of what  appears to be SVT on the cardiac monitor.  Vagal maneuvers attempted without any change, given it is present intermittently will hold off on adenosine and instead treat with IV metoprolol .  Patient turned over to on-call provider pending additional results and likely admission.      FINAL CLINICAL IMPRESSION(S) / ED DIAGNOSES   Final diagnoses:  Left lower quadrant abdominal pain  SVT (supraventricular tachycardia)     Rx / DC Orders   ED Discharge Orders     None        Note:  This document was prepared using Dragon voice recognition software and may include unintentional dictation errors.   Willo Dunnings, MD 05/31/24 1547  "

## 2024-06-01 ENCOUNTER — Inpatient Hospital Stay

## 2024-06-01 ENCOUNTER — Inpatient Hospital Stay (HOSPITAL_COMMUNITY)
Admit: 2024-06-01 | Discharge: 2024-06-01 | Disposition: A | Discharge status: Home / Self Care | Attending: Internal Medicine | Admitting: Internal Medicine

## 2024-06-01 DIAGNOSIS — K5792 Diverticulitis of intestine, part unspecified, without perforation or abscess without bleeding: Secondary | ICD-10-CM | POA: Diagnosis present

## 2024-06-01 DIAGNOSIS — I509 Heart failure, unspecified: Secondary | ICD-10-CM | POA: Diagnosis not present

## 2024-06-01 DIAGNOSIS — F1090 Alcohol use, unspecified, uncomplicated: Secondary | ICD-10-CM | POA: Diagnosis not present

## 2024-06-01 DIAGNOSIS — N179 Acute kidney failure, unspecified: Secondary | ICD-10-CM | POA: Diagnosis not present

## 2024-06-01 DIAGNOSIS — I471 Supraventricular tachycardia, unspecified: Secondary | ICD-10-CM | POA: Diagnosis not present

## 2024-06-01 LAB — IRON AND TIBC
Iron: 13 ug/dL — ABNORMAL LOW (ref 45–182)
Saturation Ratios: 8 % — ABNORMAL LOW (ref 17.9–39.5)
TIBC: 151 ug/dL — ABNORMAL LOW (ref 250–450)
UIBC: 139 ug/dL

## 2024-06-01 LAB — ECHOCARDIOGRAM COMPLETE
Area-P 1/2: 3.99 cm2
Height: 69 in
S' Lateral: 2.9 cm
Weight: 2320 [oz_av]

## 2024-06-01 LAB — HEPATIC FUNCTION PANEL
ALT: 8 U/L (ref 0–44)
AST: 19 U/L (ref 15–41)
Albumin: 3.2 g/dL — ABNORMAL LOW (ref 3.5–5.0)
Alkaline Phosphatase: 94 U/L (ref 38–126)
Bilirubin, Direct: 0.2 mg/dL (ref 0.0–0.2)
Indirect Bilirubin: 0.2 mg/dL — ABNORMAL LOW (ref 0.3–0.9)
Total Bilirubin: 0.3 mg/dL (ref 0.0–1.2)
Total Protein: 8.1 g/dL (ref 6.5–8.1)

## 2024-06-01 LAB — VITAMIN B12: Vitamin B-12: 596 pg/mL (ref 180–914)

## 2024-06-01 LAB — CBC
HCT: 22.3 % — ABNORMAL LOW (ref 39.0–52.0)
Hemoglobin: 7.2 g/dL — ABNORMAL LOW (ref 13.0–17.0)
MCH: 28.8 pg (ref 26.0–34.0)
MCHC: 32.3 g/dL (ref 30.0–36.0)
MCV: 89.2 fL (ref 80.0–100.0)
Platelets: 494 K/uL — ABNORMAL HIGH (ref 150–400)
RBC: 2.5 MIL/uL — ABNORMAL LOW (ref 4.22–5.81)
RDW: 18 % — ABNORMAL HIGH (ref 11.5–15.5)
WBC: 21.5 K/uL — ABNORMAL HIGH (ref 4.0–10.5)
nRBC: 0 % (ref 0.0–0.2)

## 2024-06-01 LAB — FERRITIN: Ferritin: 611 ng/mL — ABNORMAL HIGH (ref 24–336)

## 2024-06-01 LAB — RETIC PANEL
Immature Retic Fract: 16.2 % — ABNORMAL HIGH (ref 2.3–15.9)
RBC.: 2.81 MIL/uL — ABNORMAL LOW (ref 4.22–5.81)
Retic Count, Absolute: 53.4 K/uL (ref 19.0–186.0)
Retic Ct Pct: 1.9 % (ref 0.4–3.1)
Reticulocyte Hemoglobin: 28.4 pg

## 2024-06-01 LAB — BASIC METABOLIC PANEL WITH GFR
Anion gap: 12 (ref 5–15)
Anion gap: 17 — ABNORMAL HIGH (ref 5–15)
BUN: 28 mg/dL — ABNORMAL HIGH (ref 8–23)
BUN: 28 mg/dL — ABNORMAL HIGH (ref 8–23)
CO2: 23 mmol/L (ref 22–32)
CO2: 21 mmol/L — ABNORMAL LOW (ref 22–32)
Calcium: 7.3 mg/dL — ABNORMAL LOW (ref 8.9–10.3)
Calcium: 9.1 mg/dL (ref 8.9–10.3)
Chloride: 104 mmol/L (ref 98–111)
Chloride: 99 mmol/L (ref 98–111)
Creatinine, Ser: 2.94 mg/dL — ABNORMAL HIGH (ref 0.61–1.24)
Creatinine, Ser: 3.31 mg/dL — ABNORMAL HIGH (ref 0.61–1.24)
GFR, Estimated: 23 mL/min — ABNORMAL LOW
GFR, Estimated: 20 mL/min — ABNORMAL LOW
Glucose, Bld: 85 mg/dL (ref 70–99)
Glucose, Bld: 109 mg/dL — ABNORMAL HIGH (ref 70–99)
Potassium: 3 mmol/L — ABNORMAL LOW (ref 3.5–5.1)
Potassium: 4.1 mmol/L (ref 3.5–5.1)
Sodium: 139 mmol/L (ref 135–145)
Sodium: 136 mmol/L (ref 135–145)

## 2024-06-01 LAB — FOLATE: Folate: >20 ng/mL

## 2024-06-01 LAB — LACTATE DEHYDROGENASE: LDH: 314 U/L — ABNORMAL HIGH (ref 105–235)

## 2024-06-01 MED ORDER — POTASSIUM CHLORIDE CRYS ER 20 MEQ PO TBCR
40.0000 meq | EXTENDED_RELEASE_TABLET | ORAL | Status: DC
  Administered 2024-06-01: 40 meq via ORAL
  Filled 2024-06-01: qty 2

## 2024-06-01 MED ORDER — POTASSIUM CHLORIDE 10 MEQ/100ML IV SOLN
10.0000 meq | Freq: Once | INTRAVENOUS | Status: AC
  Administered 2024-06-01: 10 meq via INTRAVENOUS
  Filled 2024-06-01: qty 100

## 2024-06-01 MED ORDER — MAGNESIUM SULFATE 2 GM/50ML IV SOLN
2.0000 g | Freq: Once | INTRAVENOUS | Status: AC
  Administered 2024-06-01: 2 g via INTRAVENOUS
  Filled 2024-06-01: qty 50

## 2024-06-01 MED ORDER — POTASSIUM CHLORIDE CRYS ER 20 MEQ PO TBCR
40.0000 meq | EXTENDED_RELEASE_TABLET | ORAL | Status: AC
  Administered 2024-06-01: 40 meq via ORAL
  Filled 2024-06-01: qty 2

## 2024-06-01 NOTE — Progress Notes (Signed)
" ° °  Brief Progress Note   _____________________________________________________________________________________________________________  Patient Name: Edward Macias Patient DOB: [birth year] Date: 06/01/24     Data: Patient was admitted on 1/20 at [facility]. V/S 136/80, HR 85, 100% on R/A. ECHO resulted with EF 55-60%     Action: Sent message to Dr Laurita to see if patient could be downgraded.     Response:  New LOC order placed.  _____________________________________________________________________________________________________________  [facility] Yuri Fana Please contact us  directly via secure chat (search for [facility]) or by calling us  at [phone] [facility]).  "

## 2024-06-01 NOTE — Progress Notes (Addendum)
 " Progress Note   Patient: Edward Macias FMW:[id number] DOB: [birth year] DOA: 05/31/2024     1 DOS: the patient was seen and examined on 06/01/2024   Brief hospital course: Edward Macias is a 64 y.o. male with medical history significant of HTN, COPD/asthma, HCV, recurrent UTI, E. coli septicemia, renal and perinephritic abscess, tobacco abuse, alcohol abuse (not drinking for more than 3 weeks), recent admission due to sepsis secondary to pyelonephritis 12/21 - 12/31, who presents with abdominal pain and testicular pain.  CT scan showed early Doppler colitis in the distal descending and proximal sigmoid colon. Patient was started on Zosyn .   Principal Problem:   Acute diverticulitis Active Problems:   Scrotal pain   SVT (supraventricular tachycardia)   HTN (hypertension)   COPD (chronic obstructive pulmonary disease) (HCC)   Hypokalemia   AKI (acute kidney injury)   Bilateral leg edema   Tobacco abuse   Alcohol use disorder   Hypomagnesemia   Assessment and Plan: Acute diverticulitis: CT scan showed early active diverticulitis in the distal descending/proximal sigmoid colon with slight inflammatory stranding. Pt has leukocytosis with WBC 24.1, but no fever, lactic acid normal 1.9, clinically does not seem to have sepsis. Blood cultures pending, patient was placed on Zosyn .  Will continue. Reviewed the chart, patient had a multiple admission with infection over the last 2 months.  HIV was negative in the last admission.  Will await for blood culture results.   Scrotal pain secondary to epididymitis:  Scrotum ultrasound showed evidence of epididymitis.  Will continue Zosyn  with added doxycycline .   SVT (supraventricular tachycardia): Etiology is not clear.  Initial heart rate of 170s, converted to sinus rhythm with heart rate 80-90s after treated with metoprolol . - metoprolol  25 mg twice daily orally - Condition has improved.  Patient can follow-up with cardiology as outpatient.   HTN  (hypertension) -On metoprolol  25 mg twice daily as above    COPD (chronic obstructive pulmonary disease) (HCC): Stable -Bronchodilators and as needed Mucinex    Hypokalemia Hypomagnesemia. AKI (acute kidney injury) Gross hematuria: Patient had GFR> 60 and creatinine 0.96 on 03/18/2024.  He had an AKI due to pyelonephritis with creatinine up to 3.95 on 05/06/2024 --> 3.54 on 05/11/2024 . Creatinine 2.94 today.  CT scan of the abdomen/pelvis did not show any hydronephrosis.  Will obtain bladder scan to rule out urinary retention. Repleted potassium and magnesium . Patient also has gross hematuria for the last 2 days, UA's showed large amount of red cells with hemoglobin, urine looks pink in color. Will obtain renal ultrasound.  Urine culture also pending. Patient does not appear to have an dehydration, hold off IV fluids for now. Repeated BMP showed worsening renal function, will consult nephrology.   Bilateral leg edema:  Patient has a mild elevation in proBNP associated with creatinine around 3.  No overall volume overload.  Hold off diuretics.  Echocardiogram pending.   Tobacco abuse -Nicotine  patch -Counseling about importance of quitting smoking   Alcohol use disorder: Patient states that he did not drink alcohol for more than 3 weeks.  No signs for withdrawal. - give vitamin B1 and folic acid   Severe anemia. Reactive thrombocytosis. Patient is starting to have anemia since last admission on 12/14, prior to that, hemoglobin was normal.  Hemoglobin further dropped down to 7.2 today, Patient has a lower iron, but elevated ferritin, also checked B12 and folic acid  level which are pending. Also check haptoglobin, LDH and hepatic panel, reticulocyte count to rule out hemolytic anemia. Patient  most likely has anemia of chronic disease.  Follow hemoglobin, transfuse with hemoglobin less than 7.       Subjective:  Patient is complaining gross hematuria, no dysuria or frequent  urination. Abdominal pain seems to be better.  Physical Exam: Vitals:   06/01/24 0630 06/01/24 0749 06/01/24 1020 06/01/24 1246  BP: 137/88 131/77 134/87   Pulse: 80 83 86   Resp: 19 18 16    Temp:  98.2 F (36.8 C)  98.3 F (36.8 C)  TempSrc:  Oral  Oral  SpO2: 100% 100% 99%   Weight:      Height:       General exam: Appears calm and comfortable  Respiratory system: Clear to auscultation. Respiratory effort normal. Cardiovascular system: S1 & S2 heard, RRR. No JVD, murmurs, rubs, gallops or clicks.  Bilateral lower extremity 1+ edema Gastrointestinal system: Abdomen is nondistended, soft and LLQ tender. No organomegaly or masses felt. Normal bowel sounds heard. Central nervous system: Alert and oriented. No focal neurological deficits. Extremities: Symmetric 5 x 5 power. Skin: No rashes, lesions or ulcers Psychiatry: Judgement and insight appear normal. Mood & affect appropriate.    Data Reviewed:  Reviewed CT scan results and lab results  Family Communication: None  Disposition: Status is: Inpatient Remains inpatient appropriate because: Severity of disease, IV treatment.     Time spent: 50 minutes  Author: Murvin Mana, MD 06/01/2024 2:19 PM  For on call review [email or web address].    "

## 2024-06-01 NOTE — Progress Notes (Signed)
 Patient admitted to PCU from ED. Upon arrival, patient placed on continuous cardiac telemetry monitoring. Vital signs obtained and documented. Patient oriented to room, call bell system, and unit routines. Safety measures implemented: bed in lowest position, call bell in reach, and side rails up x3. Patient is able reposition self. Initial head-to-toes assessment completed. Patient resting comfortably in bed, no acute distress noted at this time. Patient refused bed alarm, gown and grip socks. Educated on risk and patient verbalized understanding.

## 2024-06-01 NOTE — Hospital Course (Addendum)
 Edward Macias is a 64 y.o. male with medical history significant of HTN, COPD/asthma, HCV, recurrent UTI, E. coli septicemia, renal and perinephritic abscess, tobacco abuse, alcohol abuse (not drinking for more than 3 weeks), recent admission due to sepsis secondary to pyelonephritis 12/21 - 12/31, who presents with abdominal pain and testicular pain.  CT scan showed early Doppler colitis in the distal descending and proximal sigmoid colon. Patient was started on Zosyn . Patient condition has improved, abdominal pain resolved.  Medically stable for discharge. Patient has a worsening renal function since December, he did not improve at this admission.  Patient was seen by nephrology, no plan for inpatient biopsy due to active infection.  As a result, patient be followed up with nephrology as outpatient.

## 2024-06-02 ENCOUNTER — Telehealth: Payer: Self-pay | Admitting: Urology

## 2024-06-02 DIAGNOSIS — K5792 Diverticulitis of intestine, part unspecified, without perforation or abscess without bleeding: Secondary | ICD-10-CM | POA: Diagnosis not present

## 2024-06-02 DIAGNOSIS — N179 Acute kidney failure, unspecified: Secondary | ICD-10-CM | POA: Diagnosis not present

## 2024-06-02 DIAGNOSIS — F1090 Alcohol use, unspecified, uncomplicated: Secondary | ICD-10-CM | POA: Diagnosis not present

## 2024-06-02 LAB — BASIC METABOLIC PANEL WITH GFR
Anion gap: 13 (ref 5–15)
BUN: 28 mg/dL — ABNORMAL HIGH (ref 8–23)
CO2: 23 mmol/L (ref 22–32)
Calcium: 8.1 mg/dL — ABNORMAL LOW (ref 8.9–10.3)
Chloride: 102 mmol/L (ref 98–111)
Creatinine, Ser: 3.53 mg/dL — ABNORMAL HIGH (ref 0.61–1.24)
GFR, Estimated: 19 mL/min — ABNORMAL LOW
Glucose, Bld: 92 mg/dL (ref 70–99)
Potassium: 3.5 mmol/L (ref 3.5–5.1)
Sodium: 137 mmol/L (ref 135–145)

## 2024-06-02 LAB — URINE CULTURE

## 2024-06-02 LAB — CBC
HCT: 21.3 % — ABNORMAL LOW (ref 39.0–52.0)
Hemoglobin: 6.8 g/dL — ABNORMAL LOW (ref 13.0–17.0)
MCH: 28.7 pg (ref 26.0–34.0)
MCHC: 31.9 g/dL (ref 30.0–36.0)
MCV: 89.9 fL (ref 80.0–100.0)
Platelets: 458 K/uL — ABNORMAL HIGH (ref 150–400)
RBC: 2.37 MIL/uL — ABNORMAL LOW (ref 4.22–5.81)
RDW: 16.8 % — ABNORMAL HIGH (ref 11.5–15.5)
WBC: 15.5 K/uL — ABNORMAL HIGH (ref 4.0–10.5)
nRBC: 0 % (ref 0.0–0.2)

## 2024-06-02 LAB — ABO/RH: ABO/RH(D): A POS

## 2024-06-02 LAB — HEMOGLOBIN AND HEMATOCRIT, BLOOD
HCT: 25.5 % — ABNORMAL LOW (ref 39.0–52.0)
Hemoglobin: 8.1 g/dL — ABNORMAL LOW (ref 13.0–17.0)

## 2024-06-02 LAB — MAGNESIUM: Magnesium: 1.7 mg/dL (ref 1.7–2.4)

## 2024-06-02 LAB — PREPARE RBC (CROSSMATCH)

## 2024-06-02 LAB — PHOSPHORUS: Phosphorus: 4.8 mg/dL — ABNORMAL HIGH (ref 2.5–4.6)

## 2024-06-02 MED ORDER — SODIUM CHLORIDE 0.9% IV SOLUTION: Freq: Once | INTRAVENOUS | Status: AC

## 2024-06-02 NOTE — Discharge Instructions (Signed)
 Some PCP options in [place] area- not a comprehensive list  [facility]- [phone] [facility]- [phone] [facility]- [phone] [facility]- [phone] [facility]- [phone] Nichole Molly- [phone]  or [facility] Physician Referral Line [phone]

## 2024-06-02 NOTE — TOC Initial Note (Signed)
 Transition of Care [facility]) - Initial/Assessment Note    Patient Details  Name: Edward Macias MRN: [id number] Date of Birth: 1960/09/09  [facility]) CM/SW Contact:    Shasta DELENA Daring, RN Phone Number: 06/02/2024, 5:23 PM  Clinical Narrative:                 RNCM completed readmission risk assessment.  Patient lives alone in a duplex.  Drives himself to appointments. Uses [facility] on [address] for pharmacy. Says he is usually able to afford medications. No HH or DME.  Plans to call a friend for transport home.  No TOC needs at this time.    Expected Discharge Plan: Home/Self Care Barriers to Discharge: Continued Medical Work up   Patient Goals and CMS Choice            Expected Discharge Plan and Services                                              Prior Living Arrangements/Services   Lives with:: Self Patient language and need for interpreter reviewed:: Yes        Need for Family Participation in Patient Care: Yes (Comment) Care giver support system in place?: Yes (comment)   Criminal Activity/Legal Involvement Pertinent to Current Situation/Hospitalization: No - Comment as needed  Activities of Daily Living   ADL Screening (condition at time of admission) Independently performs ADLs?: Yes (appropriate for developmental age) Is the patient deaf or have difficulty hearing?: No Does the patient have difficulty seeing, even when wearing glasses/contacts?: No Does the patient have difficulty concentrating, remembering, or making decisions?: No  Permission Sought/Granted                  Emotional Assessment Appearance:: Appears stated age Attitude/Demeanor/Rapport: Gracious, Engaged Affect (typically observed): Appropriate Orientation: : Oriented to Self, Oriented to Place, Oriented to  Time, Oriented to Situation Alcohol / Substance Use: Not Applicable Psych Involvement: No (comment)  Admission diagnosis:  SVT  (supraventricular tachycardia) [I47.10] Acute diverticulitis [K57.92] Left lower quadrant abdominal pain [R10.32] Acute diverticulitis of intestine [K57.92] Patient Active Problem List   Diagnosis Date Noted   Acute diverticulitis of intestine 06/01/2024   Acute diverticulitis 05/31/2024   Scrotal pain 05/31/2024   HTN (hypertension) 05/31/2024   SVT (supraventricular tachycardia) 05/31/2024   Bilateral leg edema 05/31/2024   E coli bacteremia 05/09/2024   Hepatitis C infection 05/05/2024   Complicated UTI (urinary tract infection) 05/05/2024   Alcohol use disorder 05/02/2024   UTI (urinary tract infection) 05/01/2024   Renal and perinephric abscess 04/30/2024   Hypomagnesemia 04/26/2024   Hyponatremia 04/24/2024   Hypokalemia 04/24/2024   AKI (acute kidney injury) 04/24/2024   E. coli septicemia (HCC) 04/24/2024   Acute pyelonephritis 04/23/2024   Pyelonephritis 04/23/2024   COPD with acute exacerbation (HCC) 03/18/2024   COPD exacerbation (HCC) 03/03/2024   Right inguinal hernia 03/20/2022   Recurrent left inguinal hernia 03/06/2022   COPD (chronic obstructive pulmonary disease) (HCC) 04/17/2020   Tobacco abuse 04/17/2020   History of prediabetes 04/17/2020   Elevated lipids 12/13/2019   Encounter to establish care 11/24/2019   History of asthma 11/24/2019   Chronic right hip pain 11/24/2019   Peripheral neuropathy 04/10/2019   Wheezing 04/09/2019   Severe sepsis (HCC) 07/24/2018   PCP:  Pcp, No Pharmacy:   [facility]  [address] [place] - [address] [place] ([place]) [place] [address] Phone: [phone] Fax: [phone]  [facility] [address], [place] - [address] [place] [address] Phone: [phone] Fax: [phone]  [facility] [address] [place] [address] Phone: [phone] Fax: [phone]     Social Drivers of  Health (SDOH) Social History: SDOH Screenings   Food Insecurity: Patient Declined (06/01/2024)  Housing: Unknown (06/02/2024)  Transportation Needs: Patient Declined (06/01/2024)  Utilities: Patient Declined (06/01/2024)  Social Connections: Socially Isolated (04/23/2024)  Tobacco Use: High Risk (05/31/2024)   SDOH Interventions:     Readmission Risk Interventions    05/03/2024    3:54 PM  Readmission Risk Prevention Plan  Transportation Screening Complete  PCP or Specialist Appt within 3-5 Days Complete  HRI or Home Care Consult Not Complete  HRI or Home Care Consult comments none  Social Work Consult for Recovery Care Planning/Counseling Not Complete  SW consult not completed comments n/a  Palliative Care Screening Not Applicable  Medication Review Oceanographer) Complete

## 2024-06-02 NOTE — Progress Notes (Signed)
 " [facility]  ROUNDING NOTE   Subjective:   Edward Macias is a 64 y.o. male with past medical conditions to include hypertension, asthma, COPD, history of alcohol abuse in the past, hepatitis C, cirrhosis, diverticulosis, and aortic atherosclerosis. Patient presents to ED with Scrotal pain and has been admitted for SVT (supraventricular tachycardia) [I47.10] Acute diverticulitis [K57.92] Left lower quadrant abdominal pain [R10.32] Acute diverticulitis of intestine [K57.92]  Patient is known to our practice and is followed by Dr Dennise, last appt on 1/13. He states he was in his usual state of health up until the day before he was seen. Denies nausea, vomiting, and diarrhea. Denies shortness of breath. States the morning of presentation, he started having LLQ pain that extended to his testicles. Denies any trauma to area. Denies recent NSAID use.   Labs on ED arrival concerning for potassium 3.4, creatinine 3.07 with GFR 22, white count 24.1 with hemoglobin 8.6.  UA appears hazy with hematuria and proteinuria.  Urine culture inconclusive.  CT abdomen pelvis questionable for early active diverticulitis.  Renal ultrasound shows questionable cystitis.  We have been consulted to assist in management of acute kidney injury.   Objective:  Vital signs in last 24 hours:  Temp:  [97.5 F (36.4 C)-98.4 F (36.9 C)] 98.1 F (36.7 C) (01/22 1223) Pulse Rate:  [71-85] 72 (01/22 1223) Resp:  [16-18] 18 (01/22 1223) BP: (120-144)/(75-82) 132/75 (01/22 1223) SpO2:  [97 %-100 %] 100 % (01/22 1223) Weight:  [68.6 kg] 68.6 kg (01/22 0500)  Weight change: 2.828 kg Filed Weights   05/31/24 1412 06/02/24 0500  Weight: 65.8 kg 68.6 kg    Intake/Output: I/O last 3 completed shifts: In: 18 [IV Piggyback:50] Out: 2825 [Urine:2825]   Intake/Output this shift:  Total I/O In: 705.2 [P.O.:180; Blood:356.8; IV Piggyback:168.5] Out: 400 [Urine:400]  Physical Exam: General: NAD, resting  comfortably  Head: Normocephalic, atraumatic. Moist oral mucosal membranes  Eyes: Anicteric  Lungs:  Clear to auscultation, normal effort  Heart: Regular rate and rhythm  Abdomen:  Soft, nontender  Extremities: Pedal edema.  Neurologic: Awake, alert, conversant  Skin: Warm,dry, no rash  Access: None    Basic Metabolic Panel: Recent Labs  Lab 05/31/24 1429 05/31/24 2152 06/01/24 0534 06/01/24 1424 06/02/24 0344  NA 139  --  139 136 137  K 3.4*  --  3.0* 4.1 3.5  CL 97*  --  104 99 102  CO2 25  --  23 21* 23  GLUCOSE 102*  --  85 109* 92  BUN 28*  --  28* 28* 28*  CREATININE 3.07*  --  2.94* 3.31* 3.53*  CALCIUM 8.6*  --  7.3* 9.1 8.1*  MG  --  1.2*  --   --  1.7  PHOS  --  4.2  --   --  4.8*    Liver Function Tests: Recent Labs  Lab 05/31/24 1429 06/01/24 1801  AST 25 19  ALT 9 8  ALKPHOS 106 94  BILITOT 0.4 0.3  PROT 8.4* 8.1  ALBUMIN 3.4* 3.2*   Recent Labs  Lab 05/31/24 1429  LIPASE 57*   No results for input(s): AMMONIA in the last 168 hours.  CBC: Recent Labs  Lab 05/31/24 1413 06/01/24 0432 06/02/24 0344  WBC 24.1* 21.5* 15.5*  HGB 8.6* 7.2* 6.8*  HCT 26.8* 22.3* 21.3*  MCV 89.3 89.2 89.9  PLT 545* 494* 458*    Cardiac Enzymes: No results for input(s): CKTOTAL, CKMB, CKMBINDEX, TROPONINI in the  last 168 hours.  BNP: Invalid input(s): POCBNP  CBG: No results for input(s): GLUCAP in the last 168 hours.  Microbiology: Results for orders placed or performed during the hospital encounter of 05/31/24  Urine Culture     Status: Abnormal   Collection Time: 05/31/24  2:58 PM   Specimen: Urine, Clean Catch  Result Value Ref Range Status   Specimen Description   Final    URINE, CLEAN CATCH Performed at [facility], [address], [place], [place] [address]    Special Requests   Final    NONE Performed at [facility], [address] [place], [place] [address]    Culture MULTIPLE SPECIES PRESENT,  SUGGEST RECOLLECTION (A)  Final   Report Status 06/02/2024 FINAL  Final  Chlamydia/NGC rt PCR ([facility] only)     Status: None   Collection Time: 05/31/24  2:58 PM   Specimen: Urine  Result Value Ref Range Status   Specimen source GC/Chlam URINE, RANDOM  Final   Chlamydia Tr NOT DETECTED NOT DETECTED Final   N gonorrhoeae NOT DETECTED NOT DETECTED Final    Comment: (NOTE) This CT/NG assay has not been evaluated in patients with a history of  hysterectomy. Performed at [facility], [address] [place], [place] [address]   Culture, blood (routine x 2)     Status: None (Preliminary result)   Collection Time: 05/31/24  3:38 PM   Specimen: BLOOD  Result Value Ref Range Status   Specimen Description BLOOD BLOOD RIGHT ARM  Final   Special Requests   Final    BOTTLES DRAWN AEROBIC AND ANAEROBIC Blood Culture results may not be optimal due to an inadequate volume of blood received in culture bottles   Culture   Final    NO GROWTH 2 DAYS Performed at [facility], [address], [place], [place] [address]    Report Status PENDING  Incomplete  Culture, blood (routine x 2)     Status: None (Preliminary result)   Collection Time: 05/31/24  3:46 PM   Specimen: BLOOD  Result Value Ref Range Status   Specimen Description BLOOD LEFT HAND  Final   Special Requests   Final    BOTTLES DRAWN AEROBIC AND ANAEROBIC Blood Culture results may not be optimal due to an inadequate volume of blood received in culture bottles   Culture   Final    NO GROWTH 2 DAYS Performed at [facility], [address] [place], [place] [address]    Report Status PENDING  Incomplete    Coagulation Studies: Recent Labs    05/31/24 June 10, 2150  LABPROT 16.1*  INR 1.2    Urinalysis: Recent Labs    05/31/24 1458  COLORURINE AMBER*  LABSPEC 1.010  PHURINE 7.0  GLUCOSEU 50*  HGBUR LARGE*  BILIRUBINUR NEGATIVE  KETONESUR NEGATIVE  PROTEINUR >=300*  NITRITE NEGATIVE  LEUKOCYTESUR  TRACE*      Imaging: ECHOCARDIOGRAM COMPLETE Result Date: 06/01/2024    ECHOCARDIOGRAM REPORT   Patient Name:   Edward Macias Date of Exam: 06/01/2024 Medical Rec #:  [id number]    Height:       69.0 in Accession #:    [id number]   Weight:       145.0 lb Date of Birth:  03-20-1961   BSA:          1.802 m Patient Age:    63 years     BP:  125/75 mmHg Patient Gender: M            HR:           87 bpm. Exam Location:  [facility] Procedure: 2D Echo, Cardiac Doppler, Color Doppler and Strain Analysis (Both            Spectral and Color Flow Doppler were utilized during procedure). Indications:     CHF  History:         Patient has no prior history of Echocardiogram examinations.                  COPD; Risk Factors:Former Smoker and Hypertension.  Sonographer:     Philomena Daring Referring Phys:  [id number] XILIN NIU Diagnosing Phys: Evalene Lunger MD  Sonographer Comments: Global longitudinal strain was attempted. IMPRESSIONS  1. Left ventricular ejection fraction, by estimation, is 55 to 60%. The left ventricle has normal function. The left ventricle has no regional wall motion abnormalities. Left ventricular diastolic parameters are consistent with Grade I diastolic dysfunction (impaired relaxation). The average left ventricular global longitudinal strain is -13.3 %. The global longitudinal strain is abnormal.  2. Right ventricular systolic function is normal. The right ventricular size is normal.  3. The mitral valve is normal in structure. No evidence of mitral valve regurgitation. No evidence of mitral stenosis.  4. The aortic valve is normal in structure. Aortic valve regurgitation is mild. Aortic valve sclerosis is present, with no evidence of aortic valve stenosis.  5. The inferior vena cava is normal in size with greater than 50% respiratory variability, suggesting right atrial pressure of 3 mmHg. FINDINGS  Left Ventricle: Left ventricular ejection fraction, by estimation, is 55 to 60%. The left ventricle has  normal function. The left ventricle has no regional wall motion abnormalities. The average left ventricular global longitudinal strain is -13.3 %. Strain was performed and the global longitudinal strain is abnormal. The left ventricular internal cavity size was normal in size. There is no left ventricular hypertrophy. Left ventricular diastolic parameters are consistent with Grade I diastolic dysfunction (impaired relaxation). Right Ventricle: The right ventricular size is normal. No increase in right ventricular wall thickness. Right ventricular systolic function is normal. Left Atrium: Left atrial size was normal in size. Right Atrium: Right atrial size was normal in size. Pericardium: There is no evidence of pericardial effusion. Mitral Valve: The mitral valve is normal in structure. No evidence of mitral valve regurgitation. No evidence of mitral valve stenosis. Tricuspid Valve: The tricuspid valve is normal in structure. Tricuspid valve regurgitation is mild . No evidence of tricuspid stenosis. Aortic Valve: The aortic valve is normal in structure. Aortic valve regurgitation is mild. Aortic valve sclerosis is present, with no evidence of aortic valve stenosis. Pulmonic Valve: The pulmonic valve was normal in structure. Pulmonic valve regurgitation is not visualized. No evidence of pulmonic stenosis. Aorta: The aortic root is normal in size and structure. Venous: The inferior vena cava is normal in size with greater than 50% respiratory variability, suggesting right atrial pressure of 3 mmHg. IAS/Shunts: No atrial level shunt detected by color flow Doppler. Additional Comments: 3D was performed not requiring image post processing on an independent workstation and was indeterminate.  LEFT VENTRICLE PLAX 2D LVIDd:         4.40 cm   Diastology LVIDs:         2.90 cm   LV e' medial:    8.05 cm/s LV PW:  1.10 cm   LV E/e' medial:  11.6 LV IVS:        1.00 cm   LV e' lateral:   10.80 cm/s LVOT diam:     2.20 cm    LV E/e' lateral: 8.6 LV SV:         72 LV SV Index:   40        2D Longitudinal Strain LVOT Area:     3.80 cm  2D Strain GLS Avg:     -13.3 %  RIGHT VENTRICLE             IVC RV Basal diam:  4.10 cm     IVC diam: 1.50 cm RV Mid diam:    3.00 cm RV S prime:     10.70 cm/s TAPSE (M-mode): 1.8 cm LEFT ATRIUM             Index        RIGHT ATRIUM           Index LA diam:        3.20 cm 1.78 cm/m   RA Area:     17.30 cm LA Vol (A2C):   66.0 ml 36.62 ml/m  RA Volume:   48.10 ml  26.69 ml/m LA Vol (A4C):   46.3 ml 25.69 ml/m LA Biplane Vol: 58.1 ml 32.24 ml/m  AORTIC VALVE LVOT Vmax:   98.00 cm/s LVOT Vmean:  65.000 cm/s LVOT VTI:    0.190 m  AORTA Ao Root diam: 3.20 cm MITRAL VALVE MV Area (PHT): 3.99 cm     SHUNTS MV Decel Time: 190 msec     Systemic VTI:  0.19 m MV E velocity: 93.30 cm/s   Systemic Diam: 2.20 cm MV A velocity: 116.00 cm/s MV E/A ratio:  0.80 Evalene Lunger MD Electronically signed by Evalene Lunger MD Signature Date/Time: 06/01/2024/3:17:58 PM    Final    US  RENAL Result Date: 06/01/2024 EXAM: RETROPERITONEAL ULTRASOUND OF THE KIDNEYS 06/01/2024 02:54:35 PM TECHNIQUE: Real-time ultrasonography of the retroperitoneum, specifically the kidneys and urinary bladder, was performed. COMPARISON: January 2026, May 05 2024. CLINICAL HISTORY: Gross hematuria. FINDINGS: RIGHT KIDNEY: Right kidney measures 12.2 x 6.1 x 6.2 cm. Increased echogenicity of the kidneys. Small lower pole cyst measuring 1 cm. No hydronephrosis. No calculus. No mass. LEFT KIDNEY: Left kidney measures 12.6 x 7.2 x 6.1 cm. Increased echogenicity of the kidneys. No hydronephrosis. No calculus. No mass. BLADDER: Circumferential wall thickening of the urinary bladder, which is decompressed. IMPRESSION: 1. Circumferential wall thickening of the decompressed urinary bladder. If there is concern for acute cystitis, correlation with urinalysis will be recommended. 2. No hydronephrosis or nephrolithiasis. 3. Increased echogenicity  of both kidneys, which can be seen in both acute and chronic medical renal disease. Electronically signed by: Rogelia Myers MD 06/01/2024 03:06 PM EST RP Workstation: [id number]   CT ABDOMEN PELVIS WO CONTRAST Result Date: 05/31/2024 EXAM: CT ABDOMEN AND PELVIS WITHOUT CONTRAST 05/31/2024 05:23:31 PM TECHNIQUE: CT of the abdomen and pelvis was performed without the administration of intravenous contrast. Multiplanar reformatted images are provided for review. Automated exposure control, iterative reconstruction, and/or weight-based adjustment of the mA/kV was utilized to reduce the radiation dose to as low as reasonably achievable. COMPARISON: 05/01/2024 CLINICAL HISTORY: LLQ abdominal pain FINDINGS: LOWER CHEST: Bibasilar atelectasis. Previously seen pleural effusions have resolved. LIVER: The liver is unremarkable. GALLBLADDER AND BILE DUCTS: Gallbladder is unremarkable. No biliary ductal dilatation. SPLEEN: No acute abnormality. PANCREAS: No acute abnormality. ADRENAL GLANDS:  No acute abnormality. KIDNEYS, URETERS AND BLADDER: No stones in the kidneys or ureters. No hydronephrosis. No perinephric or periureteral stranding. Urinary bladder is unremarkable. GI AND BOWEL: Stomach demonstrates no acute abnormality. There is no bowel obstruction. Left colonic diverticulosis. Slight inflammatory stranding around the distal descending / proximal sigmoid colon suggesting early active diverticulitis. PERITONEUM AND RETROPERITONEUM: No ascites. No free air. VASCULATURE: Diffuse aortoiliac atherosclerosis. LYMPH NODES: No lymphadenopathy. REPRODUCTIVE ORGANS: No acute abnormality. BONES AND SOFT TISSUES: No acute osseous abnormality. No focal soft tissue abnormality. IMPRESSION: 1. Early active diverticulitis in the distal descending/proximal sigmoid colon with slight inflammatory stranding. 2. Left colonic diverticulosis. Electronically signed by: Franky Crease MD 05/31/2024 05:30 PM EST RP Workstation: [id number]   US   SCROTUM W/DOPPLER Result Date: 05/31/2024 CLINICAL DATA:  The similar appearance. EXAM: SCROTAL ULTRASOUND DOPPLER ULTRASOUND OF THE TESTICLES TECHNIQUE: Complete ultrasound examination of the testicles, epididymis, and other scrotal structures was performed. Color and spectral Doppler ultrasound were also utilized to evaluate blood flow to the testicles. COMPARISON:  None Available. FINDINGS: Right testicle Measurements: 3.5 x 1.7 x 2.5 cm. No mass or microlithiasis visualized. Doppler: There is normal vascularity on color doppler examination. Spectral doppler arterial and venous waveforms are normal. Left testicle Measurements:  3.3 x 1.6 x 2.1 cm. No mass or microlithiasis visualized. Doppler: There is normal vascularity on color doppler examination. Spectral doppler arterial and venous waveforms are normal. Right epididymis: Complex right epididymal head cyst measures 2.6 x 1.5 x 1.7 cm. There is slight enlargement of the right epididymis which may be related to presence of complex cyst. Correlation with clinical exam recommended to exclude epididymitis. Left epididymis: Left epididymal head cyst measures 0.7 x 5.4 x 0.7 cm. Hydrocele:  Trace right hydrocele with low-level echogenic debris. Varicocele:  None visualized. IMPRESSION: 1. Unremarkable testicles. 2. Bilateral epididymal cysts, right greater than left. Slight enlargement of the right epididymis. Clinical correlation recommended to exclude epididymitis. Electronically Signed   By: Vanetta Chou M.D.   On: 05/31/2024 17:27     Medications:    piperacillin -tazobactam (ZOSYN )  IV 3.375 g (06/02/24 1418)    doxycycline   100 mg Oral Q12H   enoxaparin  (LOVENOX ) injection  30 mg Subcutaneous Q24H   fluticasone  furoate-vilanterol  1 puff Inhalation Daily   folic acid   1 mg Oral Daily   ipratropium-albuterol   3 mL Nebulization TID   metoprolol  tartrate  25 mg Oral BID   nicotine   21 mg Transdermal Daily   thiamine   100 mg Oral Daily    acetaminophen , albuterol , dextromethorphan -guaiFENesin , hydrALAZINE , lactulose , metoprolol  tartrate, morphine  injection, ondansetron  (ZOFRAN ) IV, oxyCODONE   Assessment/ Plan:  Edward Macias is a 64 y.o.  male with past medical conditions to include hypertension, asthma, COPD, history of alcohol abuse in the past, hepatitis C, cirrhosis, diverticulosis, and aortic atherosclerosis. Patient presents to ED with Scrotal pain and has been admitted for SVT (supraventricular tachycardia) [I47.10] Acute diverticulitis [K57.92] Left lower quadrant abdominal pain [R10.32] Acute diverticulitis of intestine [K57.92]   Acute Kidney Injury with hematuria/proteinuria on chronic kidney disease stage IIIb with baseline creatinine 1.7 and GFR of 44 on 05/02/24. Normal function in November 2025. Acute kidney injury secondary to multiple recent IV contrast exposures and recent pyelonephritis. Current renal function coincides with latest hospitalization. Renal ultrasound suspicious for cystitis. UA has hematuria and proteinuria present. Appreciate urology consult and the will follow patient at discharge for outpatient cystoscopy. No acute indication for dialysis and will continue to monitor. Avoid nephrotoxic agents and therapies.  Lab Results  Component Value Date   CREATININE 3.53 (H) 06/02/2024   CREATININE 3.31 (H) 06/01/2024   CREATININE 2.94 (H) 06/01/2024    Intake/Output Summary (Last 24 hours) at 06/02/2024 1459 Last data filed at 06/02/2024 1359 Gross per 24 hour  Intake 705.24 ml  Output 1900 ml  Net -1194.76 ml   2. Hypertension, essential. Medications include amlodipine , and torsemide . Currently receiving metoprolol  only.   3. Anemia of chronic kidney disease Lab Results  Component Value Date   HGB 6.8 (L) 06/02/2024    Hgb reduced today, 6.8. Patient receiving blood transfusion during my visit.   4. Acute diverticulitis, seen on Ct abd/pelvis. Receiving doxycycline  and zosyn .      LOS: 2 Edward Macias 1/22/20262:59 PM   "

## 2024-06-02 NOTE — Progress Notes (Signed)
 " Progress Note   Patient: Edward Macias FMW:[id number] DOB: 1960/11/23 DOA: 05/31/2024     2 DOS: the patient was seen and examined on 06/02/2024   Brief hospital course: Edward Macias is a 64 y.o. male with medical history significant of HTN, COPD/asthma, HCV, recurrent UTI, E. coli septicemia, renal and perinephritic abscess, tobacco abuse, alcohol abuse (not drinking for more than 3 weeks), recent admission due to sepsis secondary to pyelonephritis 12/21 - 12/31, who presents with abdominal pain and testicular pain.  CT scan showed early Doppler colitis in the distal descending and proximal sigmoid colon. Patient was started on Zosyn .   Principal Problem:   Acute diverticulitis Active Problems:   Scrotal pain   SVT (supraventricular tachycardia)   HTN (hypertension)   COPD (chronic obstructive pulmonary disease) (HCC)   Hypokalemia   AKI (acute kidney injury)   Bilateral leg edema   Tobacco abuse   Alcohol use disorder   Hypomagnesemia   Acute diverticulitis of intestine   Assessment and Plan: Acute diverticulitis: CT scan showed early active diverticulitis in the distal descending/proximal sigmoid colon with slight inflammatory stranding. Pt has leukocytosis with WBC 24.1, but no fever, lactic acid normal 1.9, clinically does not seem to have sepsis. Blood cultures pending, patient was placed on Zosyn .  Will continue. Reviewed the chart, patient had a multiple admission with infection over the last 2 months.  HIV was negative in the last admission.  Blood culture negative, leukocytosis improving.   Scrotal pain secondary to epididymitis:  Scrotum ultrasound showed evidence of epididymitis.  Will continue Zosyn  with added doxycycline .   SVT (supraventricular tachycardia): Etiology is not clear.  Initial heart rate of 170s, converted to sinus rhythm with heart rate 80-90s after treated with metoprolol . - metoprolol  25 mg twice daily orally - Condition has improved.  Patient can  follow-up with cardiology as outpatient.   HTN (hypertension) -On metoprolol  25 mg twice daily as above     COPD (chronic obstructive pulmonary disease) (HCC): Stable -Bronchodilators and as needed Mucinex    Hypokalemia Hypomagnesemia. AKI (acute kidney injury) Gross hematuria: Patient had GFR> 60 and creatinine 0.96 on 03/18/2024.  He had an AKI due to pyelonephritis with creatinine up to 3.95 on 05/06/2024 --> 3.54 on 05/11/2024 . Creatinine 2.94 today.  CT scan of the abdomen/pelvis did not show any hydronephrosis.  Will obtain bladder scan to rule out urinary retention. Repleted potassium and magnesium . Patient also has gross hematuria for the last 2 days, UA's showed large amount of red cells with hemoglobin, urine looks pink in color. Ultrasound consistent with medical renal disease, no hydronephrosis or malignancy. Patient renal function continue to get worse, nephrology consult obtained.  Severe anemia. Likely acute blood loss anemia from hematuria. Reactive thrombocytosis. Patient is starting to have anemia since last admission on 12/14, prior to that, hemoglobin was normal.  Hemoglobin further dropped down to 7.2 today, Patient has a lower iron, but elevated ferritin, also normal B12 and folic acid  level. Hemoglobin dropped down to 6.8 today, will transfuse 1 unit of PRBC. Drop her hemoglobin probably due to acute blood loss from hematuria and from renal failure.     Bilateral leg edema:  Patient has a mild elevation in proBNP associated with creatinine around 3.  No overall volume overload.  Leg edema was due to acute renal failure.  Echocardiogram showed normal ejection fraction with grade 1 diastolic dysfunction.   Tobacco abuse -Nicotine  patch -Counseling about importance of quitting smoking   Alcohol use disorder:  Patient states that he did not drink alcohol for more than 3 weeks.  No signs for withdrawal. - give vitamin B1 and folic acid  No evidence of alcohol  withdrawal.         Subjective:  Patient does not have any discomfort, denies any short of breath.  Good appetite without nausea vomiting.  Physical Exam: Vitals:   06/02/24 0447 06/02/24 0500 06/02/24 0916 06/02/24 0931  BP: 137/80  123/82 120/80  Pulse: 77  85 83  Resp: 17  18 18   Temp: 98.4 F (36.9 C)  (!) 97.5 F (36.4 C) 98 F (36.7 C)  TempSrc: Oral  Oral Oral  SpO2: 99%     Weight:  68.6 kg    Height:       General exam: Appears calm and comfortable  Respiratory system: Clear to auscultation. Respiratory effort normal. Cardiovascular system: S1 & S2 heard, RRR. No JVD, murmurs, rubs, gallops or clicks. No pedal edema. Gastrointestinal system: Abdomen is nondistended, soft and nontender. No organomegaly or masses felt. Normal bowel sounds heard. Central nervous system: Alert and oriented. No focal neurological deficits. Extremities: Symmetric 5 x 5 power. Skin: No rashes, lesions or ulcers Psychiatry: Judgement and insight appear normal. Mood & affect appropriate.    Data Reviewed:  Reviewed lab results, CT scan results.  Family Communication: None.  Disposition: Status is: Inpatient Remains inpatient appropriate because: Severity of disease, IV treatment     Time spent: 50 minutes  Author: Murvin Mana, MD 06/02/2024 12:03 PM  For on call review [email or web address].    "

## 2024-06-02 NOTE — Telephone Encounter (Signed)
 Please schedule a cystoscopy with Dr. Georganne in 3 to 4 weeks.  He is currently in the hospital.

## 2024-06-02 NOTE — Consult Note (Signed)
 "    Urology Consult  I have been asked to see the patient by Dr. Murvin Mana, for evaluation and management of gross hematuria.  Chief Complaint: Abdominal pain   History of Present Illness: Edward Macias is a 64 y.o. year old male with medical history significant of hypertension, COPD/asthma, HCV, recurrent UTI, E. coli septicemia, renal and perinephritic abscess, tobacco abuse, alcohol abuse, recent admissions due to sepsis secondary to pyelonephritis now admitted for acute diverticulitis.   Patient presented to the ED for abdominal pain and testicular pain on January 20.  He is found to have SVT with heart rates up to 170.  His WBC count was 24.1, lactic acid at 1.9, UA (hazy appearance, trace amount leukocyte, negative bacteria, WBCs 25-50).  Serum creatinine 3.07.  Urine culture grew out multiple species.  Chlamydia/gonorrhea negative.  Scrotal ultrasound noted unremarkable testicle with bilateral epididymal cyst and slight enlargement of the right epididymis.  CT of the abdomen and pelvis had findings positive for early diverticulitis.  Renal ultrasounds during this admission have not demonstrated hydronephrosis, renal masses or a bladder mass. Non contrast CT with no hydronephrosis, nephrolithiasis, renal masses, or bladder masses.    Previously, he was admitted on 12/13 for bilateral pyelonephritis.  He was also placed on tamsulosin  during that admission for urinary frequency.  He then presented to [facility] on 12/18 and was admitted after finding right renal microabscesses.  He was discharged on 12/20.  He then returned on 12/21 and was admitted for sepsis secondary to bilateral pyelonephritis.  He was discharged on 12/31.  He then returned to the hospital on 01/20 for this current admission.    He has not seen an urologist in the past.  Prior to December, he states that he had no issues with voiding.  Now, he states he is having significant nocturia, a weak urinary stream and gross hematuria.  He  denies any discomfort with voiding.  He has a remote history of STI.   His urine is a clear orange-pink.  He states his urine has been this color for the last week.  He denies any passage of clots.  Urine culture grew out multiple species.  Chlamydia/gonorrhea negative.  This mornings labs are serum creatinine 3.53, WBC 15.5,  His hemoglobin is 6.8 down from 8.6 at the time of admission.  He is receiving one unit of blood today.   Bladder scan this morning was 69 mL.  Prostate volume ~ 35 cc on recent CT's.   He denied any family history of prostate cancer.  No recent PSAs available.  PSA 7 years ago was 0.42.  Past Medical History:  Diagnosis Date   Arthritis    Asthma    AS A CHILD-NO INHALERS   Asthma exacerbation 04/09/2019   COPD (chronic obstructive pulmonary disease) (HCC)    Pneumonia    Sepsis (HCC) 07/24/2018    Past Surgical History:  Procedure Laterality Date   INGUINAL HERNIA REPAIR Left 10/03/2016   Procedure: HERNIA REPAIR INGUINAL ADULT;  Surgeon: Claudene Larinda Bolder, MD;  Location: [facility] ORS;  Service: General;  Laterality: Left;   INSERTION OF MESH Bilateral 03/19/2022   Procedure: INSERTION OF MESH;  Surgeon: Lane Shope, MD;  Location: [facility] ORS;  Service: General;  Laterality: Bilateral;    Home Medications:  Anti-infectives (From admission, onward)    Start     Dose/Rate Route Frequency Ordered Stop   06/01/24 0600  piperacillin -tazobactam (ZOSYN ) IVPB 3.375 g  3.375 g 12.5 mL/hr over 240 Minutes Intravenous Every 8 hours 05/31/24 1913     05/31/24 2200  doxycycline  (VIBRA -TABS) tablet 100 mg        100 mg Oral Every 12 hours 05/31/24 1931     05/31/24 1745  piperacillin -tazobactam (ZOSYN ) IVPB 3.375 g        3.375 g 100 mL/hr over 30 Minutes Intravenous  Once 05/31/24 1733 05/31/24 1759        Allergies: Allergies[1]  Family History  Problem Relation Age of Onset   Pancreatic cancer Sister    Pancreatic cancer Brother     Social  History:  reports that he has been smoking cigarettes. He has a 39 pack-year smoking history. He has never used smokeless tobacco. He reports current alcohol use. He reports that he does not use drugs.  ROS: A complete review of systems was performed.  All systems are negative except for pertinent findings as noted.  Physical Exam:  Vital signs in last 24 hours: Temp:  [97.5 F (36.4 C)-98.4 F (36.9 C)] 97.5 F (36.4 C) (01/22 0916) Pulse Rate:  [71-86] 85 (01/22 0916) Resp:  [16-22] 18 (01/22 0916) BP: (123-144)/(75-87) 123/82 (01/22 0916) SpO2:  [97 %-100 %] 99 % (01/22 0447) Weight:  [68.6 kg] 68.6 kg (01/22 0500) Constitutional:  Well nourished. Alert and oriented, No acute distress. HEENT: [place] AT, moist mucus membranes.  Trachea midline Cardiovascular: No clubbing, cyanosis, or edema. Respiratory: Normal respiratory effort, no increased work of breathing. GI: Abdomen is soft, non tender, non distended, no abdominal masses. GU: No CVA tenderness.  No bladder fullness or masses.  Patient with uncircumcised phallus. Foreskin easily retracted.  Urethral meatus is patent.  No penile discharge. No penile lesions or rashes. Scrotum without lesions, cysts, rashes and/or edema.  Testicles are located scrotally bilaterally, they are both atrophic.  No masses are appreciated in the testicles. Left epididymis is normal.  Right epididymal cyst palpated, non tender.  Right epididymis is not indurated and it is nontender. Skin: No rashes, bruises or suspicious lesions. Lymph: No cervical or inguinal adenopathy. Neurologic: Grossly intact, no focal deficits, moving all 4 extremities. Psychiatric: Normal mood and affect.     Laboratory Data:  Recent Labs    05/31/24 1413 06/01/24 0432 06/02/24 0344  WBC 24.1* 21.5* 15.5*  HGB 8.6* 7.2* 6.8*  HCT 26.8* 22.3* 21.3*   Recent Labs    06/01/24 0534 06/01/24 1424 06/02/24 0344  NA 139 136 137  K 3.0* 4.1 3.5  CL 104 99 102  CO2 23 21* 23   GLUCOSE 85 109* 92  BUN 28* 28* 28*  CREATININE 2.94* 3.31* 3.53*  CALCIUM 7.3* 9.1 8.1*   Recent Labs    05/31/24 2152  INR 1.2   No results for input(s): LABURIN in the last 72 hours. Results for orders placed or performed during the hospital encounter of 05/31/24  Urine Culture     Status: Abnormal   Collection Time: 05/31/24  2:58 PM   Specimen: Urine, Clean Catch  Result Value Ref Range Status   Specimen Description   Final    URINE, CLEAN CATCH Performed at [facility], [address] [place], [place] [address]    Special Requests   Final    NONE Performed at [facility], [address] [place], [place] [address]    Culture MULTIPLE SPECIES PRESENT, SUGGEST RECOLLECTION (A)  Final   Report Status 06/02/2024 FINAL  Final  Chlamydia/NGC rt PCR [facility]  only)     Status: None   Collection Time: 05/31/24  2:58 PM   Specimen: Urine  Result Value Ref Range Status   Specimen source GC/Chlam URINE, RANDOM  Final   Chlamydia Tr NOT DETECTED NOT DETECTED Final   N gonorrhoeae NOT DETECTED NOT DETECTED Final    Comment: (NOTE) This CT/NG assay has not been evaluated in patients with a history of  hysterectomy. Performed at [facility], [address] [place], [place] [address]   Culture, blood (routine x 2)     Status: None (Preliminary result)   Collection Time: 05/31/24  3:38 PM   Specimen: BLOOD  Result Value Ref Range Status   Specimen Description BLOOD BLOOD RIGHT ARM  Final   Special Requests   Final    BOTTLES DRAWN AEROBIC AND ANAEROBIC Blood Culture results may not be optimal due to an inadequate volume of blood received in culture bottles   Culture   Final    NO GROWTH 2 DAYS Performed at [facility], [address] [place], [place] [address]    Report Status PENDING  Incomplete  Culture, blood (routine x 2)     Status: None (Preliminary result)   Collection Time: 05/31/24  3:46 PM   Specimen: BLOOD  Result  Value Ref Range Status   Specimen Description BLOOD LEFT HAND  Final   Special Requests   Final    BOTTLES DRAWN AEROBIC AND ANAEROBIC Blood Culture results may not be optimal due to an inadequate volume of blood received in culture bottles   Culture   Final    NO GROWTH 2 DAYS Performed at [facility], [address] [place], [place] [address]    Report Status PENDING  Incomplete     Radiologic Imaging: ECHOCARDIOGRAM COMPLETE Result Date: 06/01/2024    ECHOCARDIOGRAM REPORT   Patient Name:   EUAN WANDLER Date of Exam: 06/01/2024 Medical Rec #:  [id number]    Height:       69.0 in Accession #:    [id number]   Weight:       145.0 lb Date of Birth:  July 09, 1960   BSA:          1.802 m Patient Age:    63 years     BP:           125/75 mmHg Patient Gender: M            HR:           87 bpm. Exam Location:  [facility] Procedure: 2D Echo, Cardiac Doppler, Color Doppler and Strain Analysis (Both            Spectral and Color Flow Doppler were utilized during procedure). Indications:     CHF  History:         Patient has no prior history of Echocardiogram examinations.                  COPD; Risk Factors:Former Smoker and Hypertension.  Sonographer:     Philomena Daring Referring Phys:  [id number] XILIN NIU Diagnosing Phys: Evalene Lunger MD  Sonographer Comments: Global longitudinal strain was attempted. IMPRESSIONS  1. Left ventricular ejection fraction, by estimation, is 55 to 60%. The left ventricle has normal function. The left ventricle has no regional wall motion abnormalities. Left ventricular diastolic parameters are consistent with Grade I diastolic dysfunction (impaired relaxation). The average left ventricular global longitudinal strain is -13.3 %. The global longitudinal strain is abnormal.  2. Right ventricular systolic function is normal. The right ventricular size is normal.  3. The mitral valve is normal in structure. No evidence of mitral valve regurgitation. No evidence of mitral stenosis.  4.  The aortic valve is normal in structure. Aortic valve regurgitation is mild. Aortic valve sclerosis is present, with no evidence of aortic valve stenosis.  5. The inferior vena cava is normal in size with greater than 50% respiratory variability, suggesting right atrial pressure of 3 mmHg. FINDINGS  Left Ventricle: Left ventricular ejection fraction, by estimation, is 55 to 60%. The left ventricle has normal function. The left ventricle has no regional wall motion abnormalities. The average left ventricular global longitudinal strain is -13.3 %. Strain was performed and the global longitudinal strain is abnormal. The left ventricular internal cavity size was normal in size. There is no left ventricular hypertrophy. Left ventricular diastolic parameters are consistent with Grade I diastolic dysfunction (impaired relaxation). Right Ventricle: The right ventricular size is normal. No increase in right ventricular wall thickness. Right ventricular systolic function is normal. Left Atrium: Left atrial size was normal in size. Right Atrium: Right atrial size was normal in size. Pericardium: There is no evidence of pericardial effusion. Mitral Valve: The mitral valve is normal in structure. No evidence of mitral valve regurgitation. No evidence of mitral valve stenosis. Tricuspid Valve: The tricuspid valve is normal in structure. Tricuspid valve regurgitation is mild . No evidence of tricuspid stenosis. Aortic Valve: The aortic valve is normal in structure. Aortic valve regurgitation is mild. Aortic valve sclerosis is present, with no evidence of aortic valve stenosis. Pulmonic Valve: The pulmonic valve was normal in structure. Pulmonic valve regurgitation is not visualized. No evidence of pulmonic stenosis. Aorta: The aortic root is normal in size and structure. Venous: The inferior vena cava is normal in size with greater than 50% respiratory variability, suggesting right atrial pressure of 3 mmHg. IAS/Shunts: No atrial  level shunt detected by color flow Doppler. Additional Comments: 3D was performed not requiring image post processing on an independent workstation and was indeterminate.  LEFT VENTRICLE PLAX 2D LVIDd:         4.40 cm   Diastology LVIDs:         2.90 cm   LV e' medial:    8.05 cm/s LV PW:         1.10 cm   LV E/e' medial:  11.6 LV IVS:        1.00 cm   LV e' lateral:   10.80 cm/s LVOT diam:     2.20 cm   LV E/e' lateral: 8.6 LV SV:         72 LV SV Index:   40        2D Longitudinal Strain LVOT Area:     3.80 cm  2D Strain GLS Avg:     -13.3 %  RIGHT VENTRICLE             IVC RV Basal diam:  4.10 cm     IVC diam: 1.50 cm RV Mid diam:    3.00 cm RV S prime:     10.70 cm/s TAPSE (M-mode): 1.8 cm LEFT ATRIUM             Index        RIGHT ATRIUM           Index LA diam:        3.20 cm 1.78 cm/m   RA Area:  17.30 cm LA Vol (A2C):   66.0 ml 36.62 ml/m  RA Volume:   48.10 ml  26.69 ml/m LA Vol (A4C):   46.3 ml 25.69 ml/m LA Biplane Vol: 58.1 ml 32.24 ml/m  AORTIC VALVE LVOT Vmax:   98.00 cm/s LVOT Vmean:  65.000 cm/s LVOT VTI:    0.190 m  AORTA Ao Root diam: 3.20 cm MITRAL VALVE MV Area (PHT): 3.99 cm     SHUNTS MV Decel Time: 190 msec     Systemic VTI:  0.19 m MV E velocity: 93.30 cm/s   Systemic Diam: 2.20 cm MV A velocity: 116.00 cm/s MV E/A ratio:  0.80 Evalene Lunger MD Electronically signed by Evalene Lunger MD Signature Date/Time: 06/01/2024/3:17:58 PM    Final    US  RENAL Result Date: 06/01/2024 EXAM: RETROPERITONEAL ULTRASOUND OF THE KIDNEYS 06/01/2024 02:54:35 PM TECHNIQUE: Real-time ultrasonography of the retroperitoneum, specifically the kidneys and urinary bladder, was performed. COMPARISON: January 2026, May 05 2024. CLINICAL HISTORY: Gross hematuria. FINDINGS: RIGHT KIDNEY: Right kidney measures 12.2 x 6.1 x 6.2 cm. Increased echogenicity of the kidneys. Small lower pole cyst measuring 1 cm. No hydronephrosis. No calculus. No mass. LEFT KIDNEY: Left kidney measures 12.6 x 7.2 x 6.1 cm.  Increased echogenicity of the kidneys. No hydronephrosis. No calculus. No mass. BLADDER: Circumferential wall thickening of the urinary bladder, which is decompressed. IMPRESSION: 1. Circumferential wall thickening of the decompressed urinary bladder. If there is concern for acute cystitis, correlation with urinalysis will be recommended. 2. No hydronephrosis or nephrolithiasis. 3. Increased echogenicity of both kidneys, which can be seen in both acute and chronic medical renal disease. Electronically signed by: Rogelia Myers MD 06/01/2024 03:06 PM EST RP Workstation: [id number]   CT ABDOMEN PELVIS WO CONTRAST Result Date: 05/31/2024 EXAM: CT ABDOMEN AND PELVIS WITHOUT CONTRAST 05/31/2024 05:23:31 PM TECHNIQUE: CT of the abdomen and pelvis was performed without the administration of intravenous contrast. Multiplanar reformatted images are provided for review. Automated exposure control, iterative reconstruction, and/or weight-based adjustment of the mA/kV was utilized to reduce the radiation dose to as low as reasonably achievable. COMPARISON: 05/01/2024 CLINICAL HISTORY: LLQ abdominal pain FINDINGS: LOWER CHEST: Bibasilar atelectasis. Previously seen pleural effusions have resolved. LIVER: The liver is unremarkable. GALLBLADDER AND BILE DUCTS: Gallbladder is unremarkable. No biliary ductal dilatation. SPLEEN: No acute abnormality. PANCREAS: No acute abnormality. ADRENAL GLANDS: No acute abnormality. KIDNEYS, URETERS AND BLADDER: No stones in the kidneys or ureters. No hydronephrosis. No perinephric or periureteral stranding. Urinary bladder is unremarkable. GI AND BOWEL: Stomach demonstrates no acute abnormality. There is no bowel obstruction. Left colonic diverticulosis. Slight inflammatory stranding around the distal descending / proximal sigmoid colon suggesting early active diverticulitis. PERITONEUM AND RETROPERITONEUM: No ascites. No free air. VASCULATURE: Diffuse aortoiliac atherosclerosis. LYMPH NODES:  No lymphadenopathy. REPRODUCTIVE ORGANS: No acute abnormality. BONES AND SOFT TISSUES: No acute osseous abnormality. No focal soft tissue abnormality. IMPRESSION: 1. Early active diverticulitis in the distal descending/proximal sigmoid colon with slight inflammatory stranding. 2. Left colonic diverticulosis. Electronically signed by: Franky Crease MD 05/31/2024 05:30 PM EST RP Workstation: [id number]   US  SCROTUM W/DOPPLER Result Date: 05/31/2024 CLINICAL DATA:  The similar appearance. EXAM: SCROTAL ULTRASOUND DOPPLER ULTRASOUND OF THE TESTICLES TECHNIQUE: Complete ultrasound examination of the testicles, epididymis, and other scrotal structures was performed. Color and spectral Doppler ultrasound were also utilized to evaluate blood flow to the testicles. COMPARISON:  None Available. FINDINGS: Right testicle Measurements: 3.5 x 1.7 x 2.5 cm. No mass or microlithiasis visualized. Doppler: There is normal  vascularity on color doppler examination. Spectral doppler arterial and venous waveforms are normal. Left testicle Measurements:  3.3 x 1.6 x 2.1 cm. No mass or microlithiasis visualized. Doppler: There is normal vascularity on color doppler examination. Spectral doppler arterial and venous waveforms are normal. Right epididymis: Complex right epididymal head cyst measures 2.6 x 1.5 x 1.7 cm. There is slight enlargement of the right epididymis which may be related to presence of complex cyst. Correlation with clinical exam recommended to exclude epididymitis. Left epididymis: Left epididymal head cyst measures 0.7 x 5.4 x 0.7 cm. Hydrocele:  Trace right hydrocele with low-level echogenic debris. Varicocele:  None visualized. IMPRESSION: 1. Unremarkable testicles. 2. Bilateral epididymal cysts, right greater than left. Slight enlargement of the right epididymis. Clinical correlation recommended to exclude epididymitis. Electronically Signed   By: Vanetta Chou M.D.   On: 05/31/2024 17:27     Impression/Assessment:  64 year old man with a past medical history of prior admissions for bilateral pyelonephritis with microabscesses currently admitted for diverticulitis with complaints of gross hematuria.  Prior to recent admissions, he states he had no urological issues and had not seen a urologist in the past.  Gross hematuria, nonsevere, without evidence of upper tract pathology also no significant clot burden or bladder mass seen on admission imaging.  Bladder scan demonstrate adequate bladder emptying.  Urinary tract infection, likely source of current hematuria.  He will require lower urinary tract evaluation to exclude bladder pathology on an outpatient basis.   Plan:  - Plan to repeat urine culture, as admitting culture grew out multiple species, to ensure infection is being adequately treated and help guide outpatient antibiotic choice - Continue IV antibiotics and transition to p.o. antibiotics on discharge - Agree with nephrology consult as the degree of hematuria by itself does not explain the drop in hemoglobin - Encourage hydration  - No need for emergent cystoscopy at this time or CBI  - We will schedule outpatient cystoscopy after the completion of antibiotic therapy and resolution of acute infection - Please alert us  for worsening hematuria with clots, urinary retention, fever, or flank pain  06/02/2024, 9:27 AM  Vito Beg, PA-C      [1] No Known Allergies  "

## 2024-06-03 DIAGNOSIS — N5082 Scrotal pain: Secondary | ICD-10-CM | POA: Diagnosis not present

## 2024-06-03 DIAGNOSIS — I471 Supraventricular tachycardia, unspecified: Secondary | ICD-10-CM | POA: Diagnosis not present

## 2024-06-03 DIAGNOSIS — K5792 Diverticulitis of intestine, part unspecified, without perforation or abscess without bleeding: Secondary | ICD-10-CM | POA: Diagnosis not present

## 2024-06-03 DIAGNOSIS — N179 Acute kidney failure, unspecified: Secondary | ICD-10-CM | POA: Diagnosis not present

## 2024-06-03 LAB — CBC
HCT: 24.2 % — ABNORMAL LOW (ref 39.0–52.0)
Hemoglobin: 7.8 g/dL — ABNORMAL LOW (ref 13.0–17.0)
MCH: 28.5 pg (ref 26.0–34.0)
MCHC: 32.2 g/dL (ref 30.0–36.0)
MCV: 88.3 fL (ref 80.0–100.0)
Platelets: 435 K/uL — ABNORMAL HIGH (ref 150–400)
RBC: 2.74 MIL/uL — ABNORMAL LOW (ref 4.22–5.81)
RDW: 16.7 % — ABNORMAL HIGH (ref 11.5–15.5)
WBC: 12.1 K/uL — ABNORMAL HIGH (ref 4.0–10.5)
nRBC: 0 % (ref 0.0–0.2)

## 2024-06-03 LAB — TYPE AND SCREEN
ABO/RH(D): A POS
Antibody Screen: NEGATIVE
Unit division: 0

## 2024-06-03 LAB — MAGNESIUM: Magnesium: 1.4 mg/dL — ABNORMAL LOW (ref 1.7–2.4)

## 2024-06-03 LAB — BASIC METABOLIC PANEL WITH GFR
Anion gap: 12 (ref 5–15)
BUN: 25 mg/dL — ABNORMAL HIGH (ref 8–23)
CO2: 24 mmol/L (ref 22–32)
Calcium: 8.4 mg/dL — ABNORMAL LOW (ref 8.9–10.3)
Chloride: 104 mmol/L (ref 98–111)
Creatinine, Ser: 3.24 mg/dL — ABNORMAL HIGH (ref 0.61–1.24)
GFR, Estimated: 21 mL/min — ABNORMAL LOW
Glucose, Bld: 121 mg/dL — ABNORMAL HIGH (ref 70–99)
Potassium: 3.1 mmol/L — ABNORMAL LOW (ref 3.5–5.1)
Sodium: 140 mmol/L (ref 135–145)

## 2024-06-03 LAB — PHOSPHORUS: Phosphorus: 6.3 mg/dL — ABNORMAL HIGH (ref 2.5–4.6)

## 2024-06-03 LAB — BPAM RBC
Blood Product Expiration Date: 202602152359
ISSUE DATE / TIME: 202601220906
Unit Type and Rh: 6200

## 2024-06-03 LAB — HAPTOGLOBIN: Haptoglobin: 524 mg/dL — ABNORMAL HIGH (ref 32–363)

## 2024-06-03 MED ORDER — AMLODIPINE BESYLATE 10 MG PO TABS
10.0000 mg | ORAL_TABLET | Freq: Every day | ORAL | Status: DC
  Administered 2024-06-03 – 2024-06-04 (×2): 10 mg via ORAL
  Filled 2024-06-03 (×2): qty 1

## 2024-06-03 MED ORDER — POTASSIUM CHLORIDE CRYS ER 20 MEQ PO TBCR
40.0000 meq | EXTENDED_RELEASE_TABLET | ORAL | Status: AC
  Administered 2024-06-03 (×2): 40 meq via ORAL
  Filled 2024-06-03 (×2): qty 2

## 2024-06-03 MED ORDER — MAGNESIUM SULFATE 4 GM/100ML IV SOLN
4.0000 g | Freq: Once | INTRAVENOUS | Status: AC
  Administered 2024-06-03: 4 g via INTRAVENOUS
  Filled 2024-06-03: qty 100

## 2024-06-03 MED ORDER — TAMSULOSIN HCL 0.4 MG PO CAPS
0.4000 mg | ORAL_CAPSULE | Freq: Every day | ORAL | Status: DC
  Administered 2024-06-03 – 2024-06-04 (×2): 0.4 mg via ORAL
  Filled 2024-06-03 (×2): qty 1

## 2024-06-03 NOTE — Plan of Care (Signed)

## 2024-06-03 NOTE — Plan of Care (Signed)
" °  Problem: Clinical Measurements: Goal: Will remain free from infection Outcome: Not Progressing   Problem: Elimination: Goal: Will not experience complications related to urinary retention Outcome: Not Progressing   Problem: Pain Managment: Goal: General experience of comfort will improve and/or be controlled Outcome: Not Progressing   "

## 2024-06-03 NOTE — Progress Notes (Signed)
 " [facility]  ROUNDING NOTE   Subjective:   Edward Macias is a 64 y.o. male with past medical conditions to include hypertension, asthma, COPD, history of alcohol abuse in the past, hepatitis C, cirrhosis, diverticulosis, and aortic atherosclerosis. Patient presents to ED with Scrotal pain and has been admitted for SVT (supraventricular tachycardia) [I47.10] Acute diverticulitis [K57.92] Left lower quadrant abdominal pain [R10.32] Acute diverticulitis of intestine [K57.92]  Patient is known to our practice and is followed by Dr Dennise, last appt on 1/13.   Resting comfortably Awake and alert No lower extremity edema Room air Pink urine noted in urinal, denies discomfort when urinating.    Objective:  Vital signs in last 24 hours:  Temp:  [98.2 F (36.8 C)-98.7 F (37.1 C)] 98.2 F (36.8 C) (01/23 1231) Pulse Rate:  [69-82] 81 (01/23 1231) Resp:  [16-18] 16 (01/23 1231) BP: (131-145)/(76-83) 133/80 (01/23 1231) SpO2:  [97 %-100 %] 100 % (01/23 1231) Weight:  [69.1 kg] 69.1 kg (01/23 0500)  Weight change: 0.5 kg Filed Weights   05/31/24 1412 06/02/24 0500 06/03/24 0500  Weight: 65.8 kg 68.6 kg 69.1 kg    Intake/Output: I/O last 3 completed shifts: In: 1297.2 [P.O.:772; Blood:356.8; IV Piggyback:168.5] Out: 4610 [Urine:4610]   Intake/Output this shift:  Total I/O In: -  Out: 550 [Urine:550]  Physical Exam: General: NAD, resting comfortably  Head: Normocephalic, atraumatic. Moist oral mucosal membranes  Eyes: Anicteric  Lungs:  Clear to auscultation, normal effort  Heart: Regular rate and rhythm  Abdomen:  Soft, nontender  Extremities: No peripheral edema  Neurologic: Awake, alert, conversant  Skin: Warm,dry, no rash  Access: None    Basic Metabolic Panel: Recent Labs  Lab 05/31/24 1429 05/31/24 2152 06/01/24 0534 06/01/24 1424 06/02/24 0344 06/03/24 0446 06/03/24 0447  NA 139  --  139 136 137  --  140  K 3.4*  --  3.0* 4.1 3.5  --  3.1*   CL 97*  --  104 99 102  --  104  CO2 25  --  23 21* 23  --  24  GLUCOSE 102*  --  85 109* 92  --  121*  BUN 28*  --  28* 28* 28*  --  25*  CREATININE 3.07*  --  2.94* 3.31* 3.53*  --  3.24*  CALCIUM 8.6*  --  7.3* 9.1 8.1*  --  8.4*  MG  --  1.2*  --   --  1.7  --  1.4*  PHOS  --  4.2  --   --  4.8* 6.3*  --     Liver Function Tests: Recent Labs  Lab 05/31/24 1429 06/01/24 1801  AST 25 19  ALT 9 8  ALKPHOS 106 94  BILITOT 0.4 0.3  PROT 8.4* 8.1  ALBUMIN 3.4* 3.2*   Recent Labs  Lab 05/31/24 1429  LIPASE 57*   No results for input(s): AMMONIA in the last 168 hours.  CBC: Recent Labs  Lab 05/31/24 1413 06/01/24 0432 06/02/24 0344 06/02/24 1615 06/03/24 0447  WBC 24.1* 21.5* 15.5*  --  12.1*  HGB 8.6* 7.2* 6.8* 8.1* 7.8*  HCT 26.8* 22.3* 21.3* 25.5* 24.2*  MCV 89.3 89.2 89.9  --  88.3  PLT 545* 494* 458*  --  435*    Cardiac Enzymes: No results for input(s): CKTOTAL, CKMB, CKMBINDEX, TROPONINI in the last 168 hours.  BNP: Invalid input(s): POCBNP  CBG: No results for input(s): GLUCAP in the last 168 hours.  Microbiology: Results for orders placed or performed during the hospital encounter of 05/31/24  Urine Culture     Status: Abnormal   Collection Time: 05/31/24  2:58 PM   Specimen: Urine, Clean Catch  Result Value Ref Range Status   Specimen Description   Final    URINE, CLEAN CATCH Performed at [facility], [address] [place], [place] [address]    Special Requests   Final    NONE Performed at [facility], [address] [place], [place] [address]    Culture MULTIPLE SPECIES PRESENT, SUGGEST RECOLLECTION (A)  Final   Report Status 06/02/2024 FINAL  Final  Chlamydia/NGC rt PCR ([facility] only)     Status: None   Collection Time: 05/31/24  2:58 PM   Specimen: Urine  Result Value Ref Range Status   Specimen source GC/Chlam URINE, RANDOM  Final   Chlamydia Tr NOT DETECTED NOT DETECTED Final   N gonorrhoeae  NOT DETECTED NOT DETECTED Final    Comment: (NOTE) This CT/NG assay has not been evaluated in patients with a history of  hysterectomy. Performed at [facility], [address] [place], [place] [address]   Culture, blood (routine x 2)     Status: None (Preliminary result)   Collection Time: 05/31/24  3:38 PM   Specimen: BLOOD  Result Value Ref Range Status   Specimen Description BLOOD BLOOD RIGHT ARM  Final   Special Requests   Final    BOTTLES DRAWN AEROBIC AND ANAEROBIC Blood Culture results may not be optimal due to an inadequate volume of blood received in culture bottles   Culture   Final    NO GROWTH 3 DAYS Performed at [facility], [address] [place], [place] [address]    Report Status PENDING  Incomplete  Culture, blood (routine x 2)     Status: None (Preliminary result)   Collection Time: 05/31/24  3:46 PM   Specimen: BLOOD  Result Value Ref Range Status   Specimen Description BLOOD LEFT HAND  Final   Special Requests   Final    BOTTLES DRAWN AEROBIC AND ANAEROBIC Blood Culture results may not be optimal due to an inadequate volume of blood received in culture bottles   Culture   Final    NO GROWTH 3 DAYS Performed at [facility], [address] [place], [place] [address]    Report Status PENDING  Incomplete    Coagulation Studies: Recent Labs    05/31/24 12-Jul-2150  LABPROT 16.1*  INR 1.2    Urinalysis: Recent Labs    05/31/24 1458  COLORURINE AMBER*  LABSPEC 1.010  PHURINE 7.0  GLUCOSEU 50*  HGBUR LARGE*  BILIRUBINUR NEGATIVE  KETONESUR NEGATIVE  PROTEINUR >=300*  NITRITE NEGATIVE  LEUKOCYTESUR TRACE*      Imaging: US  RENAL Result Date: 06/01/2024 EXAM: RETROPERITONEAL ULTRASOUND OF THE KIDNEYS 06/01/2024 02:54:35 PM TECHNIQUE: Real-time ultrasonography of the retroperitoneum, specifically the kidneys and urinary bladder, was performed. COMPARISON: January 2026, May 05 2024. CLINICAL HISTORY: Gross  hematuria. FINDINGS: RIGHT KIDNEY: Right kidney measures 12.2 x 6.1 x 6.2 cm. Increased echogenicity of the kidneys. Small lower pole cyst measuring 1 cm. No hydronephrosis. No calculus. No mass. LEFT KIDNEY: Left kidney measures 12.6 x 7.2 x 6.1 cm. Increased echogenicity of the kidneys. No hydronephrosis. No calculus. No mass. BLADDER: Circumferential wall thickening of the urinary bladder, which is decompressed. IMPRESSION: 1. Circumferential wall thickening of the decompressed urinary bladder. If there is concern for acute  cystitis, correlation with urinalysis will be recommended. 2. No hydronephrosis or nephrolithiasis. 3. Increased echogenicity of both kidneys, which can be seen in both acute and chronic medical renal disease. Electronically signed by: Rogelia Myers MD 06/01/2024 03:06 PM EST RP Workstation: [id number]     Medications:    piperacillin -tazobactam (ZOSYN )  IV 3.375 g (06/03/24 0509)    amLODipine   10 mg Oral Daily   doxycycline   100 mg Oral Q12H   enoxaparin  (LOVENOX ) injection  30 mg Subcutaneous Q24H   fluticasone  furoate-vilanterol  1 puff Inhalation Daily   folic acid   1 mg Oral Daily   metoprolol  tartrate  25 mg Oral BID   nicotine   21 mg Transdermal Daily   tamsulosin   0.4 mg Oral QPC breakfast   thiamine   100 mg Oral Daily   acetaminophen , albuterol , dextromethorphan -guaiFENesin , hydrALAZINE , lactulose , metoprolol  tartrate, morphine  injection, ondansetron  (ZOFRAN ) IV, oxyCODONE   Assessment/ Plan:  Edward Macias is a 64 y.o.  male with past medical conditions to include hypertension, asthma, COPD, history of alcohol abuse in the past, hepatitis C, cirrhosis, diverticulosis, and aortic atherosclerosis. Patient presents to ED with Scrotal pain and has been admitted for SVT (supraventricular tachycardia) [I47.10] Acute diverticulitis [K57.92] Left lower quadrant abdominal pain [R10.32] Acute diverticulitis of intestine [K57.92]   Acute Kidney Injury with  hematuria/proteinuria on chronic kidney disease stage IIIb with baseline creatinine 1.7 and GFR of 44 on 05/02/24. Normal function in November 2025. Acute kidney injury secondary to multiple recent IV contrast exposures and recent pyelonephritis. Current renal function coincides with latest hospitalization. Renal ultrasound suspicious for cystitis. UA has hematuria and proteinuria present. Appreciate urology consult and the will follow patient at discharge for outpatient cystoscopy.  Renal function remains stable. Continue to treat infection with prescribed antibiotics. Avoid nephrotoxic agents and therapies.   Lab Results  Component Value Date   CREATININE 3.24 (H) 06/03/2024   CREATININE 3.53 (H) 06/02/2024   CREATININE 3.31 (H) 06/01/2024    Intake/Output Summary (Last 24 hours) at 06/03/2024 1308 Last data filed at 06/03/2024 0951 Gross per 24 hour  Intake 760.49 ml  Output 3260 ml  Net -2499.51 ml   2. Hypertension, essential. Medications include amlodipine , and torsemide . Currently receiving metoprolol  and amlodipine  only.   3. Anemia of chronic kidney disease Lab Results  Component Value Date   HGB 7.8 (L) 06/03/2024    Hgb improved to 7.8. Received blood transfusion yesterday.   4. Acute diverticulitis, seen on Ct abd/pelvis. Receiving zosyn  with plan to transition to oral in am.     LOS: 3 Nehemyah Foushee 1/23/20261:08 PM   "

## 2024-06-03 NOTE — Progress Notes (Signed)
 " Progress Note   Patient: Edward Macias FMW:[id number] DOB: 06/08/1960 DOA: 05/31/2024     3 DOS: the patient was seen and examined on 06/03/2024   Brief hospital course: Nial Hawe is a 64 y.o. male with medical history significant of HTN, COPD/asthma, HCV, recurrent UTI, E. coli septicemia, renal and perinephritic abscess, tobacco abuse, alcohol abuse (not drinking for more than 3 weeks), recent admission due to sepsis secondary to pyelonephritis 12/21 - 12/31, who presents with abdominal pain and testicular pain.  CT scan showed early Doppler colitis in the distal descending and proximal sigmoid colon. Patient was started on Zosyn .   Principal Problem:   Acute diverticulitis Active Problems:   Scrotal pain   SVT (supraventricular tachycardia)   HTN (hypertension)   COPD (chronic obstructive pulmonary disease) (HCC)   Hypokalemia   AKI (acute kidney injury)   Bilateral leg edema   Tobacco abuse   Alcohol use disorder   Hypomagnesemia   Acute diverticulitis of intestine   Assessment and Plan: Acute diverticulitis: CT scan showed early active diverticulitis in the distal descending/proximal sigmoid colon with slight inflammatory stranding. Pt has leukocytosis with WBC 24.1, but no fever, lactic acid normal 1.9, clinically does not seem to have sepsis. Blood cultures pending, patient was placed on Zosyn .  Will continue. Reviewed the chart, patient had a multiple admission with infection over the last 2 months.  HIV was negative in the last admission.  Blood culture negative, leukocytosis improving. Continue Zosyn  for another day, probably discharge home tomorrow with oral antibiotics.   Scrotal pain secondary to epididymitis:  Scrotum ultrasound showed evidence of epididymitis.  Will continue Zosyn  with added doxycycline .  Condition improving.   SVT (supraventricular tachycardia): Etiology is not clear.  Initial heart rate of 170s, converted to sinus rhythm with heart rate 80-90s  after treated with metoprolol . - metoprolol  25 mg twice daily orally - Condition has improved.  Patient can follow-up with cardiology as outpatient.   HTN (hypertension) -On metoprolol  25 mg twice daily as above     COPD (chronic obstructive pulmonary disease) (HCC): Stable -Bronchodilators and as needed Mucinex    Hypokalemia Hypomagnesemia. AKI (acute kidney injury) Gross hematuria: Patient had GFR> 60 and creatinine 0.96 on 03/18/2024.  He had an AKI due to pyelonephritis with creatinine up to 3.95 on 05/06/2024 --> 3.54 on 05/11/2024 . Creatinine 2.94 today.  CT scan of the abdomen/pelvis did not show any hydronephrosis.  Will obtain bladder scan to rule out urinary retention. Repleted potassium and magnesium . Patient also has gross hematuria for the last 2 days, UA's showed large amount of red cells with hemoglobin, urine looks pink in color. Ultrasound consistent with medical renal disease, no hydronephrosis or malignancy. Patient has been seen by nephrology and urology.  Follow-up with urology for outpatient cystoscopy.  No need for fluids per nephrology, probably will discharge home tomorrow if her renal function continue to improve today. Continue replete potassium and magnesium .   Severe anemia. Likely acute blood loss anemia from hematuria. Reactive thrombocytosis. Patient is starting to have anemia since last admission on 12/14, prior to that, hemoglobin was normal.  Hemoglobin further dropped down to 7.2 today, Patient has a lower iron, but elevated ferritin, also normal B12 and folic acid  level. Hemoglobin dropped down to 6.8 today, will transfuse 1 unit of PRBC. Drop her hemoglobin probably due to acute blood loss from hematuria and from renal failure. Hemoglobin today 7.8.  Relatively stable.     Bilateral leg edema:  Patient has  a mild elevation in proBNP associated with creatinine around 3.  No overall volume overload.  Leg edema was due to acute renal failure.   Echocardiogram showed normal ejection fraction with grade 1 diastolic dysfunction.   Tobacco abuse -Nicotine  patch -Counseling about importance of quitting smoking   Alcohol use disorder: Patient states that he did not drink alcohol for more than 3 weeks.  No signs for withdrawal. - give vitamin B1 and folic acid  No evidence of alcohol withdrawal.       Subjective:  Patient doing well today, no short of breath.  No abdominal pain.  Physical Exam: Vitals:   06/03/24 0459 06/03/24 0500 06/03/24 0721 06/03/24 0931  BP: (!) 145/83   137/77  Pulse: 69   79  Resp: 18   18  Temp: 98.6 F (37 C)   98.3 F (36.8 C)  TempSrc:      SpO2: 100%  97% 99%  Weight:  69.1 kg    Height:       General exam: Appears calm and comfortable  Respiratory system: Clear to auscultation. Respiratory effort normal. Cardiovascular system: S1 & S2 heard, RRR. No JVD, murmurs, rubs, gallops or clicks. No pedal edema. Gastrointestinal system: Abdomen is nondistended, soft and nontender. No organomegaly or masses felt. Normal bowel sounds heard. Central nervous system: Alert and oriented. No focal neurological deficits. Extremities: Symmetric 5 x 5 power. Skin: No rashes, lesions or ulcers Psychiatry: Judgement and insight appear normal. Mood & affect appropriate.    Data Reviewed:  Lab results reviewed.  Family Communication: None  Disposition: Status is: Inpatient Remains inpatient appropriate because: Severity of disease.  IV treatment.     Time spent: 35 minutes  Author: Murvin Mana, MD 06/03/2024 11:56 AM  For on call review [email or web address].    "

## 2024-06-04 ENCOUNTER — Other Ambulatory Visit: Payer: Self-pay

## 2024-06-04 DIAGNOSIS — N5082 Scrotal pain: Secondary | ICD-10-CM | POA: Diagnosis not present

## 2024-06-04 DIAGNOSIS — I471 Supraventricular tachycardia, unspecified: Secondary | ICD-10-CM | POA: Diagnosis not present

## 2024-06-04 DIAGNOSIS — K5792 Diverticulitis of intestine, part unspecified, without perforation or abscess without bleeding: Secondary | ICD-10-CM | POA: Diagnosis not present

## 2024-06-04 DIAGNOSIS — N179 Acute kidney failure, unspecified: Secondary | ICD-10-CM | POA: Diagnosis not present

## 2024-06-04 LAB — CBC
HCT: 24.5 % — ABNORMAL LOW (ref 39.0–52.0)
Hemoglobin: 7.9 g/dL — ABNORMAL LOW (ref 13.0–17.0)
MCH: 28.1 pg (ref 26.0–34.0)
MCHC: 32.2 g/dL (ref 30.0–36.0)
MCV: 87.2 fL (ref 80.0–100.0)
Platelets: 468 10*3/uL — ABNORMAL HIGH (ref 150–400)
RBC: 2.81 MIL/uL — ABNORMAL LOW (ref 4.22–5.81)
RDW: 16.3 % — ABNORMAL HIGH (ref 11.5–15.5)
WBC: 11.4 10*3/uL — ABNORMAL HIGH (ref 4.0–10.5)
nRBC: 0 % (ref 0.0–0.2)

## 2024-06-04 LAB — BASIC METABOLIC PANEL WITH GFR
Anion gap: 13 (ref 5–15)
BUN: 22 mg/dL (ref 8–23)
CO2: 22 mmol/L (ref 22–32)
Calcium: 8.7 mg/dL — ABNORMAL LOW (ref 8.9–10.3)
Chloride: 103 mmol/L (ref 98–111)
Creatinine, Ser: 3.42 mg/dL — ABNORMAL HIGH (ref 0.61–1.24)
GFR, Estimated: 19 mL/min — ABNORMAL LOW
Glucose, Bld: 86 mg/dL (ref 70–99)
Potassium: 3.3 mmol/L — ABNORMAL LOW (ref 3.5–5.1)
Sodium: 138 mmol/L (ref 135–145)

## 2024-06-04 LAB — MAGNESIUM: Magnesium: 1.9 mg/dL (ref 1.7–2.4)

## 2024-06-04 MED ORDER — METOPROLOL TARTRATE 25 MG PO TABS
25.0000 mg | ORAL_TABLET | Freq: Two times a day (BID) | ORAL | 0 refills | Status: AC
  Filled 2024-06-04: qty 60, 30d supply, fill #0

## 2024-06-04 MED ORDER — POTASSIUM CHLORIDE CRYS ER 20 MEQ PO TBCR
20.0000 meq | EXTENDED_RELEASE_TABLET | Freq: Every day | ORAL | 0 refills | Status: AC
  Filled 2024-06-04: qty 3, 3d supply, fill #0

## 2024-06-04 MED ORDER — AMOXICILLIN-POT CLAVULANATE 500-125 MG PO TABS
1.0000 | ORAL_TABLET | Freq: Two times a day (BID) | ORAL | 0 refills | Status: AC
  Filled 2024-06-04: qty 14, 7d supply, fill #0

## 2024-06-04 MED ORDER — POTASSIUM CHLORIDE CRYS ER 20 MEQ PO TBCR
40.0000 meq | EXTENDED_RELEASE_TABLET | ORAL | Status: AC
  Administered 2024-06-04 (×2): 40 meq via ORAL
  Filled 2024-06-04 (×2): qty 2

## 2024-06-04 MED ORDER — DOXYCYCLINE HYCLATE 100 MG PO TABS
100.0000 mg | ORAL_TABLET | Freq: Two times a day (BID) | ORAL | 0 refills | Status: AC
  Filled 2024-06-04: qty 8, 4d supply, fill #0

## 2024-06-04 MED ORDER — NICOTINE 21 MG/24HR TD PT24
21.0000 mg | MEDICATED_PATCH | Freq: Every day | TRANSDERMAL | 0 refills | Status: AC
  Filled 2024-06-04: qty 28, 28d supply, fill #0

## 2024-06-04 NOTE — Discharge Summary (Signed)
 " Physician Discharge Summary   Patient: Edward Macias MRN: [id number] DOB: 01-Sep-1960  Admit date:     05/31/2024  Discharge date: 06/04/24  Discharge Physician: Murvin Mana   PCP: Pcp, No   Recommendations at discharge:   Follow-up with nephrology in 1 to 2 weeks. Follow-up with urology in 3 weeks. Follow-up with hematology in 1 week. TOC can set up PCP follow-up as soon as possible. Check a BMP and a CBC at next office visit.  Discharge Diagnoses: Principal Problem:   Acute diverticulitis Active Problems:   Scrotal pain   SVT (supraventricular tachycardia)   HTN (hypertension)   COPD (chronic obstructive pulmonary disease) (HCC)   Hypokalemia   AKI (acute kidney injury)   Bilateral leg edema   Tobacco abuse   Alcohol use disorder   Hypomagnesemia   Acute diverticulitis of intestine  Resolved Problems:   * No resolved hospital problems. *  Hospital Course: [name] is a 64 y.o. male with medical history significant of HTN, COPD/asthma, HCV, recurrent UTI, E. coli septicemia, renal and perinephritic abscess, tobacco abuse, alcohol abuse (not drinking for more than 3 weeks), recent admission due to sepsis secondary to pyelonephritis 12/21 - 12/31, who presents with abdominal pain and testicular pain.  CT scan showed early Doppler colitis in the distal descending and proximal sigmoid colon. Patient was started on Zosyn . Patient condition has improved, abdominal pain resolved.  Medically stable for discharge. Patient has a worsening renal function since December, he did not improve at this admission.  Patient was seen by nephrology, no plan for inpatient biopsy due to active infection.  As a result, patient be followed up with nephrology as outpatient.  Assessment and Plan:  Acute diverticulitis: CT scan showed early active diverticulitis in the distal descending/proximal sigmoid colon with slight inflammatory stranding. Pt has leukocytosis with WBC 24.1, but no fever,  lactic acid normal 1.9, clinically does not seem to have sepsis. Blood cultures pending, patient was placed on Zosyn .  Will continue. Reviewed the chart, patient had a multiple admission with infection over the last 2 months.  HIV was negative in the last admission.  Blood culture negative, leukocytosis improving. Continue Augmentin  to complete 10-day course of antibiotics.  Condition had improved.   Scrotal pain secondary to epididymitis:  Scrotum ultrasound showed evidence of epididymitis.  Condition improved, continue complete 7 days of doxycycline .   SVT (supraventricular tachycardia): Etiology is not clear.  Initial heart rate of 170s, converted to sinus rhythm with heart rate 80-90s after treated with metoprolol . - metoprolol  25 mg twice daily orally - Condition has improved.  Patient does not have recurrence, will refer to Dr. Wilburn to be seen 2 weeks.   HTN (hypertension) -On metoprolol  25 mg and amlodipine .     COPD (chronic obstructive pulmonary disease) (HCC): Stable    Hypokalemia Hypomagnesemia. AKI (acute kidney injury) Gross hematuria: Patient had GFR> 60 and creatinine 0.96 on 03/18/2024.  He had an AKI due to pyelonephritis with creatinine up to 3.95 on 05/06/2024 --> 3.54 on 05/11/2024 . Creatinine 2.94 today.  CT scan of the abdomen/pelvis did not show any hydronephrosis.  Will obtain bladder scan to rule out urinary retention. Repleted potassium and magnesium . Patient also has gross hematuria for the last 2 days, UA's showed large amount of red cells with hemoglobin, urine looks pink in color. Ultrasound consistent with medical renal disease, no hydronephrosis or malignancy. Patient has been seen by nephrology and urology.  Follow-up with urology for outpatient cystoscopy.  No need for fluids per nephrology, discussed with nephrology, patient creatinine level has been fluctuating since December, will need further workup as outpatient.  Due to active infection, patient  will not be able to get a renal biopsy.  He will be followed with nephrology. Potassium is still low, repleted today.  Also give additional 3 days of KCl at 20 mEq daily.   Severe anemia. Likely acute blood loss anemia from hematuria. Reactive thrombocytosis. Patient is starting to have anemia since last admission on 12/14, prior to that, hemoglobin was normal.  Hemoglobin further dropped down to 7.2 today, Patient has a lower iron, but elevated ferritin, also normal B12 and folic acid  level. Hemoglobin dropped down to 6.8 today, will transfuse 1 unit of PRBC. Drop her hemoglobin probably due to acute blood loss from hematuria and from renal failure. Hemoglobin relatively stable, will refer patient to hematology at discharge.     Bilateral leg edema:  Patient has a mild elevation in proBNP associated with creatinine around 3.  No overall volume overload.  Leg edema was due to acute renal failure.  Echocardiogram showed normal ejection fraction with grade 1 diastolic dysfunction.   Tobacco abuse -Nicotine  patch -Counseling about importance of quitting smoking   Alcohol use disorder: Patient states that he did not drink alcohol for more than 3 weeks.  No signs for withdrawal.                Consultants: Urology and nephrology Procedures performed: None  Disposition: Home Diet recommendation:  Discharge Diet Orders (From admission, onward)     Start     Ordered   06/04/24 0000  Diet - low sodium heart healthy        06/04/24 1029           Cardiac diet DISCHARGE MEDICATION: Allergies as of 06/04/2024   No Known Allergies      Medication List     STOP taking these medications    lactulose  10 GM/15ML solution Commonly known as: CHRONULAC    thiamine  100 MG tablet Commonly known as: VITAMIN B1   torsemide  20 MG tablet Commonly known as: DEMADEX        TAKE these medications    acetaminophen  325 MG tablet Commonly known as: TYLENOL  Take 650 mg by mouth  every 6 (six) hours as needed for mild pain (pain score 1-3).   albuterol  108 (90 Base) MCG/ACT inhaler Commonly known as: VENTOLIN  HFA Inhale 2 puffs into the lungs every 6 (six) hours as needed for wheezing or shortness of breath.   amLODipine  10 MG tablet Commonly known as: NORVASC  Take 1 tablet (10 mg total) by mouth daily.   amoxicillin -clavulanate 500-125 MG tablet Commonly known as: Augmentin  Take 1 tablet by mouth in the morning and at bedtime for 7 days.   doxycycline  100 MG tablet Commonly known as: VIBRA -TABS Take 1 tablet (100 mg total) by mouth every 12 (twelve) hours for 4 days.   fluticasone -salmeterol 250-50 MCG/ACT Aepb Commonly known as: ADVAIR  Inhale 1 puff into the lungs daily.   folic acid  1 MG tablet Commonly known as: FOLVITE  Take 1 tablet (1 mg total) by mouth daily.   ipratropium-albuterol  0.5-2.5 (3) MG/3ML Soln Commonly known as: DUONEB Take 3 mLs by nebulization 3 (three) times daily.   metoprolol  tartrate 25 MG tablet Commonly known as: LOPRESSOR  Take 1 tablet (25 mg total) by mouth 2 (two) times daily.   nicotine  21 mg/24hr patch Commonly known as: NICODERM CQ  - dosed in mg/24  hours Place 1 patch (21 mg total) onto the skin daily. Start taking on: June 05, 2024   potassium chloride  SA 20 MEQ tablet Commonly known as: KLOR-CON  M Take 1 tablet (20 mEq total) by mouth daily.   tamsulosin  0.4 MG Caps capsule Commonly known as: FLOMAX  Take 0.4 mg by mouth daily after breakfast.        Follow-up Information     Georganne Penne SAUNDERS, MD Follow up in 3 week(s).   Specialty: Urology Contact information: [address] [place], [address] [place] [address] [phone]         Dennise Capri, MD Follow up in 1 week(s).   Specialty: Nephrology Contact information: [address] [place] [address] [phone]         Babara Call, MD Follow up in 1 week(s).   Specialty: Oncology Contact information: [address] [place] [address] [phone]                Discharge Exam: Fredricka Weights   05/31/24 1412 06/02/24 0500 06/03/24 0500  Weight: 65.8 kg 68.6 kg 69.1 kg   General exam: Appears calm and comfortable  Respiratory system: Clear to auscultation. Respiratory effort normal. Cardiovascular system: S1 & S2 heard, RRR. No JVD, murmurs, rubs, gallops or clicks. No pedal edema. Gastrointestinal system: Abdomen is nondistended, soft and nontender. No organomegaly or masses felt. Normal bowel sounds heard. Central nervous system: Alert and oriented. No focal neurological deficits. Extremities: Symmetric 5 x 5 power. Skin: No rashes, lesions or ulcers Psychiatry: Judgement and insight appear normal. Mood & affect appropriate.    Condition at discharge: fair  The results of significant diagnostics from this hospitalization (including imaging, microbiology, ancillary and laboratory) are listed below for reference.   Imaging Studies: ECHOCARDIOGRAM COMPLETE Result Date: 06/01/2024    ECHOCARDIOGRAM REPORT   Patient Name:   MAHLIK LENN Date of Exam: 06/01/2024 Medical Rec #:  [id number]    Height:       69.0 in Accession #:    [id number]   Weight:       145.0 lb Date of Birth:  Jan 04, 1961   BSA:          1.802 m Patient Age:    63 years     BP:           125/75 mmHg Patient Gender: M            HR:           87 bpm. Exam Location:  [facility] Procedure: 2D Echo, Cardiac Doppler, Color Doppler and Strain Analysis (Both            Spectral and Color Flow Doppler were utilized during procedure). Indications:     CHF  History:         Patient has no prior history of Echocardiogram examinations.                  COPD; Risk Factors:Former Smoker and Hypertension.  Sonographer:     Philomena Daring Referring Phys:  [id number] XILIN NIU Diagnosing Phys: Evalene Lunger MD  Sonographer Comments: Global longitudinal strain was attempted. IMPRESSIONS  1. Left ventricular ejection fraction, by estimation, is 55  to 60%. The left ventricle has normal function. The left ventricle has no regional wall motion abnormalities. Left ventricular diastolic parameters are consistent with Grade I diastolic dysfunction (impaired relaxation). The average left ventricular global longitudinal strain is -13.3 %. The global longitudinal strain is abnormal.  2. Right ventricular systolic function is normal. The right ventricular size is normal.  3. The mitral valve is normal in structure. No evidence of mitral valve regurgitation. No evidence of mitral stenosis.  4. The aortic valve is normal in structure. Aortic valve regurgitation is mild. Aortic valve sclerosis is present, with no evidence of aortic valve stenosis.  5. The inferior vena cava is normal in size with greater than 50% respiratory variability, suggesting right atrial pressure of 3 mmHg. FINDINGS  Left Ventricle: Left ventricular ejection fraction, by estimation, is 55 to 60%. The left ventricle has normal function. The left ventricle has no regional wall motion abnormalities. The average left ventricular global longitudinal strain is -13.3 %. Strain was performed and the global longitudinal strain is abnormal. The left ventricular internal cavity size was normal in size. There is no left ventricular hypertrophy. Left ventricular diastolic parameters are consistent with Grade I diastolic dysfunction (impaired relaxation). Right Ventricle: The right ventricular size is normal. No increase in right ventricular wall thickness. Right ventricular systolic function is normal. Left Atrium: Left atrial size was normal in size. Right Atrium: Right atrial size was normal in size. Pericardium: There is no evidence of pericardial effusion. Mitral Valve: The mitral valve is normal in structure. No evidence of mitral valve regurgitation. No evidence of mitral valve stenosis. Tricuspid Valve: The tricuspid valve is normal in structure. Tricuspid valve regurgitation is mild . No evidence of  tricuspid stenosis. Aortic Valve: The aortic valve is normal in structure. Aortic valve regurgitation is mild. Aortic valve sclerosis is present, with no evidence of aortic valve stenosis. Pulmonic Valve: The pulmonic valve was normal in structure. Pulmonic valve regurgitation is not visualized. No evidence of pulmonic stenosis. Aorta: The aortic root is normal in size and structure. Venous: The inferior vena cava is normal in size with greater than 50% respiratory variability, suggesting right atrial pressure of 3 mmHg. IAS/Shunts: No atrial level shunt detected by color flow Doppler. Additional Comments: 3D was performed not requiring image post processing on an independent workstation and was indeterminate.  LEFT VENTRICLE PLAX 2D LVIDd:         4.40 cm   Diastology LVIDs:         2.90 cm   LV e' medial:    8.05 cm/s LV PW:         1.10 cm   LV E/e' medial:  11.6 LV IVS:        1.00 cm   LV e' lateral:   10.80 cm/s LVOT diam:     2.20 cm   LV E/e' lateral: 8.6 LV SV:         72 LV SV Index:   40        2D Longitudinal Strain LVOT Area:     3.80 cm  2D Strain GLS Avg:     -13.3 %  RIGHT VENTRICLE             IVC RV Basal diam:  4.10 cm     IVC diam: 1.50 cm RV Mid diam:    3.00 cm RV S prime:     10.70 cm/s TAPSE (M-mode): 1.8 cm LEFT ATRIUM             Index        RIGHT ATRIUM           Index LA diam:        3.20 cm 1.78 cm/m   RA Area:  17.30 cm LA Vol (A2C):   66.0 ml 36.62 ml/m  RA Volume:   48.10 ml  26.69 ml/m LA Vol (A4C):   46.3 ml 25.69 ml/m LA Biplane Vol: 58.1 ml 32.24 ml/m  AORTIC VALVE LVOT Vmax:   98.00 cm/s LVOT Vmean:  65.000 cm/s LVOT VTI:    0.190 m  AORTA Ao Root diam: 3.20 cm MITRAL VALVE MV Area (PHT): 3.99 cm     SHUNTS MV Decel Time: 190 msec     Systemic VTI:  0.19 m MV E velocity: 93.30 cm/s   Systemic Diam: 2.20 cm MV A velocity: 116.00 cm/s MV E/A ratio:  0.80 Evalene Lunger MD Electronically signed by Evalene Lunger MD Signature Date/Time: 06/01/2024/3:17:58 PM    Final     US  RENAL Result Date: 06/01/2024 EXAM: RETROPERITONEAL ULTRASOUND OF THE KIDNEYS 06/01/2024 02:54:35 PM TECHNIQUE: Real-time ultrasonography of the retroperitoneum, specifically the kidneys and urinary bladder, was performed. COMPARISON: January 2026, May 05 2024. CLINICAL HISTORY: Gross hematuria. FINDINGS: RIGHT KIDNEY: Right kidney measures 12.2 x 6.1 x 6.2 cm. Increased echogenicity of the kidneys. Small lower pole cyst measuring 1 cm. No hydronephrosis. No calculus. No mass. LEFT KIDNEY: Left kidney measures 12.6 x 7.2 x 6.1 cm. Increased echogenicity of the kidneys. No hydronephrosis. No calculus. No mass. BLADDER: Circumferential wall thickening of the urinary bladder, which is decompressed. IMPRESSION: 1. Circumferential wall thickening of the decompressed urinary bladder. If there is concern for acute cystitis, correlation with urinalysis will be recommended. 2. No hydronephrosis or nephrolithiasis. 3. Increased echogenicity of both kidneys, which can be seen in both acute and chronic medical renal disease. Electronically signed by: Rogelia Myers MD 06/01/2024 03:06 PM EST RP Workstation: [id number]   CT ABDOMEN PELVIS WO CONTRAST Result Date: 05/31/2024 EXAM: CT ABDOMEN AND PELVIS WITHOUT CONTRAST 05/31/2024 05:23:31 PM TECHNIQUE: CT of the abdomen and pelvis was performed without the administration of intravenous contrast. Multiplanar reformatted images are provided for review. Automated exposure control, iterative reconstruction, and/or weight-based adjustment of the mA/kV was utilized to reduce the radiation dose to as low as reasonably achievable. COMPARISON: 05/01/2024 CLINICAL HISTORY: LLQ abdominal pain FINDINGS: LOWER CHEST: Bibasilar atelectasis. Previously seen pleural effusions have resolved. LIVER: The liver is unremarkable. GALLBLADDER AND BILE DUCTS: Gallbladder is unremarkable. No biliary ductal dilatation. SPLEEN: No acute abnormality. PANCREAS: No acute abnormality. ADRENAL  GLANDS: No acute abnormality. KIDNEYS, URETERS AND BLADDER: No stones in the kidneys or ureters. No hydronephrosis. No perinephric or periureteral stranding. Urinary bladder is unremarkable. GI AND BOWEL: Stomach demonstrates no acute abnormality. There is no bowel obstruction. Left colonic diverticulosis. Slight inflammatory stranding around the distal descending / proximal sigmoid colon suggesting early active diverticulitis. PERITONEUM AND RETROPERITONEUM: No ascites. No free air. VASCULATURE: Diffuse aortoiliac atherosclerosis. LYMPH NODES: No lymphadenopathy. REPRODUCTIVE ORGANS: No acute abnormality. BONES AND SOFT TISSUES: No acute osseous abnormality. No focal soft tissue abnormality. IMPRESSION: 1. Early active diverticulitis in the distal descending/proximal sigmoid colon with slight inflammatory stranding. 2. Left colonic diverticulosis. Electronically signed by: Franky Crease MD 05/31/2024 05:30 PM EST RP Workstation: [id number]   US  SCROTUM W/DOPPLER Result Date: 05/31/2024 CLINICAL DATA:  The similar appearance. EXAM: SCROTAL ULTRASOUND DOPPLER ULTRASOUND OF THE TESTICLES TECHNIQUE: Complete ultrasound examination of the testicles, epididymis, and other scrotal structures was performed. Color and spectral Doppler ultrasound were also utilized to evaluate blood flow to the testicles. COMPARISON:  None Available. FINDINGS: Right testicle Measurements: 3.5 x 1.7 x 2.5 cm. No mass or microlithiasis visualized. Doppler: There is normal  vascularity on color doppler examination. Spectral doppler arterial and venous waveforms are normal. Left testicle Measurements:  3.3 x 1.6 x 2.1 cm. No mass or microlithiasis visualized. Doppler: There is normal vascularity on color doppler examination. Spectral doppler arterial and venous waveforms are normal. Right epididymis: Complex right epididymal head cyst measures 2.6 x 1.5 x 1.7 cm. There is slight enlargement of the right epididymis which may be related to  presence of complex cyst. Correlation with clinical exam recommended to exclude epididymitis. Left epididymis: Left epididymal head cyst measures 0.7 x 5.4 x 0.7 cm. Hydrocele:  Trace right hydrocele with low-level echogenic debris. Varicocele:  None visualized. IMPRESSION: 1. Unremarkable testicles. 2. Bilateral epididymal cysts, right greater than left. Slight enlargement of the right epididymis. Clinical correlation recommended to exclude epididymitis. Electronically Signed   By: Vanetta Chou M.D.   On: 05/31/2024 17:27   US  ASCITES (ABDOMEN LIMITED) Result Date: 05/07/2024 CLINICAL DATA:  Ascites EXAM: LIMITED ABDOMEN ULTRASOUND FOR ASCITES TECHNIQUE: Limited ultrasound survey for ascites was performed in all four abdominal quadrants. COMPARISON:  CT abdomen pelvis 05/01/2024 FINDINGS: No abdominal ascites identified. IMPRESSION: No ascites. Electronically Signed   By: Aliene Lloyd M.D.   On: 05/07/2024 18:46    Microbiology: Results for orders placed or performed during the hospital encounter of 05/31/24  Urine Culture     Status: Abnormal   Collection Time: 05/31/24  2:58 PM   Specimen: Urine, Clean Catch  Result Value Ref Range Status   Specimen Description   Final    URINE, CLEAN CATCH Performed at [facility], [address], [place], [place] [address]    Special Requests   Final    NONE Performed at [facility], [address] [place], [place] [address]    Culture MULTIPLE SPECIES PRESENT, SUGGEST RECOLLECTION (A)  Final   Report Status 06/02/2024 FINAL  Final  Chlamydia/NGC rt PCR ([facility] only)     Status: None   Collection Time: 05/31/24  2:58 PM   Specimen: Urine  Result Value Ref Range Status   Specimen source GC/Chlam URINE, RANDOM  Final   Chlamydia Tr NOT DETECTED NOT DETECTED Final   N gonorrhoeae NOT DETECTED NOT DETECTED Final    Comment: (NOTE) This CT/NG assay has not been evaluated in patients with a history of  hysterectomy. Performed  at [facility], [address] [place], [place] [address]   Culture, blood (routine x 2)     Status: None (Preliminary result)   Collection Time: 05/31/24  3:38 PM   Specimen: BLOOD  Result Value Ref Range Status   Specimen Description BLOOD BLOOD RIGHT ARM  Final   Special Requests   Final    BOTTLES DRAWN AEROBIC AND ANAEROBIC Blood Culture results may not be optimal due to an inadequate volume of blood received in culture bottles   Culture   Final    NO GROWTH 4 DAYS Performed at [facility], [address], [place], [place] [address]    Report Status PENDING  Incomplete  Culture, blood (routine x 2)     Status: None (Preliminary result)   Collection Time: 05/31/24  3:46 PM   Specimen: BLOOD  Result Value Ref Range Status   Specimen Description BLOOD LEFT HAND  Final   Special Requests   Final    BOTTLES DRAWN AEROBIC AND ANAEROBIC Blood Culture results may not be optimal due to an inadequate volume of blood received in culture bottles   Culture  Final    NO GROWTH 4 DAYS Performed at [facility], [address] [place], [place] [address]    Report Status PENDING  Incomplete  Urine Culture (for pregnant, neutropenic or urologic patients or patients with an indwelling urinary catheter)     Status: None (Preliminary result)   Collection Time: 06/02/24  2:20 PM   Specimen: Urine, Clean Catch  Result Value Ref Range Status   Specimen Description   Final    URINE, CLEAN CATCH Performed at [facility], [address], [place], [place] [address]    Special Requests   Final    NONE Performed at [facility], [address], [place], [place] [address]    Culture   Final    CULTURE REINCUBATED FOR BETTER GROWTH Performed at [facility], [address], [place], [place] [address]    Report Status PENDING  Incomplete    Labs: CBC: Recent Labs  Lab 05/31/24 1413 06/01/24 0432 06/02/24 0344 06/02/24 1615  06/03/24 0447 06/04/24 0604  WBC 24.1* 21.5* 15.5*  --  12.1* 11.4*  HGB 8.6* 7.2* 6.8* 8.1* 7.8* 7.9*  HCT 26.8* 22.3* 21.3* 25.5* 24.2* 24.5*  MCV 89.3 89.2 89.9  --  88.3 87.2  PLT 545* 494* 458*  --  435* 468*   Basic Metabolic Panel: Recent Labs  Lab 05/31/24 2152 06/01/24 0534 06/01/24 1424 06/02/24 0344 06/03/24 0446 06/03/24 0447 06/04/24 0604  NA  --  139 136 137  --  140 138  K  --  3.0* 4.1 3.5  --  3.1* 3.3*  CL  --  104 99 102  --  104 103  CO2  --  23 21* 23  --  24 22  GLUCOSE  --  85 109* 92  --  121* 86  BUN  --  28* 28* 28*  --  25* 22  CREATININE  --  2.94* 3.31* 3.53*  --  3.24* 3.42*  CALCIUM  --  7.3* 9.1 8.1*  --  8.4* 8.7*  MG 1.2*  --   --  1.7  --  1.4* 1.9  PHOS 4.2  --   --  4.8* 6.3*  --   --    Liver Function Tests: Recent Labs  Lab 05/31/24 1429 06/01/24 1801  AST 25 19  ALT 9 8  ALKPHOS 106 94  BILITOT 0.4 0.3  PROT 8.4* 8.1  ALBUMIN 3.4* 3.2*   CBG: No results for input(s): GLUCAP in the last 168 hours.  Discharge time spent: 35 minutes.  Signed: Murvin Mana, MD [facility] 06/04/2024 "

## 2024-06-04 NOTE — Progress Notes (Signed)
 " [facility]  ROUNDING NOTE   Subjective:   Edward Macias is a 64 y.o. male with past medical conditions to include hypertension, asthma, COPD, history of alcohol abuse in the past, hepatitis C, cirrhosis, diverticulosis, and aortic atherosclerosis. Patient presents to ED with Scrotal pain and has been admitted for SVT (supraventricular tachycardia) [I47.10] Acute diverticulitis [K57.92] Left lower quadrant abdominal pain [R10.32] Acute diverticulitis of intestine [K57.92]  Patient is known to our practice and is followed by Dr Dennise, last appt on 1/13.  Patient eating breakfast. Denies pain. UO appears pink/red tinged.   Objective:  Vital signs in last 24 hours:  Temp:  [98 F (36.7 C)-98.5 F (36.9 C)] 98.5 F (36.9 C) (01/24 0741) Pulse Rate:  [73-83] 75 (01/24 0741) Resp:  [16-18] 16 (01/24 0741) BP: (129-155)/(75-91) 155/86 (01/24 0741) SpO2:  [97 %-100 %] 97 % (01/24 0741)  Weight change:  Filed Weights   05/31/24 1412 06/02/24 0500 06/03/24 0500  Weight: 65.8 kg 68.6 kg 69.1 kg    Intake/Output: I/O last 3 completed shifts: In: 592 [Macias.O.:592] Out: 5485 [Urine:5485]   Intake/Output this shift:  Total I/O In: -  Out: 600 [Urine:600]  Physical Exam: General: NAD, resting comfortably  Head: Normocephalic  Eyes: Anicteric  Lungs:  Clear to auscultation, normal effort  Heart: Regular rate and rhythm  Abdomen:  Soft, nontender  Extremities: No peripheral edema  Neurologic: Awake, alert, conversant  Skin: Warm,dry, no rash  Access: None    Basic Metabolic Panel: Recent Labs  Lab 05/31/24 2152 06/01/24 0534 06/01/24 1424 06/02/24 0344 06/03/24 0446 06/03/24 0447 06/04/24 0604  NA  --  139 136 137  --  140 138  K  --  3.0* 4.1 3.5  --  3.1* 3.3*  CL  --  104 99 102  --  104 103  CO2  --  23 21* 23  --  24 22  GLUCOSE  --  85 109* 92  --  121* 86  BUN  --  28* 28* 28*  --  25* 22  CREATININE  --  2.94* 3.31* 3.53*  --  3.24* 3.42*  CALCIUM   --  7.3* 9.1 8.1*  --  8.4* 8.7*  MG 1.2*  --   --  1.7  --  1.4* 1.9  PHOS 4.2  --   --  4.8* 6.3*  --   --     Liver Function Tests: Recent Labs  Lab 05/31/24 1429 06/01/24 1801  AST 25 19  ALT 9 8  ALKPHOS 106 94  BILITOT 0.4 0.3  PROT 8.4* 8.1  ALBUMIN 3.4* 3.2*   Recent Labs  Lab 05/31/24 1429  LIPASE 57*   No results for input(s): AMMONIA in the last 168 hours.  CBC: Recent Labs  Lab 05/31/24 1413 06/01/24 0432 06/02/24 0344 06/02/24 1615 06/03/24 0447 06/04/24 0604  WBC 24.1* 21.5* 15.5*  --  12.1* 11.4*  HGB 8.6* 7.2* 6.8* 8.1* 7.8* 7.9*  HCT 26.8* 22.3* 21.3* 25.5* 24.2* 24.5*  MCV 89.3 89.2 89.9  --  88.3 87.2  PLT 545* 494* 458*  --  435* 468*    Cardiac Enzymes: No results for input(s): CKTOTAL, CKMB, CKMBINDEX, TROPONINI in the last 168 hours.  BNP: Invalid input(s): POCBNP  CBG: No results for input(s): GLUCAP in the last 168 hours.  Microbiology: Results for orders placed or performed during the hospital encounter of 05/31/24  Urine Culture     Status: Abnormal   Collection  Time: 05/31/24  2:58 PM   Specimen: Urine, Clean Catch  Result Value Ref Range Status   Specimen Description   Final    URINE, CLEAN CATCH Performed at [facility], [address] [place], [place] [address]    Special Requests   Final    NONE Performed at [facility], [address] [place], [place] [address]    Culture MULTIPLE SPECIES PRESENT, SUGGEST RECOLLECTION (A)  Final   Report Status 06/02/2024 FINAL  Final  Chlamydia/NGC rt PCR ([facility] only)     Status: None   Collection Time: 05/31/24  2:58 PM   Specimen: Urine  Result Value Ref Range Status   Specimen source GC/Chlam URINE, RANDOM  Final   Chlamydia Tr NOT DETECTED NOT DETECTED Final   N gonorrhoeae NOT DETECTED NOT DETECTED Final    Comment: (NOTE) This CT/NG assay has not been evaluated in patients with a history of  hysterectomy. Performed at [facility], [address] [place], [place] [address]   Culture, blood (routine x 2)     Status: None (Preliminary result)   Collection Time: 05/31/24  3:38 PM   Specimen: BLOOD  Result Value Ref Range Status   Specimen Description BLOOD BLOOD RIGHT ARM  Final   Special Requests   Final    BOTTLES DRAWN AEROBIC AND ANAEROBIC Blood Culture results may not be optimal due to an inadequate volume of blood received in culture bottles   Culture   Final    NO GROWTH 4 DAYS Performed at [facility], [address], [place], [place] [address]    Report Status PENDING  Incomplete  Culture, blood (routine x 2)     Status: None (Preliminary result)   Collection Time: 05/31/24  3:46 PM   Specimen: BLOOD  Result Value Ref Range Status   Specimen Description BLOOD LEFT HAND  Final   Special Requests   Final    BOTTLES DRAWN AEROBIC AND ANAEROBIC Blood Culture results may not be optimal due to an inadequate volume of blood received in culture bottles   Culture   Final    NO GROWTH 4 DAYS Performed at [facility], [address] [place], [place] [address]    Report Status PENDING  Incomplete    Coagulation Studies: No results for input(s): LABPROT, INR in the last 72 hours.   Urinalysis: No results for input(s): COLORURINE, LABSPEC, PHURINE, GLUCOSEU, HGBUR, BILIRUBINUR, KETONESUR, PROTEINUR, UROBILINOGEN, NITRITE, LEUKOCYTESUR in the last 72 hours.  Invalid input(s): APPERANCEUR     Imaging: No results found.    Medications:    piperacillin -tazobactam (ZOSYN )  IV 3.375 g (06/04/24 0646)    amLODipine   10 mg Oral Daily   doxycycline   100 mg Oral Q12H   enoxaparin  (LOVENOX ) injection  30 mg Subcutaneous Q24H   fluticasone  furoate-vilanterol  1 puff Inhalation Daily   folic acid   1 mg Oral Daily   metoprolol  tartrate  25 mg Oral BID   nicotine   21 mg Transdermal Daily   potassium chloride   40 mEq Oral Q2H   tamsulosin   0.4  mg Oral QPC breakfast   thiamine   100 mg Oral Daily   acetaminophen , albuterol , dextromethorphan -guaiFENesin , hydrALAZINE , lactulose , metoprolol  tartrate, morphine  injection, ondansetron  (ZOFRAN ) IV, oxyCODONE   Assessment/ Plan:  Mr. Edward Macias is a 64 y.o.  male with past medical conditions to include hypertension, asthma, COPD, history of alcohol abuse in the past, hepatitis C, cirrhosis, diverticulosis, and aortic atherosclerosis. Patient presents  to ED with Scrotal pain and has been admitted for SVT (supraventricular tachycardia) [I47.10] Acute diverticulitis [K57.92] Left lower quadrant abdominal pain [R10.32] Acute diverticulitis of intestine [K57.92]   Acute Kidney Injury with hematuria/proteinuria on chronic kidney disease stage IIIb with baseline creatinine 1.7 and GFR of 44 on 05/02/24. Normal function in November 2025. Acute kidney injury secondary to multiple recent IV contrast exposures and recent pyelonephritis. Current renal function coincides with latest hospitalization. Renal ultrasound suspicious for cystitis. UA has hematuria and proteinuria present. Appreciate urology consult and the will follow patient at discharge for outpatient cystoscopy.  Renal function remains stable. Continuing to treat infection with prescribed antibiotics, may switch to PO prior to discharge.   Avoid nephrotoxic agents and therapies.   Lab Results  Component Value Date   CREATININE 3.42 (H) 06/04/2024   CREATININE 3.24 (H) 06/03/2024   CREATININE 3.53 (H) 06/02/2024    Intake/Output Summary (Last 24 hours) at 06/04/2024 0935 Last data filed at 06/04/2024 0808 Gross per 24 hour  Intake 0 ml  Output 3375 ml  Net -3375 ml   2. Hypertension, essential. Medications include amlodipine , and torsemide . Currently receiving metoprolol  and amlodipine  only.   3. Anemia of chronic kidney disease Lab Results  Component Value Date   HGB 7.9 (L) 06/04/2024    Hgb 7.9 today, improved. Patient has  received transfusions this visit.   4. Acute diverticulitis, seen on Ct abd/pelvis. Receiving zosyn  with plan to transition to oral by primary team    LOS: 4 Edward Macias 1/24/20269:35 AM   "

## 2024-06-05 LAB — CULTURE, BLOOD (ROUTINE X 2)
Culture: NO GROWTH
Culture: NO GROWTH

## 2024-06-08 ENCOUNTER — Inpatient Hospital Stay: Attending: Oncology | Admitting: Oncology

## 2024-06-08 ENCOUNTER — Encounter: Payer: Self-pay | Admitting: Oncology

## 2024-06-08 ENCOUNTER — Inpatient Hospital Stay

## 2024-06-08 ENCOUNTER — Ambulatory Visit: Payer: Self-pay | Admitting: Urology

## 2024-06-08 VITALS — BP 158/88 | HR 73 | Temp 97.3°F | Resp 18 | Ht 69.0 in | Wt 145.0 lb

## 2024-06-08 DIAGNOSIS — D72829 Elevated white blood cell count, unspecified: Secondary | ICD-10-CM | POA: Diagnosis not present

## 2024-06-08 DIAGNOSIS — D509 Iron deficiency anemia, unspecified: Secondary | ICD-10-CM | POA: Insufficient documentation

## 2024-06-08 DIAGNOSIS — N289 Disorder of kidney and ureter, unspecified: Secondary | ICD-10-CM | POA: Diagnosis not present

## 2024-06-08 DIAGNOSIS — D75839 Thrombocytosis, unspecified: Secondary | ICD-10-CM | POA: Insufficient documentation

## 2024-06-08 DIAGNOSIS — I10 Essential (primary) hypertension: Secondary | ICD-10-CM | POA: Diagnosis not present

## 2024-06-08 LAB — URINE CULTURE: Culture: 10000 — AB

## 2024-06-08 NOTE — Telephone Encounter (Signed)
 Spoke with Edward Macias in regards to UTI symptoms. Patient states that he is not having any symptoms at this time. Advised that urine culture has resulted but not antibiotics will be prescribed at this time. Patient verbalized understanding of information given.  Andrea Kirks LPN

## 2024-06-08 NOTE — Progress Notes (Signed)
 Patient was having some blood in his urine, but he said that it has cleared up.

## 2024-06-08 NOTE — Progress Notes (Signed)
 " [facility]  [phone] [phone]  ID: Edward Macias OB: 11-May-1961  MR#: [id number]  RDW#:[id number]  Patient Care Team: Pcp, No as PCP - General Jacobo Evalene PARAS, MD as Consulting Physician (Oncology)  CHIEF COMPLAINT: Iron deficiency anemia.  INTERVAL HISTORY: Patient is a 64 year old male recently admitted the hospital for diverticulitis and anemia requiring 1 unit packed red blood cells.  He was also noted to have significantly reduced iron stores.  He currently feels well and is back to his baseline.  He does not complain of weakness or fatigue today.  He has no neurologic complaints.  He denies any recent fevers.  He has a good appetite and denies weight loss.  He has no chest pain, shortness of breath, cough, or hemoptysis.  He denies any nausea, vomiting, constipation, or diarrhea.  He denies any melena or hematochezia.  He denies any further hematuria or urinary complaints.  Patient offers no further specific complaints today.  REVIEW OF SYSTEMS:   Review of Systems  Constitutional: Negative.  Negative for fever, malaise/fatigue and weight loss.  Respiratory: Negative.  Negative for cough, hemoptysis and shortness of breath.   Cardiovascular: Negative.  Negative for chest pain and leg swelling.  Gastrointestinal: Negative.  Negative for abdominal pain, blood in stool and melena.  Genitourinary: Negative.  Negative for dysuria and hematuria.  Musculoskeletal: Negative.  Negative for back pain.  Skin: Negative.  Negative for rash.  Neurological: Negative.  Negative for dizziness, focal weakness, weakness and headaches.  Psychiatric/Behavioral: Negative.  The patient is not nervous/anxious.     As per HPI. Otherwise, a complete review of systems is negative.  PAST MEDICAL HISTORY: Past Medical History:  Diagnosis Date   Arthritis    Asthma    AS A CHILD-NO INHALERS   Asthma exacerbation 04/09/2019   COPD (chronic obstructive  pulmonary disease) (HCC)    Pneumonia    Sepsis (HCC) 07/24/2018    PAST SURGICAL HISTORY: Past Surgical History:  Procedure Laterality Date   INGUINAL HERNIA REPAIR Left 10/03/2016   Procedure: HERNIA REPAIR INGUINAL ADULT;  Surgeon: Claudene Larinda Bolder, MD;  Location: [facility] ORS;  Service: General;  Laterality: Left;   INSERTION OF MESH Bilateral 03/19/2022   Procedure: INSERTION OF MESH;  Surgeon: Lane Shope, MD;  Location: [facility] ORS;  Service: General;  Laterality: Bilateral;    FAMILY HISTORY: Family History  Problem Relation Age of Onset   Pancreatic cancer Sister    Pancreatic cancer Brother     ADVANCED DIRECTIVES (Y/N):  N  HEALTH MAINTENANCE: Social History[1]   Colonoscopy:  PAP:  Bone density:  Lipid panel:  Allergies[2]  Current Outpatient Medications  Medication Sig Dispense Refill   acetaminophen  (TYLENOL ) 325 MG tablet Take 650 mg by mouth every 6 (six) hours as needed for mild pain (pain score 1-3).     albuterol  (VENTOLIN  HFA) 108 (90 Base) MCG/ACT inhaler Inhale 2 puffs into the lungs every 6 (six) hours as needed for wheezing or shortness of breath. 8 g 1   amLODipine  (NORVASC ) 10 MG tablet Take 1 tablet (10 mg total) by mouth daily. 30 tablet 1   amoxicillin -clavulanate (AUGMENTIN ) 500-125 MG tablet Take 1 tablet by mouth twice daily for 7 days. 14 tablet 0   doxycycline  (VIBRA -TABS) 100 MG tablet Take 1 tablet (100 mg total) by mouth every 12 (twelve) hours for 4 days. 8 tablet 0   fluticasone -salmeterol (ADVAIR ) 250-50 MCG/ACT AEPB Inhale 1 puff into the lungs daily. 60 each  0   folic acid  (FOLVITE ) 1 MG tablet Take 1 tablet (1 mg total) by mouth daily. 30 tablet 0   ipratropium-albuterol  (DUONEB) 0.5-2.5 (3) MG/3ML SOLN Take 3 mLs by nebulization 3 (three) times daily. 360 mL 1   metoprolol  tartrate (LOPRESSOR ) 25 MG tablet Take 1 tablet (25 mg total) by mouth 2 (two) times daily. 60 tablet 0   nicotine  (NICODERM CQ  - DOSED IN MG/24 HOURS) 21  mg/24hr patch Place 1 patch (21 mg total) onto the skin daily. 28 patch 0   potassium chloride  SA (KLOR-CON  M) 20 MEQ tablet Take 1 tablet (20 mEq total) by mouth daily. 3 tablet 0   tamsulosin  (FLOMAX ) 0.4 MG CAPS capsule Take 0.4 mg by mouth daily after breakfast.     No current facility-administered medications for this visit.    OBJECTIVE: Vitals:   06/08/24 1417 06/08/24 1421  BP: (!) 180/89 (!) 158/88  Pulse: 73   Resp: 18   Temp: (!) 97.3 F (36.3 C)   SpO2: 100%      Body mass index is 21.41 kg/m.    ECOG FS:0 - Asymptomatic  General: Well-developed, well-nourished, no acute distress. Eyes: Pink conjunctiva, anicteric sclera. HEENT: Normocephalic, moist mucous membranes. Lungs: No audible wheezing or coughing. Heart: Regular rate and rhythm. Abdomen: Soft, nontender, no obvious distention. Musculoskeletal: No edema, cyanosis, or clubbing. Neuro: Alert, answering all questions appropriately. Cranial nerves grossly intact. Skin: No rashes or petechiae noted. Psych: Normal affect. Lymphatics: No cervical, calvicular, axillary or inguinal LAD.   LAB RESULTS:  Lab Results  Component Value Date   NA 138 06/04/2024   K 3.3 (L) 06/04/2024   CL 103 06/04/2024   CO2 22 06/04/2024   GLUCOSE 86 06/04/2024   BUN 22 06/04/2024   CREATININE 3.42 (H) 06/04/2024   CALCIUM 8.7 (L) 06/04/2024   PROT 8.1 06/01/2024   ALBUMIN 3.2 (L) 06/01/2024   AST 19 06/01/2024   ALT 8 06/01/2024   ALKPHOS 94 06/01/2024   BILITOT 0.3 06/01/2024   GFRNONAA 19 (L) 06/04/2024   GFRAA 108 12/07/2019    Lab Results  Component Value Date   WBC 11.4 (H) 06/04/2024   NEUTROABS 23.6 (H) 05/01/2024   HGB 7.9 (L) 06/04/2024   HCT 24.5 (L) 06/04/2024   MCV 87.2 06/04/2024   PLT 468 (H) 06/04/2024   Lab Results  Component Value Date   IRON 13 (L) 06/01/2024   TIBC 151 (L) 06/01/2024   IRONPCTSAT 8 (L) 06/01/2024   Lab Results  Component Value Date   FERRITIN 611 (H) 06/01/2024      STUDIES: ECHOCARDIOGRAM COMPLETE Result Date: 06/01/2024    ECHOCARDIOGRAM REPORT   Patient Name:   Edward Macias Date of Exam: 06/01/2024 Medical Rec #:  [id number]    Height:       69.0 in Accession #:    [id number]   Weight:       145.0 lb Date of Birth:  Feb 07, 1961   BSA:          1.802 m Patient Age:    63 years     BP:           125/75 mmHg Patient Gender: M            HR:           87 bpm. Exam Location:  [facility] Procedure: 2D Echo, Cardiac Doppler, Color Doppler and Strain Analysis (Both            Spectral  and Color Flow Doppler were utilized during procedure). Indications:     CHF  History:         Patient has no prior history of Echocardiogram examinations.                  COPD; Risk Factors:Former Smoker and Hypertension.  Sonographer:     Philomena Daring Referring Phys:  [id number] XILIN NIU Diagnosing Phys: Evalene Lunger MD  Sonographer Comments: Global longitudinal strain was attempted. IMPRESSIONS  1. Left ventricular ejection fraction, by estimation, is 55 to 60%. The left ventricle has normal function. The left ventricle has no regional wall motion abnormalities. Left ventricular diastolic parameters are consistent with Grade I diastolic dysfunction (impaired relaxation). The average left ventricular global longitudinal strain is -13.3 %. The global longitudinal strain is abnormal.  2. Right ventricular systolic function is normal. The right ventricular size is normal.  3. The mitral valve is normal in structure. No evidence of mitral valve regurgitation. No evidence of mitral stenosis.  4. The aortic valve is normal in structure. Aortic valve regurgitation is mild. Aortic valve sclerosis is present, with no evidence of aortic valve stenosis.  5. The inferior vena cava is normal in size with greater than 50% respiratory variability, suggesting right atrial pressure of 3 mmHg. FINDINGS  Left Ventricle: Left ventricular ejection fraction, by estimation, is 55 to 60%. The left ventricle has normal  function. The left ventricle has no regional wall motion abnormalities. The average left ventricular global longitudinal strain is -13.3 %. Strain was performed and the global longitudinal strain is abnormal. The left ventricular internal cavity size was normal in size. There is no left ventricular hypertrophy. Left ventricular diastolic parameters are consistent with Grade I diastolic dysfunction (impaired relaxation). Right Ventricle: The right ventricular size is normal. No increase in right ventricular wall thickness. Right ventricular systolic function is normal. Left Atrium: Left atrial size was normal in size. Right Atrium: Right atrial size was normal in size. Pericardium: There is no evidence of pericardial effusion. Mitral Valve: The mitral valve is normal in structure. No evidence of mitral valve regurgitation. No evidence of mitral valve stenosis. Tricuspid Valve: The tricuspid valve is normal in structure. Tricuspid valve regurgitation is mild . No evidence of tricuspid stenosis. Aortic Valve: The aortic valve is normal in structure. Aortic valve regurgitation is mild. Aortic valve sclerosis is present, with no evidence of aortic valve stenosis. Pulmonic Valve: The pulmonic valve was normal in structure. Pulmonic valve regurgitation is not visualized. No evidence of pulmonic stenosis. Aorta: The aortic root is normal in size and structure. Venous: The inferior vena cava is normal in size with greater than 50% respiratory variability, suggesting right atrial pressure of 3 mmHg. IAS/Shunts: No atrial level shunt detected by color flow Doppler. Additional Comments: 3D was performed not requiring image post processing on an independent workstation and was indeterminate.  LEFT VENTRICLE PLAX 2D LVIDd:         4.40 cm   Diastology LVIDs:         2.90 cm   LV e' medial:    8.05 cm/s LV PW:         1.10 cm   LV E/e' medial:  11.6 LV IVS:        1.00 cm   LV e' lateral:   10.80 cm/s LVOT diam:     2.20 cm   LV  E/e' lateral: 8.6 LV SV:         72 LV  SV Index:   40        2D Longitudinal Strain LVOT Area:     3.80 cm  2D Strain GLS Avg:     -13.3 %  RIGHT VENTRICLE             IVC RV Basal diam:  4.10 cm     IVC diam: 1.50 cm RV Mid diam:    3.00 cm RV S prime:     10.70 cm/s TAPSE (M-mode): 1.8 cm LEFT ATRIUM             Index        RIGHT ATRIUM           Index LA diam:        3.20 cm 1.78 cm/m   RA Area:     17.30 cm LA Vol (A2C):   66.0 ml 36.62 ml/m  RA Volume:   48.10 ml  26.69 ml/m LA Vol (A4C):   46.3 ml 25.69 ml/m LA Biplane Vol: 58.1 ml 32.24 ml/m  AORTIC VALVE LVOT Vmax:   98.00 cm/s LVOT Vmean:  65.000 cm/s LVOT VTI:    0.190 m  AORTA Ao Root diam: 3.20 cm MITRAL VALVE MV Area (PHT): 3.99 cm     SHUNTS MV Decel Time: 190 msec     Systemic VTI:  0.19 m MV E velocity: 93.30 cm/s   Systemic Diam: 2.20 cm MV A velocity: 116.00 cm/s MV E/A ratio:  0.80 Evalene Lunger MD Electronically signed by Evalene Lunger MD Signature Date/Time: 06/01/2024/3:17:58 PM    Final    US  RENAL Result Date: 06/01/2024 EXAM: RETROPERITONEAL ULTRASOUND OF THE KIDNEYS 06/01/2024 02:54:35 PM TECHNIQUE: Real-time ultrasonography of the retroperitoneum, specifically the kidneys and urinary bladder, was performed. COMPARISON: January 2026, May 05 2024. CLINICAL HISTORY: Gross hematuria. FINDINGS: RIGHT KIDNEY: Right kidney measures 12.2 x 6.1 x 6.2 cm. Increased echogenicity of the kidneys. Small lower pole cyst measuring 1 cm. No hydronephrosis. No calculus. No mass. LEFT KIDNEY: Left kidney measures 12.6 x 7.2 x 6.1 cm. Increased echogenicity of the kidneys. No hydronephrosis. No calculus. No mass. BLADDER: Circumferential wall thickening of the urinary bladder, which is decompressed. IMPRESSION: 1. Circumferential wall thickening of the decompressed urinary bladder. If there is concern for acute cystitis, correlation with urinalysis will be recommended. 2. No hydronephrosis or nephrolithiasis. 3. Increased echogenicity of  both kidneys, which can be seen in both acute and chronic medical renal disease. Electronically signed by: Rogelia Myers MD 06/01/2024 03:06 PM EST RP Workstation: [id number]   CT ABDOMEN PELVIS WO CONTRAST Result Date: 05/31/2024 EXAM: CT ABDOMEN AND PELVIS WITHOUT CONTRAST 05/31/2024 05:23:31 PM TECHNIQUE: CT of the abdomen and pelvis was performed without the administration of intravenous contrast. Multiplanar reformatted images are provided for review. Automated exposure control, iterative reconstruction, and/or weight-based adjustment of the mA/kV was utilized to reduce the radiation dose to as low as reasonably achievable. COMPARISON: 05/01/2024 CLINICAL HISTORY: LLQ abdominal pain FINDINGS: LOWER CHEST: Bibasilar atelectasis. Previously seen pleural effusions have resolved. LIVER: The liver is unremarkable. GALLBLADDER AND BILE DUCTS: Gallbladder is unremarkable. No biliary ductal dilatation. SPLEEN: No acute abnormality. PANCREAS: No acute abnormality. ADRENAL GLANDS: No acute abnormality. KIDNEYS, URETERS AND BLADDER: No stones in the kidneys or ureters. No hydronephrosis. No perinephric or periureteral stranding. Urinary bladder is unremarkable. GI AND BOWEL: Stomach demonstrates no acute abnormality. There is no bowel obstruction. Left colonic diverticulosis. Slight inflammatory stranding around the distal descending / proximal sigmoid colon suggesting early active diverticulitis.  PERITONEUM AND RETROPERITONEUM: No ascites. No free air. VASCULATURE: Diffuse aortoiliac atherosclerosis. LYMPH NODES: No lymphadenopathy. REPRODUCTIVE ORGANS: No acute abnormality. BONES AND SOFT TISSUES: No acute osseous abnormality. No focal soft tissue abnormality. IMPRESSION: 1. Early active diverticulitis in the distal descending/proximal sigmoid colon with slight inflammatory stranding. 2. Left colonic diverticulosis. Electronically signed by: Franky Crease MD 05/31/2024 05:30 PM EST RP Workstation: [id number]   US   SCROTUM W/DOPPLER Result Date: 05/31/2024 CLINICAL DATA:  The similar appearance. EXAM: SCROTAL ULTRASOUND DOPPLER ULTRASOUND OF THE TESTICLES TECHNIQUE: Complete ultrasound examination of the testicles, epididymis, and other scrotal structures was performed. Color and spectral Doppler ultrasound were also utilized to evaluate blood flow to the testicles. COMPARISON:  None Available. FINDINGS: Right testicle Measurements: 3.5 x 1.7 x 2.5 cm. No mass or microlithiasis visualized. Doppler: There is normal vascularity on color doppler examination. Spectral doppler arterial and venous waveforms are normal. Left testicle Measurements:  3.3 x 1.6 x 2.1 cm. No mass or microlithiasis visualized. Doppler: There is normal vascularity on color doppler examination. Spectral doppler arterial and venous waveforms are normal. Right epididymis: Complex right epididymal head cyst measures 2.6 x 1.5 x 1.7 cm. There is slight enlargement of the right epididymis which may be related to presence of complex cyst. Correlation with clinical exam recommended to exclude epididymitis. Left epididymis: Left epididymal head cyst measures 0.7 x 5.4 x 0.7 cm. Hydrocele:  Trace right hydrocele with low-level echogenic debris. Varicocele:  None visualized. IMPRESSION: 1. Unremarkable testicles. 2. Bilateral epididymal cysts, right greater than left. Slight enlargement of the right epididymis. Clinical correlation recommended to exclude epididymitis. Electronically Signed   By: Vanetta Chou M.D.   On: 05/31/2024 17:27    ASSESSMENT: Iron deficiency anemia.  PLAN:    Iron deficiency anemia: Patient's hemoglobin improved to 7.9 with transfusion in the hospital, but his iron stores remain reduced.  Patient will benefit IV iron.  Return to clinic 5 times over the next 1 to 2 weeks to receive 200 mg IV Venofer.  Patient will then return to clinic in 3 months with repeat laboratory, further evaluation, and continuation of treatment if  needed. Thrombocytosis: Likely reactive secondary to iron deficiency.  IV iron as above. Elevated ferritin level: Likely secondary to acute phase reactant.  The remainder of his iron panel is decreased. Leukocytosis: Likely reactive, monitor. Renal insufficiency: Patient's most recent GFR is 19.  He may benefit from Retacrit in the future once his iron stores are replaced.  Continue follow-up with nephrology as scheduled. Hypertension: Patient's blood pressure is significantly elevated today.  Continue monitoring and treatment per primary care.  I spent a total of 45 minutes reviewing chart data, face-to-face evaluation with the patient, counseling and coordination of care as detailed above.  Patient expressed understanding and was in agreement with this plan. He also understands that He can call clinic at any time with any questions, concerns, or complaints.    Evalene JINNY Reusing, MD   06/08/2024 2:52 PM        [1]  Social History Tobacco Use   Smoking status: Every Day    Current packs/day: 1.00    Average packs/day: 1 pack/day for 39.0 years (39.0 ttl pk-yrs)    Types: Cigarettes   Smokeless tobacco: Never  Vaping Use   Vaping status: Never Used  Substance Use Topics   Alcohol use: Yes    Comment: occasional   Drug use: No  [2] No Known Allergies  "

## 2024-06-08 NOTE — Telephone Encounter (Signed)
-----   Message from [facility] sent at 06/08/2024 12:48 PM EST ----- Would you call Edward Macias and see if he is having any UTI symptoms at this time?  His second urine culture from the hospital has resulted, but I do not want to prescribe more antibiotics if he has  recovered.

## 2024-06-12 ENCOUNTER — Telehealth: Payer: Self-pay | Admitting: Oncology

## 2024-06-12 NOTE — Telephone Encounter (Signed)
 Called patient to reschedule 2/2 infusion appointment- voicemail full . Not able to leave message. Changed appointment to after 2/13 already scheduled appointment

## 2024-06-13 ENCOUNTER — Inpatient Hospital Stay

## 2024-06-16 ENCOUNTER — Inpatient Hospital Stay

## 2024-06-16 VITALS — BP 178/91 | HR 73 | Temp 98.1°F | Resp 18

## 2024-06-16 DIAGNOSIS — D509 Iron deficiency anemia, unspecified: Secondary | ICD-10-CM

## 2024-06-16 MED ORDER — IRON SUCROSE 20 MG/ML IV SOLN
200.0000 mg | Freq: Once | INTRAVENOUS | Status: AC
  Administered 2024-06-16: 200 mg via INTRAVENOUS
  Filled 2024-06-16: qty 10

## 2024-06-16 MED ORDER — SODIUM CHLORIDE 0.9% FLUSH
10.0000 mL | Freq: Once | INTRAVENOUS | Status: AC | PRN
  Administered 2024-06-16: 10 mL
  Filled 2024-06-16: qty 10

## 2024-06-16 NOTE — Progress Notes (Unsigned)
" ° °  06/29/2024 4:13 PM   Edward Macias 02/11/1961 [id number]  Cystoscopy Procedure Note:  Indication:  Hx of pyelonephritis, perinephric abscess Hx of recent GH + diverticulitis admission  Negative RBUS, CT non con Circumferential bladder thickening Ucx 06/02/24 - 10k E Faecium  After informed consent and discussion of the procedure and its risks, Edward Macias was positioned and prepped in the standard fashion. Cystoscopy was performed with a flexible cystoscope. The urethra, bladder neck and bladder mucosa were visualized in a systematic fashion. The ureteral orifices were noted in orthotopic location and orientation. There were no bladder mucosal lesions, stones, debris or anatomic variants noted. The prostate gland was {[id number]}.  Imaging: Recent Negative RBUS, CT non con - negative  Findings: ***  Assessment and Plan: ***  Penne Skye, MD 06/16/2024   "

## 2024-06-20 ENCOUNTER — Inpatient Hospital Stay

## 2024-06-22 ENCOUNTER — Inpatient Hospital Stay

## 2024-06-24 ENCOUNTER — Inpatient Hospital Stay

## 2024-06-27 ENCOUNTER — Inpatient Hospital Stay

## 2024-06-29 ENCOUNTER — Other Ambulatory Visit: Admitting: Urology

## 2024-09-05 ENCOUNTER — Inpatient Hospital Stay

## 2024-09-06 ENCOUNTER — Inpatient Hospital Stay

## 2024-09-06 ENCOUNTER — Inpatient Hospital Stay: Admitting: Oncology
